# Patient Record
Sex: Female | Born: 1945
Health system: Southern US, Community
[De-identification: ages and names within clinical notes are randomized; demographics above are authoritative.]

## PROBLEM LIST (undated history)

## (undated) DIAGNOSIS — E039 Hypothyroidism, unspecified: Secondary | ICD-10-CM

## (undated) DIAGNOSIS — J309 Allergic rhinitis, unspecified: Secondary | ICD-10-CM

## (undated) DIAGNOSIS — K219 Gastro-esophageal reflux disease without esophagitis: Secondary | ICD-10-CM

## (undated) DIAGNOSIS — I4891 Unspecified atrial fibrillation: Secondary | ICD-10-CM

## (undated) DIAGNOSIS — E785 Hyperlipidemia, unspecified: Secondary | ICD-10-CM

## (undated) HISTORY — PX: TONSILLECTOMY: SUR1361

## (undated) HISTORY — PX: APPENDECTOMY: SHX54

## (undated) HISTORY — PX: ABLATION: SHX5711

## (undated) HISTORY — DX: Hyperlipidemia, unspecified: E78.5

## (undated) HISTORY — DX: Gastro-esophageal reflux disease without esophagitis: K21.9

## (undated) HISTORY — DX: Allergic rhinitis, unspecified: J30.9

## (undated) HISTORY — DX: Hypothyroidism, unspecified: E03.9

## (undated) HISTORY — PX: SHOULDER ARTHROSCOPY: SHX128

## (undated) HISTORY — PX: HERNIA REPAIR: SHX51

## (undated) HISTORY — PX: ADENOIDECTOMY: SUR15

## (undated) HISTORY — PX: OTHER SURGICAL HISTORY: SHX169

## (undated) HISTORY — DX: Unspecified atrial fibrillation: I48.91

---

## 2001-05-14 ENCOUNTER — Encounter: Payer: Self-pay | Admitting: Internal Medicine

## 2001-05-14 ENCOUNTER — Ambulatory Visit (HOSPITAL_COMMUNITY): Admission: RE | Admit: 2001-05-14 | Discharge: 2001-05-14 | Payer: Self-pay | Admitting: *Deleted

## 2001-09-13 ENCOUNTER — Ambulatory Visit (HOSPITAL_COMMUNITY): Admission: RE | Admit: 2001-09-13 | Discharge: 2001-09-13 | Payer: Self-pay | Admitting: *Deleted

## 2002-07-15 ENCOUNTER — Encounter: Payer: Self-pay | Admitting: Unknown Physician Specialty

## 2002-07-15 ENCOUNTER — Ambulatory Visit (HOSPITAL_COMMUNITY): Admission: RE | Admit: 2002-07-15 | Discharge: 2002-07-15 | Payer: Self-pay | Admitting: Family Medicine

## 2003-10-13 ENCOUNTER — Ambulatory Visit (HOSPITAL_COMMUNITY): Admission: RE | Admit: 2003-10-13 | Discharge: 2003-10-13 | Payer: Self-pay | Admitting: Family Medicine

## 2005-01-17 ENCOUNTER — Ambulatory Visit (HOSPITAL_COMMUNITY): Admission: RE | Admit: 2005-01-17 | Discharge: 2005-01-17 | Payer: Self-pay | Admitting: Family Medicine

## 2006-03-09 ENCOUNTER — Ambulatory Visit (HOSPITAL_COMMUNITY): Admission: RE | Admit: 2006-03-09 | Discharge: 2006-03-09 | Payer: Self-pay | Admitting: Family Medicine

## 2007-03-19 ENCOUNTER — Ambulatory Visit (HOSPITAL_COMMUNITY): Admission: RE | Admit: 2007-03-19 | Discharge: 2007-03-19 | Payer: Self-pay | Admitting: Family Medicine

## 2007-11-09 DIAGNOSIS — K5732 Diverticulitis of large intestine without perforation or abscess without bleeding: Secondary | ICD-10-CM | POA: Insufficient documentation

## 2008-03-31 ENCOUNTER — Ambulatory Visit (HOSPITAL_COMMUNITY): Admission: RE | Admit: 2008-03-31 | Discharge: 2008-03-31 | Payer: Self-pay | Admitting: Family Medicine

## 2009-04-22 ENCOUNTER — Ambulatory Visit (HOSPITAL_COMMUNITY): Admission: RE | Admit: 2009-04-22 | Discharge: 2009-04-22 | Payer: Self-pay | Admitting: Family Medicine

## 2010-05-03 ENCOUNTER — Ambulatory Visit (HOSPITAL_COMMUNITY): Admission: RE | Admit: 2010-05-03 | Discharge: 2010-05-03 | Payer: Self-pay | Admitting: Family Medicine

## 2011-01-07 ENCOUNTER — Encounter: Payer: Self-pay | Admitting: Family Medicine

## 2011-05-05 NOTE — Procedures (Signed)
[facility]  Patient:    Rebecca Carter, Rebecca Carter Visit Number: [id number] MRN: [id number]          Service Type: END Location: ENDO Attending Physician:  Sabino Gasser Dictated by:   Sabino Gasser, M.D. Admit Date:  09/13/2001                             Procedure Report  PROCEDURE:  Colonoscopy.  ENDOSCOPIST:  Sabino Gasser, M.D.  INDICATIONS:  Rectal bleeding, cancer screening.  ANESTHESIA:  Demerol 80, Versed 8 mg.  DESCRIPTION OF PROCEDURE:  With the patient mildly sedated in the left lateral decubitus position, the Olympus videoscopic colonoscope was inserted in the rectum and passed under direction vision into the cecum, identified by ileocecal valve and appendiceal orifice, both of which were photographed from this point.  The colonoscope was slowly withdrawn, taking circumferential views of the entire colonic mucosa, stopping in the rectum which appeared normal on direct view and showed internal hemorrhoids on retroflexed view. The endoscope was straightened and withdrawn.  The patients vital signs and pulse oximetry remained stable.  The patient tolerated procedure well without apparent complications.  FINDINGS:  Internal hemorrhoids, otherwise negative colonoscopic examination.  PLAN:  Repeat examination in 5-10 years. Dictated by:   Sabino Gasser, M.D. Attending Physician:  Sabino Gasser DD:  09/13/01 TD:  09/13/01 Job: [id number]

## 2011-05-29 ENCOUNTER — Other Ambulatory Visit (HOSPITAL_COMMUNITY): Payer: Self-pay | Admitting: Family Medicine

## 2011-05-29 DIAGNOSIS — Z1231 Encounter for screening mammogram for malignant neoplasm of breast: Secondary | ICD-10-CM

## 2011-06-07 ENCOUNTER — Ambulatory Visit (HOSPITAL_COMMUNITY): Payer: Self-pay

## 2011-07-03 ENCOUNTER — Other Ambulatory Visit (HOSPITAL_COMMUNITY): Payer: Self-pay | Admitting: Family Medicine

## 2011-07-03 DIAGNOSIS — Z1231 Encounter for screening mammogram for malignant neoplasm of breast: Secondary | ICD-10-CM

## 2011-07-11 ENCOUNTER — Ambulatory Visit (HOSPITAL_COMMUNITY)
Admission: RE | Admit: 2011-07-11 | Discharge: 2011-07-11 | Disposition: A | Discharge status: Home / Self Care | Payer: Medicare Other | Source: Ambulatory Visit | Attending: Family Medicine | Admitting: Family Medicine

## 2011-07-11 DIAGNOSIS — Z1231 Encounter for screening mammogram for malignant neoplasm of breast: Secondary | ICD-10-CM | POA: Insufficient documentation

## 2012-07-11 ENCOUNTER — Other Ambulatory Visit (HOSPITAL_COMMUNITY): Payer: Self-pay | Admitting: Family Medicine

## 2012-07-11 DIAGNOSIS — Z1231 Encounter for screening mammogram for malignant neoplasm of breast: Secondary | ICD-10-CM

## 2012-08-01 ENCOUNTER — Ambulatory Visit (HOSPITAL_COMMUNITY)
Admission: RE | Admit: 2012-08-01 | Discharge: 2012-08-01 | Disposition: A | Discharge status: Home / Self Care | Payer: Medicare Other | Source: Ambulatory Visit | Attending: Family Medicine | Admitting: Family Medicine

## 2012-08-01 DIAGNOSIS — Z1231 Encounter for screening mammogram for malignant neoplasm of breast: Secondary | ICD-10-CM

## 2013-08-04 ENCOUNTER — Other Ambulatory Visit (HOSPITAL_COMMUNITY): Payer: Self-pay | Admitting: Nurse Practitioner

## 2013-08-04 DIAGNOSIS — Z1231 Encounter for screening mammogram for malignant neoplasm of breast: Secondary | ICD-10-CM

## 2013-08-06 ENCOUNTER — Ambulatory Visit (HOSPITAL_COMMUNITY)
Admission: RE | Admit: 2013-08-06 | Discharge: 2013-08-06 | Disposition: A | Discharge status: Home / Self Care | Payer: Medicare Other | Source: Ambulatory Visit | Attending: Nurse Practitioner | Admitting: Nurse Practitioner

## 2013-08-06 DIAGNOSIS — Z1231 Encounter for screening mammogram for malignant neoplasm of breast: Secondary | ICD-10-CM | POA: Insufficient documentation

## 2013-08-22 ENCOUNTER — Telehealth: Payer: Self-pay | Admitting: Hematology & Oncology

## 2013-08-22 NOTE — Telephone Encounter (Signed)
Left pt message to call for appointment °

## 2013-08-22 NOTE — Telephone Encounter (Signed)
Pt aware of 09-22-13 appointment

## 2013-09-22 ENCOUNTER — Ambulatory Visit (HOSPITAL_BASED_OUTPATIENT_CLINIC_OR_DEPARTMENT_OTHER): Payer: Medicare Other | Admitting: Hematology & Oncology

## 2013-09-22 ENCOUNTER — Ambulatory Visit: Payer: Medicare Other

## 2013-09-22 ENCOUNTER — Ambulatory Visit (HOSPITAL_BASED_OUTPATIENT_CLINIC_OR_DEPARTMENT_OTHER): Payer: Medicare Other

## 2013-09-22 ENCOUNTER — Ambulatory Visit (HOSPITAL_BASED_OUTPATIENT_CLINIC_OR_DEPARTMENT_OTHER): Payer: Medicare Other | Admitting: Lab

## 2013-09-22 VITALS — BP 126/53 | HR 64 | Temp 98.2°F | Resp 14 | Ht 69.0 in | Wt 161.0 lb

## 2013-09-22 DIAGNOSIS — D72819 Decreased white blood cell count, unspecified: Secondary | ICD-10-CM

## 2013-09-22 DIAGNOSIS — D696 Thrombocytopenia, unspecified: Secondary | ICD-10-CM

## 2013-09-22 DIAGNOSIS — Z23 Encounter for immunization: Secondary | ICD-10-CM

## 2013-09-22 LAB — CBC WITH DIFFERENTIAL (CANCER CENTER ONLY)
BASO#: 0 10*3/uL (ref 0.0–0.2)
Eosinophils Absolute: 0 10*3/uL (ref 0.0–0.5)
HGB: 13.3 g/dL (ref 11.6–15.9)
MCH: 32.3 pg (ref 26.0–34.0)
MONO%: 10.8 % (ref 0.0–13.0)
NEUT#: 3.9 10*3/uL (ref 1.5–6.5)
RBC: 4.12 10*6/uL (ref 3.70–5.32)

## 2013-09-22 MED ORDER — INFLUENZA VAC SPLIT QUAD 0.5 ML IM SUSP
0.5000 mL | Freq: Once | INTRAMUSCULAR | Status: AC
  Administered 2013-09-22: 0.5 mL via INTRAMUSCULAR
  Filled 2013-09-22: qty 0.5

## 2013-09-22 NOTE — Progress Notes (Signed)
This office note has been dictated.

## 2013-09-23 NOTE — Progress Notes (Signed)
CC:   Duayne Cal, FNP, Fax [phone]  DIAGNOSIS:  Transient leukopenia and thrombocytopenia.  HISTORY OF PRESENT ILLNESS:  Ms. Ransome is very nice 67 year old white female.  She has been in pretty decent health.  She has history of hypothyroidism.  There is also a history of hyperlipidemia and hypertension.  She worked as a Academic librarian for 30 years.  She does have some anxiety.  She is followed at [facility] in [place].  She had some routine lab work done.  This was done in August.  The lab work showed that she had some leukopenia and thrombocytopenia.  CBC showed a white cell count of 3, hemoglobin of 12.2, hematocrit 36.1, and platelet count was 134.  She had white cell differential with 44% segs, 44% lymphs, 10 monos.  Because of this, her nurse practitioner felt that a hematologic evaluation was needed.  Ms. __________ says that there has been no change in her medications. She has had no problem with infections.  She has had no rashes.  She is not a vegetarian.  Her appetite has been good.  There has been no weight loss or weight gain.  She has had no change in bowel or bladder habits.  She has been getting her routine mammograms.  There have been no mouth sores.  There have been no visual issues.  Overall, she has been very healthy.  PAST MEDICAL HISTORY:  Remarkable for: 1. Hypothyroidism. 2. Hypertension. 3. Hyperlipidemia. 4. Anxiety. 5. I think, allergic rhinitis. 6. Appendectomy. 7. Right inguinal hernia repair.  ALLERGIES:  Sulfa antibiotics.  MEDICATIONS:  Cymbalta 30 mg p.o. q. day, Synthroid 0.05 mg p.o. q. day, Zebeta 7.5 mg p.o. q. day, Lexapro 10 mg p.o. q. day, Ambien 10 mg p.o. q.h.s., Lipitor 20 mg p.o. q. day, Ativan 0.5 mg p.o. b.i.d. p.r.n., vitamin D 2000 units p.o. q. day, and Prilosec 20 mg p.o. q. day.  SOCIAL HISTORY:  Negative for tobacco use.  She has rare alcohol use. There are no obvious  occupational exposures.  FAMILY HISTORY:  Remarkable for a sister who died of ovarian cancer in her 74s.  Her father had pancreatic cancer, who passed away from this. An uncle had stomach cancer.  There is also a history of hypothyroidism.  REVIEW OF SYSTEMS:  As stated in the history of present illness.  No additional findings are noted on a 12-system review.  PHYSICAL EXAMINATION:  General:  This is a well-developed, well- nourished white female in no obvious distress.  Vital signs: Temperature of 98.2, pulse 64, respiratory rate 18, blood pressure 126/53.  Weight is 161 pounds.  Head and neck:  Normocephalic, atraumatic skull.  There are no ocular or oral lesions.  There are no palpable cervical or supraclavicular lymph nodes.  Lungs:  Clear to percussion and auscultation bilaterally.  Cardiac:  Regular rate and rhythm with a normal S1 and S2.  There are no murmurs, rubs, or bruits. Abdomen:  Soft.  She has good bowel sounds.  There is no fluid wave. There is no palpable hepatosplenomegaly.  Axillary:  No bilateral axillary adenopathy.  Back:  No tenderness over the spine, ribs, or hips.  Extremities:  No clubbing, cyanosis, or edema.  She has good range of motion of her joints.  She has good muscle strength in her upper and lower extremities.  Skin:  No rashes, ecchymoses, or petechiae.  Neurological:  No focal neurological deficits.  LABORATORY STUDIES:  White cell count is 5.9,  hemoglobin 13.3, hematocrit 38.7, platelet count 182.  MCV is 96.  White cell differential shows 66 segs, 23 lymphs, 11 monos.  Peripheral smear shows a normochromic, normocytic population of red blood cells.  There are no nucleated red blood cells.  There are no teardrop cells.  There are no target cells.  I see no spherocytes or schistocytes.  White cells appear normal in morphology and maturation. I see no hypersegmented polys.  There are no immature myeloid or lymphoid forms.  There are no blasts.   Platelets are adequate in number and size.  IMPRESSION:  Ms. Dack is a very nice 67 year old white female.  She had transient leukopenia and thrombocytopenia.  Today, her blood counts are normal.  I wonder if her blood counts may not fluctuate secondary to her medications.  I suppose this always could be a possibility.  I saw nothing on her physical exam and on the blood smear that would suggest an underlying hematologic issue.  I do not see anything that would look like a bone marrow disorder.  I do not believe that any additional evaluation is needed.  I do not see that we would have to do a bone marrow test on her.  I think this would be very low yield.  Again, one would have to suspect that Ms. __________ will have some fluctuations of her blood counts, given the medications that she is taking.  However, I would not change these as her blood counts really are not that affected.  I spent a good hour with Ms. Schier and her husband.  I forgot to mention that she did have a flu shot today.  I did go ahead and give her a prayer blanket.  This made her feel a whole lot better.  I do not think we have to get Ms. Kuwahara back to the office.  Again, I think her blood counts will fluctuate on occasion secondary to her medications.    ______________________________ Josph Macho, M.D. PRE/MEDQ  D:  09/22/2013  T:  09/23/2013  Job:  [id number]

## 2014-03-19 DIAGNOSIS — D709 Neutropenia, unspecified: Secondary | ICD-10-CM | POA: Insufficient documentation

## 2014-03-19 DIAGNOSIS — E039 Hypothyroidism, unspecified: Secondary | ICD-10-CM | POA: Insufficient documentation

## 2014-03-31 DIAGNOSIS — G4720 Circadian rhythm sleep disorder, unspecified type: Secondary | ICD-10-CM | POA: Insufficient documentation

## 2014-03-31 DIAGNOSIS — F064 Anxiety disorder due to known physiological condition: Secondary | ICD-10-CM | POA: Insufficient documentation

## 2014-03-31 DIAGNOSIS — E7800 Pure hypercholesterolemia, unspecified: Secondary | ICD-10-CM | POA: Insufficient documentation

## 2014-03-31 DIAGNOSIS — E785 Hyperlipidemia, unspecified: Secondary | ICD-10-CM | POA: Insufficient documentation

## 2014-06-23 DIAGNOSIS — M75102 Unspecified rotator cuff tear or rupture of left shoulder, not specified as traumatic: Secondary | ICD-10-CM | POA: Insufficient documentation

## 2014-06-23 DIAGNOSIS — S43439A Superior glenoid labrum lesion of unspecified shoulder, initial encounter: Secondary | ICD-10-CM | POA: Insufficient documentation

## 2014-06-23 DIAGNOSIS — M19019 Primary osteoarthritis, unspecified shoulder: Secondary | ICD-10-CM | POA: Insufficient documentation

## 2014-10-07 DIAGNOSIS — R5383 Other fatigue: Secondary | ICD-10-CM | POA: Insufficient documentation

## 2015-02-24 IMAGING — MG MM DIGITAL SCREENING BILAT W/ CAD
12 series · 12 of 28 positions shown · non-contrast
Comparison: Previous exam(s).

CLINICAL DATA: Screening.

DIGITAL SCREENING BILATERAL MAMMOGRAM WITH CAD
DIGITAL BREAST TOMOSYNTHESIS
Digital breast tomosynthesis images are acquired in two
projections.  These images are reviewed in combination with the
digital mammogram, confirming the findings below.

[L CC]
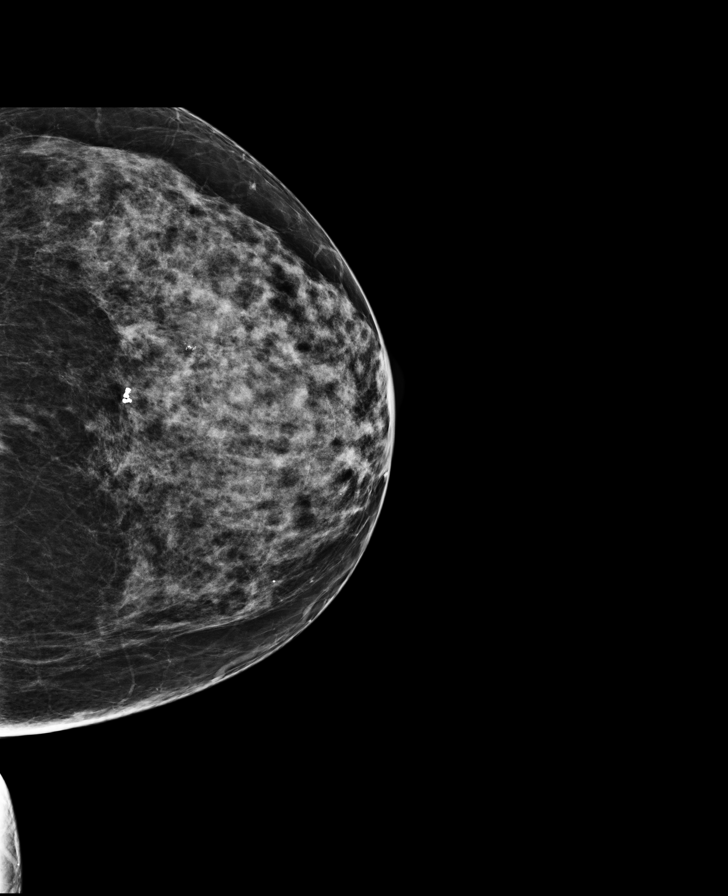

[R MLO]
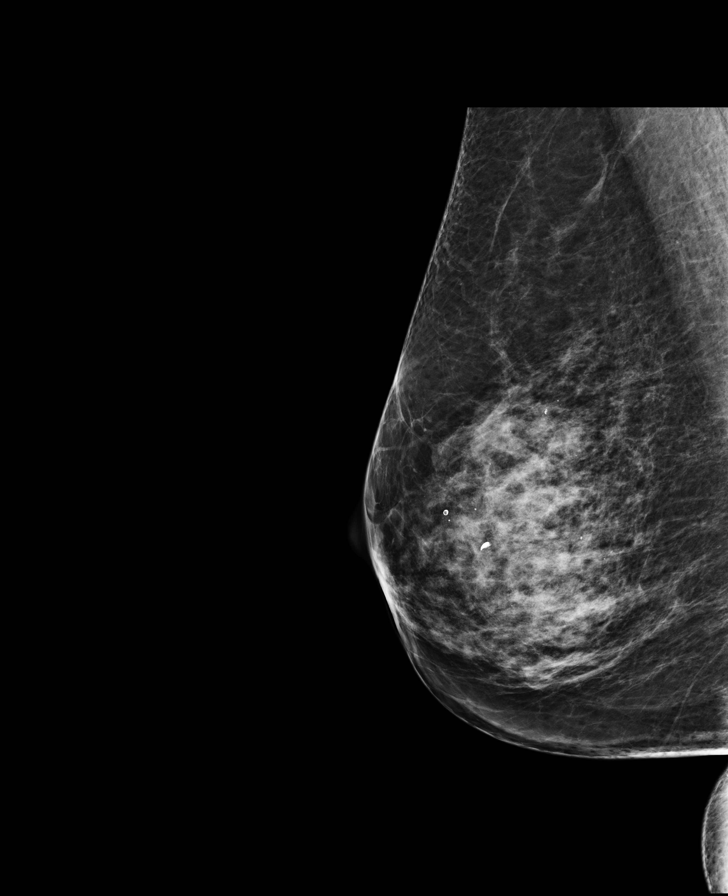

[R CC]
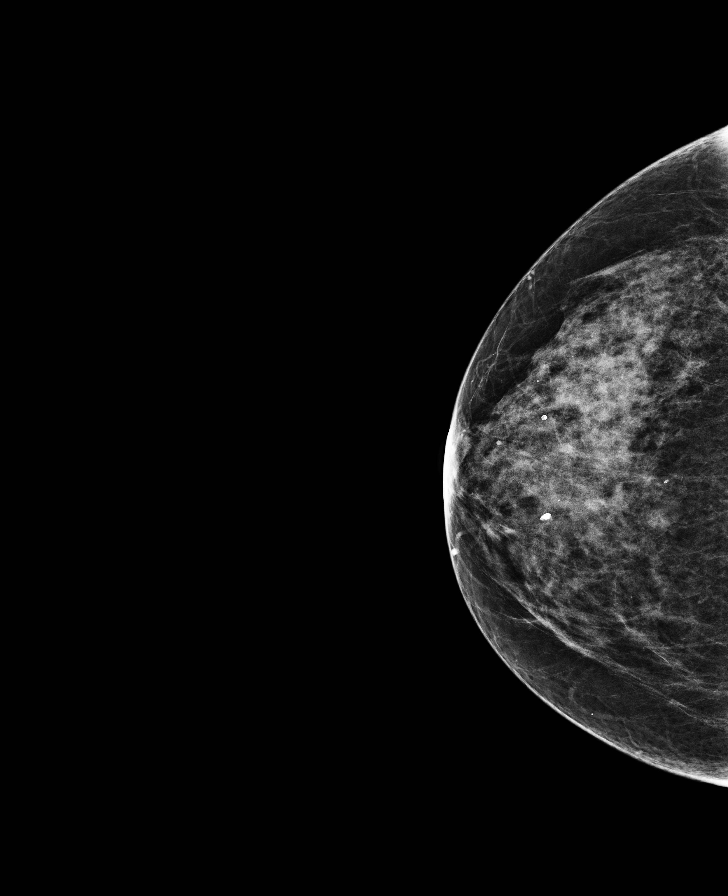

[L MLO]
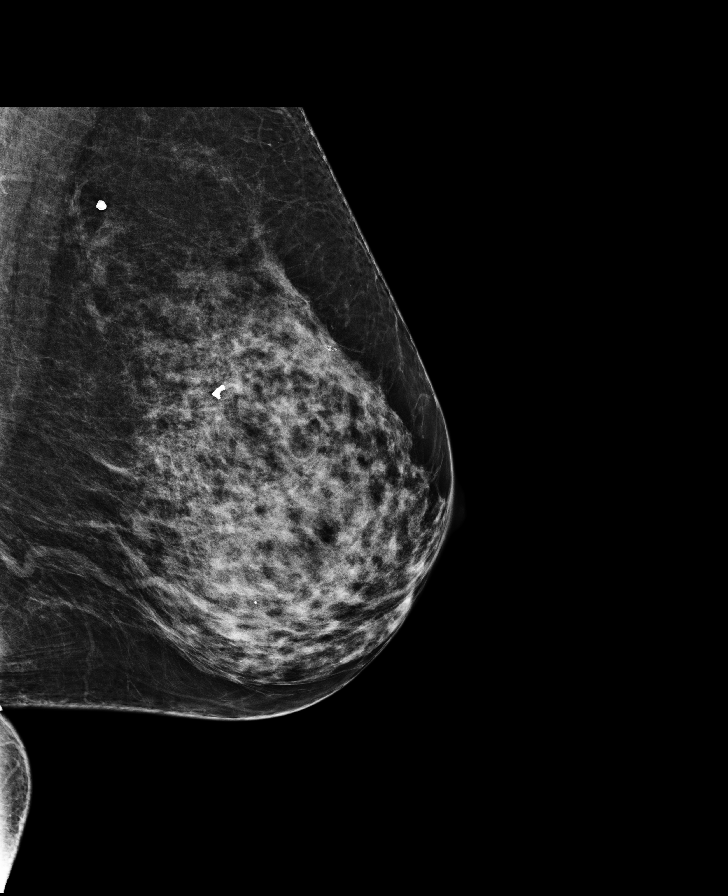

[L CC tomo (1 of 2)]
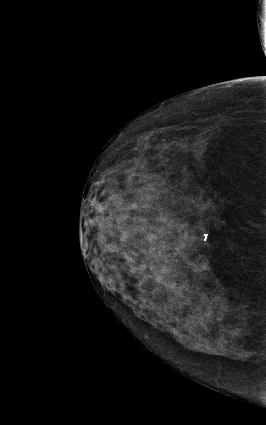

[L MLO tomo (1 of 2)]
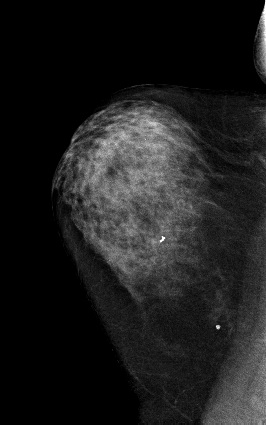

[R CC tomo (1 of 2)]
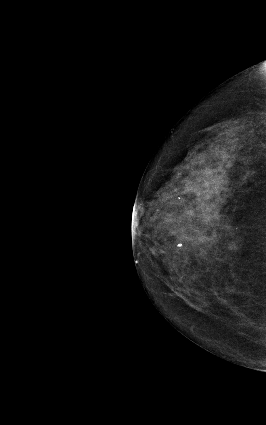

[R MLO tomo (1 of 2)]
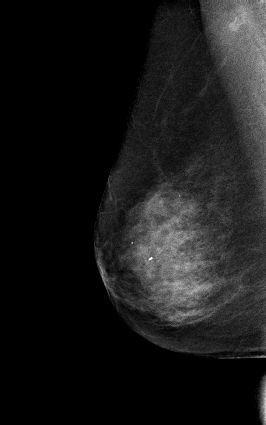

[R CC tomo (2 of 2) · tomo slice 33/66.0]
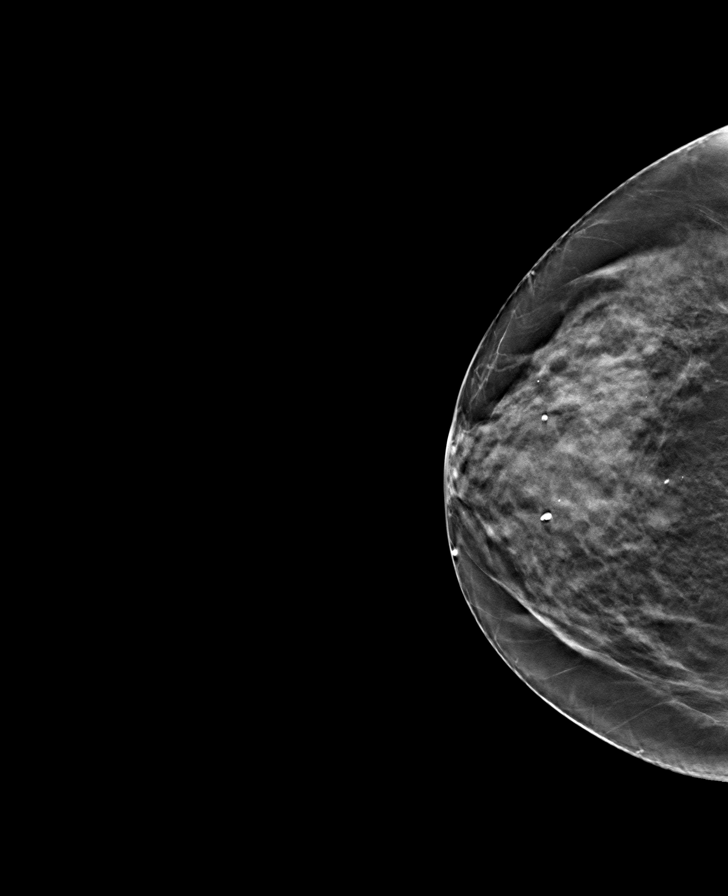

[R MLO tomo (2 of 2) · tomo slice 37/72.0]
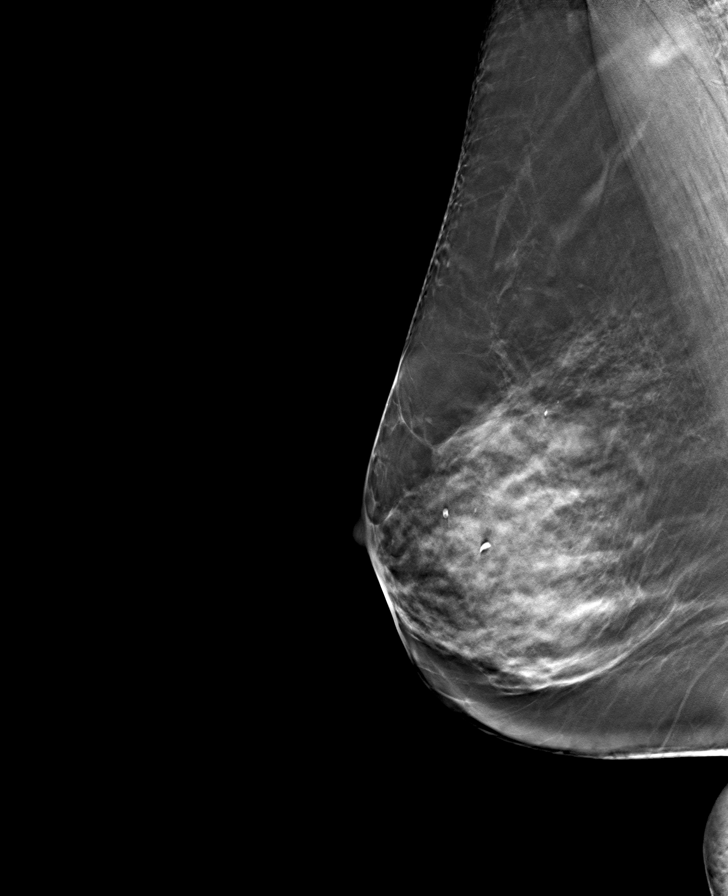

[L CC tomo (2 of 2) · tomo slice 34/67.0]
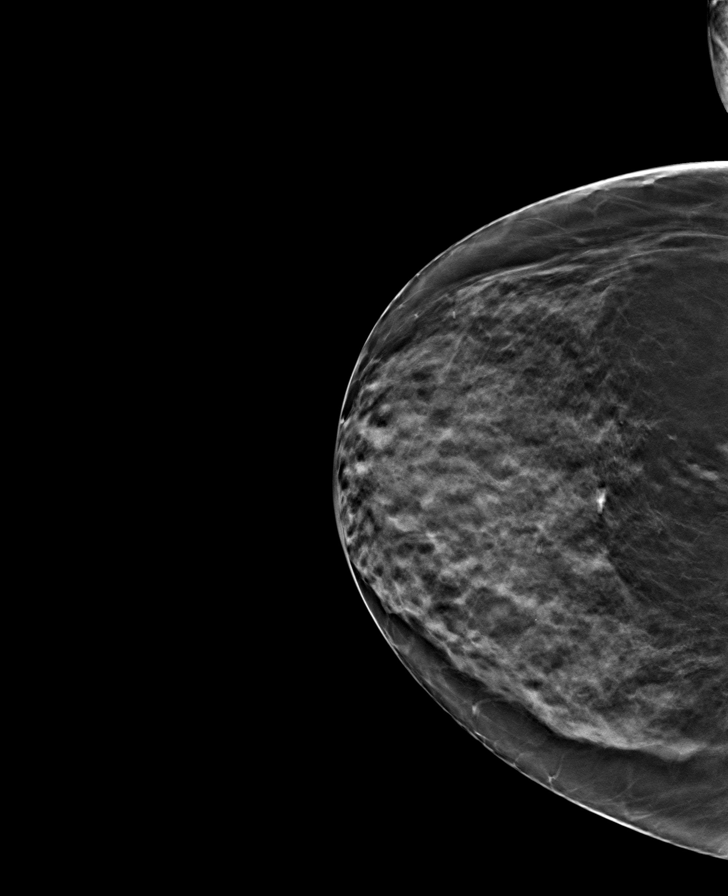

[L MLO tomo (2 of 2) · tomo slice 37/72.0]
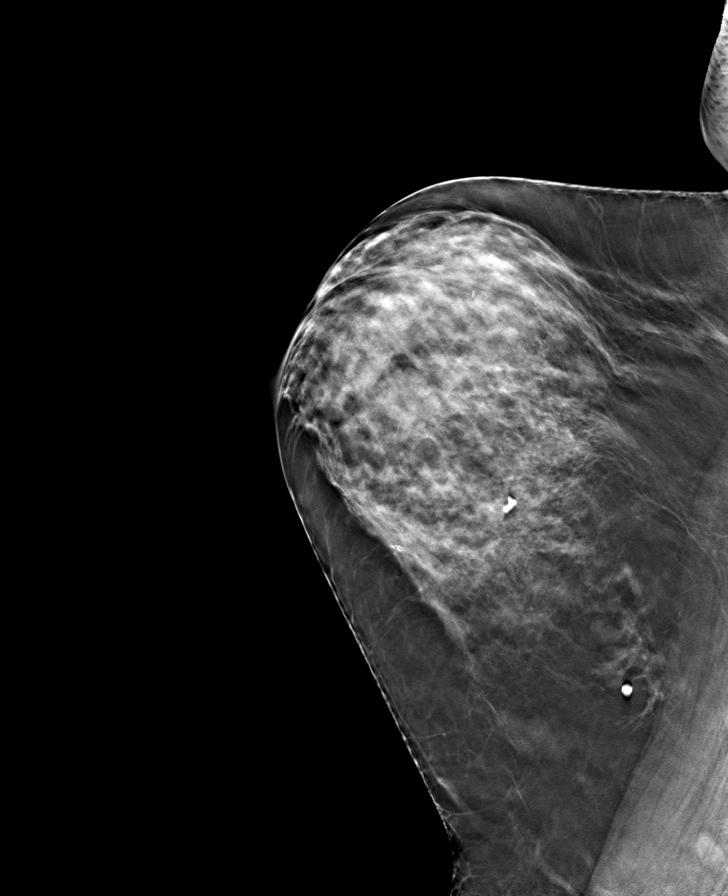

[12 of 28 positions shown; findings below may reference images not displayed]

FINDINGS: ACR Breast Density Category c:  The breast tissue is
heterogeneously dense, which may obscure small masses.

There are no findings suspicious for malignancy.

Images were processed with CAD.
IMPRESSION: No mammographic evidence of malignancy.

A result letter of this screening mammogram will be mailed directly
to the patient.

RECOMMENDATION:
Screening mammogram in one year. (Code:HG-A-CVJ)

BI-RADS CATEGORY 1:  Negative.

## 2015-05-27 DIAGNOSIS — R002 Palpitations: Secondary | ICD-10-CM | POA: Insufficient documentation

## 2015-06-17 DIAGNOSIS — M7662 Achilles tendinitis, left leg: Secondary | ICD-10-CM | POA: Insufficient documentation

## 2015-06-17 DIAGNOSIS — M7730 Calcaneal spur, unspecified foot: Secondary | ICD-10-CM | POA: Insufficient documentation

## 2015-08-10 DIAGNOSIS — F5104 Psychophysiologic insomnia: Secondary | ICD-10-CM | POA: Insufficient documentation

## 2015-09-15 ENCOUNTER — Ambulatory Visit (INDEPENDENT_AMBULATORY_CARE_PROVIDER_SITE_OTHER): Payer: Medicare Other

## 2015-09-15 DIAGNOSIS — J309 Allergic rhinitis, unspecified: Secondary | ICD-10-CM | POA: Diagnosis not present

## 2015-09-23 DIAGNOSIS — M7660 Achilles tendinitis, unspecified leg: Secondary | ICD-10-CM | POA: Insufficient documentation

## 2015-09-24 DIAGNOSIS — I480 Paroxysmal atrial fibrillation: Secondary | ICD-10-CM | POA: Insufficient documentation

## 2015-09-29 ENCOUNTER — Ambulatory Visit (INDEPENDENT_AMBULATORY_CARE_PROVIDER_SITE_OTHER): Payer: Medicare Other

## 2015-09-29 DIAGNOSIS — J309 Allergic rhinitis, unspecified: Secondary | ICD-10-CM | POA: Diagnosis not present

## 2015-10-14 ENCOUNTER — Ambulatory Visit (INDEPENDENT_AMBULATORY_CARE_PROVIDER_SITE_OTHER): Payer: Medicare Other

## 2015-10-14 DIAGNOSIS — J309 Allergic rhinitis, unspecified: Secondary | ICD-10-CM | POA: Diagnosis not present

## 2015-10-21 ENCOUNTER — Ambulatory Visit (INDEPENDENT_AMBULATORY_CARE_PROVIDER_SITE_OTHER): Payer: Medicare Other | Admitting: *Deleted

## 2015-10-21 DIAGNOSIS — J309 Allergic rhinitis, unspecified: Secondary | ICD-10-CM | POA: Diagnosis not present

## 2015-10-28 ENCOUNTER — Ambulatory Visit (INDEPENDENT_AMBULATORY_CARE_PROVIDER_SITE_OTHER): Payer: Medicare Other | Admitting: *Deleted

## 2015-10-28 DIAGNOSIS — J309 Allergic rhinitis, unspecified: Secondary | ICD-10-CM

## 2015-11-04 ENCOUNTER — Ambulatory Visit (INDEPENDENT_AMBULATORY_CARE_PROVIDER_SITE_OTHER): Payer: Medicare Other | Admitting: *Deleted

## 2015-11-04 DIAGNOSIS — J309 Allergic rhinitis, unspecified: Secondary | ICD-10-CM | POA: Diagnosis not present

## 2015-11-10 ENCOUNTER — Ambulatory Visit (INDEPENDENT_AMBULATORY_CARE_PROVIDER_SITE_OTHER): Payer: Medicare Other

## 2015-11-10 DIAGNOSIS — J309 Allergic rhinitis, unspecified: Secondary | ICD-10-CM

## 2015-11-17 DIAGNOSIS — Z9889 Other specified postprocedural states: Secondary | ICD-10-CM | POA: Insufficient documentation

## 2015-11-24 ENCOUNTER — Ambulatory Visit (INDEPENDENT_AMBULATORY_CARE_PROVIDER_SITE_OTHER): Payer: Medicare Other | Admitting: *Deleted

## 2015-11-24 DIAGNOSIS — J309 Allergic rhinitis, unspecified: Secondary | ICD-10-CM | POA: Diagnosis not present

## 2015-12-15 ENCOUNTER — Ambulatory Visit (INDEPENDENT_AMBULATORY_CARE_PROVIDER_SITE_OTHER): Payer: Medicare Other | Admitting: *Deleted

## 2015-12-15 DIAGNOSIS — J309 Allergic rhinitis, unspecified: Secondary | ICD-10-CM | POA: Diagnosis not present

## 2015-12-23 ENCOUNTER — Ambulatory Visit (INDEPENDENT_AMBULATORY_CARE_PROVIDER_SITE_OTHER): Payer: Medicare Other | Admitting: *Deleted

## 2015-12-23 DIAGNOSIS — J309 Allergic rhinitis, unspecified: Secondary | ICD-10-CM

## 2015-12-24 ENCOUNTER — Ambulatory Visit: Payer: Medicare Other | Admitting: Internal Medicine

## 2015-12-27 ENCOUNTER — Ambulatory Visit: Payer: Medicare Other | Admitting: Internal Medicine

## 2015-12-30 ENCOUNTER — Ambulatory Visit (INDEPENDENT_AMBULATORY_CARE_PROVIDER_SITE_OTHER): Payer: Medicare Other | Admitting: *Deleted

## 2015-12-30 DIAGNOSIS — J309 Allergic rhinitis, unspecified: Secondary | ICD-10-CM | POA: Diagnosis not present

## 2016-01-11 ENCOUNTER — Ambulatory Visit (INDEPENDENT_AMBULATORY_CARE_PROVIDER_SITE_OTHER): Payer: Medicare Other | Admitting: *Deleted

## 2016-01-11 DIAGNOSIS — J309 Allergic rhinitis, unspecified: Secondary | ICD-10-CM | POA: Diagnosis not present

## 2016-01-13 DIAGNOSIS — J301 Allergic rhinitis due to pollen: Secondary | ICD-10-CM | POA: Diagnosis not present

## 2016-01-14 DIAGNOSIS — J3089 Other allergic rhinitis: Secondary | ICD-10-CM | POA: Diagnosis not present

## 2016-01-27 ENCOUNTER — Ambulatory Visit (INDEPENDENT_AMBULATORY_CARE_PROVIDER_SITE_OTHER): Payer: Medicare Other

## 2016-01-27 DIAGNOSIS — J309 Allergic rhinitis, unspecified: Secondary | ICD-10-CM | POA: Diagnosis not present

## 2016-02-04 ENCOUNTER — Encounter: Payer: Self-pay | Admitting: Internal Medicine

## 2016-02-04 ENCOUNTER — Ambulatory Visit (INDEPENDENT_AMBULATORY_CARE_PROVIDER_SITE_OTHER): Payer: Medicare Other | Admitting: Internal Medicine

## 2016-02-04 ENCOUNTER — Telehealth: Payer: Self-pay | Admitting: Internal Medicine

## 2016-02-04 VITALS — BP 130/62 | HR 64 | Temp 98.3°F | Resp 16 | Ht 69.5 in | Wt 161.8 lb

## 2016-02-04 DIAGNOSIS — J309 Allergic rhinitis, unspecified: Secondary | ICD-10-CM | POA: Insufficient documentation

## 2016-02-04 DIAGNOSIS — J3089 Other allergic rhinitis: Secondary | ICD-10-CM | POA: Diagnosis not present

## 2016-02-04 MED ORDER — FEXOFENADINE HCL 180 MG PO TABS: 180.0000 mg | ORAL_TABLET | Freq: Every day | ORAL | Status: DC

## 2016-02-04 MED ORDER — EPINEPHRINE 0.3 MG/0.3ML IJ SOAJ: 0.3000 mg | Freq: Once | INTRAMUSCULAR | Status: DC

## 2016-02-04 MED ORDER — AZELASTINE HCL 0.1 % NA SOLN: 2.0000 | Freq: Two times a day (BID) | NASAL | Status: DC | PRN

## 2016-02-04 NOTE — Patient Instructions (Signed)
Allergic rhinitis  On immunotherapy  Use Astelin 2 sprays each nostril twice a day as needed  Updated EpiPen and action plan-educated on use  May use Allegra (fexofenadine) 180 mg daily

## 2016-02-04 NOTE — Progress Notes (Signed)
History of Present Illness: Rebecca Carter is a 70 y.o. female presenting for follow-up.  HPI Comments: Allergic rhinitis on immunotherapy: Start March 2012, maintenance reached 2013. He has a large local reactions, her maintenance dose had been frozen. She is now on 0.5 ML of the 1:100 dilution and doing well. She is having good symptom control but does report that whenever she is in the cold weather, has rhinorrhea. She has not had any interval sinus infections or need for antibiotics.  Of note, patient was diagnosed with atrial fibrillation since her last visit. She underwent ablation last November and is on an antiarrhythmic agent and an anticoagulant.   Assessment and Plan: Allergic rhinitis  On immunotherapy  Use Astelin 2 sprays each nostril twice a day as needed  Updated EpiPen and action plan-educated on use  May use Allegra (fexofenadine) 180 mg daily  Patient was counseled that beta blockers may attenuate the effects of epinephrine     Return in about 1 year (around 02/03/2017).  Medications ordered this encounter:  Meds ordered this encounter  Medications  . edoxaban (SAVAYSA) 60 MG TABS tablet    Sig: Take 60 mg by mouth daily.  . meloxicam (MOBIC) 7.5 MG tablet    Sig: Take 7.5 mg by mouth daily.  . sotalol (BETAPACE) 80 MG tablet    Sig: Take 80 mg by mouth 2 (two) times daily.  Marland Kitchen azelastine (ASTELIN) 0.1 % nasal spray    Sig: Place 2 sprays into both nostrils 2 (two) times daily as needed for rhinitis. Use in each nostril as directed    Dispense:  30 mL    Refill:  5  . EPINEPHrine (EPIPEN 2-PAK) 0.3 mg/0.3 mL IJ SOAJ injection    Sig: Inject 0.3 mLs (0.3 mg total) into the muscle once.    Dispense:  2 Device    Refill:  2  . fexofenadine (ALLEGRA) 180 MG tablet    Sig: Take 1 tablet (180 mg total) by mouth daily.    Dispense:  30 tablet    Refill:  5   Physical Exam: BP 130/62 mmHg  Pulse 64  Temp(Src) 98.3 F (36.8 C) (Tympanic)  Resp 16  Ht 5'  9.5" (1.765 m)  Wt 161 lb 13.1 oz (73.4 kg)  BMI 23.56 kg/m2   Physical Exam  Constitutional: She appears well-developed and well-nourished. No distress.  HENT:  Right Ear: External ear normal.  Left Ear: External ear normal.  Nose: Nose normal.  Mouth/Throat: Oropharynx is clear and moist.  Eyes: Conjunctivae are normal. Right eye exhibits no discharge. Left eye exhibits no discharge.  Cardiovascular: Normal rate, regular rhythm and normal heart sounds.   No murmur heard. Pulmonary/Chest: Effort normal and breath sounds normal. No respiratory distress. She has no wheezes. She has no rales.  Abdominal: Soft. Bowel sounds are normal.  Musculoskeletal: She exhibits no edema.  Lymphadenopathy:    She has no cervical adenopathy.  Neurological: She is alert.  Skin: No rash noted.  Vitals reviewed.   Medications: Current outpatient prescriptions:  .  atorvastatin (LIPITOR) 20 MG tablet, Take 20 mg by mouth daily., Disp: , Rfl:  .  azelastine (ASTELIN) 0.1 % nasal spray, Place 2 sprays into both nostrils 2 (two) times daily as needed for rhinitis. Use in each nostril as directed, Disp: 30 mL, Rfl: 5 .  cholecalciferol (VITAMIN D) 1000 UNITS tablet, Take 1,000 Units by mouth daily., Disp: , Rfl:  .  edoxaban (SAVAYSA) 60 MG TABS tablet,  Take 60 mg by mouth daily., Disp: , Rfl:  .  escitalopram (LEXAPRO) 20 MG tablet, Take 20 mg by mouth daily., Disp: , Rfl:  .  levothyroxine (SYNTHROID, LEVOTHROID) 50 MCG tablet, Take 50 mcg by mouth daily before breakfast., Disp: , Rfl:  .  LORazepam (ATIVAN) 0.5 MG tablet, Take 0.5 mg by mouth as needed for anxiety., Disp: , Rfl:  .  meloxicam (MOBIC) 7.5 MG tablet, Take 7.5 mg by mouth daily., Disp: , Rfl:  .  omeprazole (PRILOSEC) 20 MG capsule, Take 20 mg by mouth daily., Disp: , Rfl:  .  sotalol (BETAPACE) 80 MG tablet, Take 80 mg by mouth 2 (two) times daily., Disp: , Rfl:  .  zolpidem (AMBIEN) 10 MG tablet, Take 10 mg by mouth at bedtime as needed  for sleep., Disp: , Rfl:  .  bisoprolol (ZEBETA) 5 MG tablet, Take 5 mg by mouth daily. Reported on 02/04/2016, Disp: , Rfl:  .  EPINEPHrine (EPIPEN 2-PAK) 0.3 mg/0.3 mL IJ SOAJ injection, Inject 0.3 mLs (0.3 mg total) into the muscle once., Disp: 2 Device, Rfl: 2 .  fexofenadine (ALLEGRA) 180 MG tablet, Take 1 tablet (180 mg total) by mouth daily., Disp: 30 tablet, Rfl: 5  Drug Allergies:  Allergies  Allergen Reactions  . Sulfa Antibiotics     rash  . Latex     Other reaction(s): RASH    ROS: Per HPI unless specifically indicated below Review of Systems  Thank you for the opportunity to care for this patient.  Please do not hesitate to contact me with questions.

## 2016-02-04 NOTE — Telephone Encounter (Signed)
Sending printouts

## 2016-02-04 NOTE — Telephone Encounter (Signed)
Patient is asking for a detailed statement of her visits for last year to be mailed to her for mileage purposes.

## 2016-02-04 NOTE — Assessment & Plan Note (Addendum)
   On immunotherapy  Use Astelin 2 sprays each nostril twice a day as needed  Updated EpiPen and action plan-educated on use  May use Allegra (fexofenadine) 180 mg daily  Patient was counseled that beta blockers may attenuate the effects of epinephrine

## 2016-02-09 ENCOUNTER — Ambulatory Visit (INDEPENDENT_AMBULATORY_CARE_PROVIDER_SITE_OTHER): Payer: Medicare Other

## 2016-02-09 DIAGNOSIS — J309 Allergic rhinitis, unspecified: Secondary | ICD-10-CM | POA: Diagnosis not present

## 2016-02-11 ENCOUNTER — Encounter: Payer: Self-pay | Admitting: *Deleted

## 2016-02-23 ENCOUNTER — Ambulatory Visit (INDEPENDENT_AMBULATORY_CARE_PROVIDER_SITE_OTHER): Payer: Medicare Other

## 2016-02-23 DIAGNOSIS — J309 Allergic rhinitis, unspecified: Secondary | ICD-10-CM | POA: Diagnosis not present

## 2016-03-01 ENCOUNTER — Ambulatory Visit (INDEPENDENT_AMBULATORY_CARE_PROVIDER_SITE_OTHER): Payer: Medicare Other

## 2016-03-01 DIAGNOSIS — J309 Allergic rhinitis, unspecified: Secondary | ICD-10-CM | POA: Diagnosis not present

## 2016-03-09 ENCOUNTER — Ambulatory Visit (INDEPENDENT_AMBULATORY_CARE_PROVIDER_SITE_OTHER): Payer: Medicare Other | Admitting: *Deleted

## 2016-03-09 DIAGNOSIS — J309 Allergic rhinitis, unspecified: Secondary | ICD-10-CM | POA: Diagnosis not present

## 2016-03-15 ENCOUNTER — Ambulatory Visit (INDEPENDENT_AMBULATORY_CARE_PROVIDER_SITE_OTHER): Payer: Medicare Other

## 2016-03-15 DIAGNOSIS — J309 Allergic rhinitis, unspecified: Secondary | ICD-10-CM

## 2016-03-21 ENCOUNTER — Ambulatory Visit (INDEPENDENT_AMBULATORY_CARE_PROVIDER_SITE_OTHER): Payer: Medicare Other

## 2016-03-21 DIAGNOSIS — J309 Allergic rhinitis, unspecified: Secondary | ICD-10-CM | POA: Diagnosis not present

## 2016-03-30 ENCOUNTER — Ambulatory Visit (INDEPENDENT_AMBULATORY_CARE_PROVIDER_SITE_OTHER): Payer: Medicare Other | Admitting: *Deleted

## 2016-03-30 DIAGNOSIS — J309 Allergic rhinitis, unspecified: Secondary | ICD-10-CM | POA: Diagnosis not present

## 2016-04-12 ENCOUNTER — Ambulatory Visit (INDEPENDENT_AMBULATORY_CARE_PROVIDER_SITE_OTHER): Payer: Medicare Other | Admitting: *Deleted

## 2016-04-12 DIAGNOSIS — J309 Allergic rhinitis, unspecified: Secondary | ICD-10-CM | POA: Diagnosis not present

## 2016-04-24 ENCOUNTER — Ambulatory Visit (INDEPENDENT_AMBULATORY_CARE_PROVIDER_SITE_OTHER): Payer: Medicare Other

## 2016-04-24 DIAGNOSIS — J309 Allergic rhinitis, unspecified: Secondary | ICD-10-CM

## 2016-05-08 ENCOUNTER — Ambulatory Visit (INDEPENDENT_AMBULATORY_CARE_PROVIDER_SITE_OTHER): Payer: Medicare Other

## 2016-05-08 DIAGNOSIS — J309 Allergic rhinitis, unspecified: Secondary | ICD-10-CM

## 2016-05-18 DIAGNOSIS — J301 Allergic rhinitis due to pollen: Secondary | ICD-10-CM | POA: Diagnosis not present

## 2016-05-19 DIAGNOSIS — J3089 Other allergic rhinitis: Secondary | ICD-10-CM | POA: Diagnosis not present

## 2016-05-23 ENCOUNTER — Ambulatory Visit (INDEPENDENT_AMBULATORY_CARE_PROVIDER_SITE_OTHER): Payer: Medicare Other

## 2016-05-23 DIAGNOSIS — J309 Allergic rhinitis, unspecified: Secondary | ICD-10-CM

## 2016-06-06 ENCOUNTER — Ambulatory Visit (INDEPENDENT_AMBULATORY_CARE_PROVIDER_SITE_OTHER): Payer: Medicare Other

## 2016-06-06 DIAGNOSIS — J309 Allergic rhinitis, unspecified: Secondary | ICD-10-CM

## 2016-06-22 ENCOUNTER — Ambulatory Visit (INDEPENDENT_AMBULATORY_CARE_PROVIDER_SITE_OTHER): Payer: Medicare Other

## 2016-06-22 DIAGNOSIS — J309 Allergic rhinitis, unspecified: Secondary | ICD-10-CM

## 2016-07-05 ENCOUNTER — Ambulatory Visit (INDEPENDENT_AMBULATORY_CARE_PROVIDER_SITE_OTHER): Payer: Medicare Other

## 2016-07-05 DIAGNOSIS — J309 Allergic rhinitis, unspecified: Secondary | ICD-10-CM

## 2016-07-20 ENCOUNTER — Ambulatory Visit (INDEPENDENT_AMBULATORY_CARE_PROVIDER_SITE_OTHER): Payer: Medicare Other

## 2016-07-20 DIAGNOSIS — J309 Allergic rhinitis, unspecified: Secondary | ICD-10-CM

## 2016-07-20 DIAGNOSIS — K219 Gastro-esophageal reflux disease without esophagitis: Secondary | ICD-10-CM | POA: Insufficient documentation

## 2016-08-02 ENCOUNTER — Ambulatory Visit (INDEPENDENT_AMBULATORY_CARE_PROVIDER_SITE_OTHER): Payer: Medicare Other

## 2016-08-02 DIAGNOSIS — J309 Allergic rhinitis, unspecified: Secondary | ICD-10-CM | POA: Diagnosis not present

## 2016-08-17 ENCOUNTER — Ambulatory Visit (INDEPENDENT_AMBULATORY_CARE_PROVIDER_SITE_OTHER): Payer: Medicare Other

## 2016-08-17 DIAGNOSIS — J309 Allergic rhinitis, unspecified: Secondary | ICD-10-CM | POA: Diagnosis not present

## 2016-08-30 ENCOUNTER — Ambulatory Visit (INDEPENDENT_AMBULATORY_CARE_PROVIDER_SITE_OTHER): Payer: Medicare Other | Admitting: *Deleted

## 2016-08-30 DIAGNOSIS — J309 Allergic rhinitis, unspecified: Secondary | ICD-10-CM | POA: Diagnosis not present

## 2016-09-13 ENCOUNTER — Ambulatory Visit (INDEPENDENT_AMBULATORY_CARE_PROVIDER_SITE_OTHER): Payer: Medicare Other

## 2016-09-13 DIAGNOSIS — J309 Allergic rhinitis, unspecified: Secondary | ICD-10-CM

## 2016-09-27 ENCOUNTER — Ambulatory Visit (INDEPENDENT_AMBULATORY_CARE_PROVIDER_SITE_OTHER): Payer: Medicare Other

## 2016-09-27 DIAGNOSIS — J309 Allergic rhinitis, unspecified: Secondary | ICD-10-CM

## 2016-10-05 DIAGNOSIS — J301 Allergic rhinitis due to pollen: Secondary | ICD-10-CM | POA: Diagnosis not present

## 2016-10-06 DIAGNOSIS — J3089 Other allergic rhinitis: Secondary | ICD-10-CM | POA: Diagnosis not present

## 2016-10-19 ENCOUNTER — Ambulatory Visit (INDEPENDENT_AMBULATORY_CARE_PROVIDER_SITE_OTHER): Payer: Medicare Other

## 2016-10-19 DIAGNOSIS — J309 Allergic rhinitis, unspecified: Secondary | ICD-10-CM

## 2016-10-24 DIAGNOSIS — K2100 Gastro-esophageal reflux disease with esophagitis, without bleeding: Secondary | ICD-10-CM | POA: Insufficient documentation

## 2016-10-24 DIAGNOSIS — F419 Anxiety disorder, unspecified: Secondary | ICD-10-CM | POA: Insufficient documentation

## 2016-11-08 ENCOUNTER — Ambulatory Visit (INDEPENDENT_AMBULATORY_CARE_PROVIDER_SITE_OTHER): Payer: Medicare Other | Admitting: *Deleted

## 2016-11-08 DIAGNOSIS — J309 Allergic rhinitis, unspecified: Secondary | ICD-10-CM | POA: Diagnosis not present

## 2016-11-29 ENCOUNTER — Ambulatory Visit (INDEPENDENT_AMBULATORY_CARE_PROVIDER_SITE_OTHER): Payer: Medicare Other | Admitting: *Deleted

## 2016-11-29 DIAGNOSIS — J309 Allergic rhinitis, unspecified: Secondary | ICD-10-CM | POA: Diagnosis not present

## 2016-12-26 ENCOUNTER — Ambulatory Visit (INDEPENDENT_AMBULATORY_CARE_PROVIDER_SITE_OTHER): Payer: Medicare Other

## 2016-12-26 DIAGNOSIS — J309 Allergic rhinitis, unspecified: Secondary | ICD-10-CM | POA: Diagnosis not present

## 2016-12-26 NOTE — Addendum Note (Signed)
Addended by: Berna BueWHITAKER, CARRIE L on: 12/26/2016 02:17 PM   Modules accepted: Orders

## 2017-01-02 ENCOUNTER — Ambulatory Visit (INDEPENDENT_AMBULATORY_CARE_PROVIDER_SITE_OTHER): Payer: Medicare Other

## 2017-01-02 DIAGNOSIS — J309 Allergic rhinitis, unspecified: Secondary | ICD-10-CM | POA: Diagnosis not present

## 2017-01-11 ENCOUNTER — Ambulatory Visit (INDEPENDENT_AMBULATORY_CARE_PROVIDER_SITE_OTHER): Payer: Medicare Other

## 2017-01-11 DIAGNOSIS — J309 Allergic rhinitis, unspecified: Secondary | ICD-10-CM

## 2017-01-22 ENCOUNTER — Ambulatory Visit (INDEPENDENT_AMBULATORY_CARE_PROVIDER_SITE_OTHER): Payer: Medicare Other

## 2017-01-22 DIAGNOSIS — J309 Allergic rhinitis, unspecified: Secondary | ICD-10-CM

## 2017-01-31 ENCOUNTER — Ambulatory Visit (INDEPENDENT_AMBULATORY_CARE_PROVIDER_SITE_OTHER): Payer: Medicare Other

## 2017-01-31 DIAGNOSIS — J309 Allergic rhinitis, unspecified: Secondary | ICD-10-CM

## 2017-02-13 ENCOUNTER — Ambulatory Visit (INDEPENDENT_AMBULATORY_CARE_PROVIDER_SITE_OTHER): Payer: Medicare Other

## 2017-02-13 DIAGNOSIS — J309 Allergic rhinitis, unspecified: Secondary | ICD-10-CM | POA: Diagnosis not present

## 2017-03-01 DIAGNOSIS — M7752 Other enthesopathy of left foot: Secondary | ICD-10-CM | POA: Insufficient documentation

## 2017-03-02 ENCOUNTER — Ambulatory Visit (INDEPENDENT_AMBULATORY_CARE_PROVIDER_SITE_OTHER): Payer: Medicare Other

## 2017-03-02 DIAGNOSIS — J309 Allergic rhinitis, unspecified: Secondary | ICD-10-CM

## 2017-03-08 ENCOUNTER — Encounter: Payer: Self-pay | Admitting: *Deleted

## 2017-03-11 DIAGNOSIS — J301 Allergic rhinitis due to pollen: Secondary | ICD-10-CM | POA: Diagnosis not present

## 2017-03-12 DIAGNOSIS — J3089 Other allergic rhinitis: Secondary | ICD-10-CM | POA: Diagnosis not present

## 2017-03-14 ENCOUNTER — Ambulatory Visit (INDEPENDENT_AMBULATORY_CARE_PROVIDER_SITE_OTHER): Payer: Medicare Other

## 2017-03-14 DIAGNOSIS — J309 Allergic rhinitis, unspecified: Secondary | ICD-10-CM

## 2017-04-02 ENCOUNTER — Ambulatory Visit (INDEPENDENT_AMBULATORY_CARE_PROVIDER_SITE_OTHER): Payer: Medicare Other

## 2017-04-02 DIAGNOSIS — J309 Allergic rhinitis, unspecified: Secondary | ICD-10-CM

## 2017-04-12 ENCOUNTER — Ambulatory Visit (INDEPENDENT_AMBULATORY_CARE_PROVIDER_SITE_OTHER): Payer: Medicare Other | Admitting: Allergy and Immunology

## 2017-04-12 ENCOUNTER — Ambulatory Visit: Payer: Self-pay

## 2017-04-12 ENCOUNTER — Encounter: Payer: Self-pay | Admitting: Allergy and Immunology

## 2017-04-12 DIAGNOSIS — J3089 Other allergic rhinitis: Secondary | ICD-10-CM | POA: Diagnosis not present

## 2017-04-12 DIAGNOSIS — J309 Allergic rhinitis, unspecified: Secondary | ICD-10-CM

## 2017-04-12 MED ORDER — EPINEPHRINE 0.3 MG/0.3ML IJ SOAJ: INTRAMUSCULAR | 1 refills | Status: DC

## 2017-04-12 NOTE — Patient Instructions (Signed)
Allergic rhinitis with a possible nonallergic component  Continue appropriate allergen avoidance measures, aeroallergen immunotherapy as prescribed and as tolerated, and azelastine nasal spray as needed. May spread out injection interval to every 3 weeks.  I have also recommended nasal saline spray (i.e. Simply Saline) as needed and prior to medicated nasal sprays.  May use Allegra (fexofenadine) 180 mg daily, if needed.  Patient was counseled that beta blockers may attenuate the effects of epinephrine   Return in about 1 year (around 04/12/2018), or if symptoms worsen or fail to improve.

## 2017-04-12 NOTE — Addendum Note (Signed)
Addended by: Vincent Peyer A on: 04/12/2017 01:45 PM   Modules accepted: Orders

## 2017-04-12 NOTE — Progress Notes (Signed)
Follow-up Note  RE: Jenah C FALINE LANGER: [id number] DOB: July 25, 1946 Date of Office Visit: 04/12/2017  Primary care provider: Joneen Roach, NP Referring provider: Joneen Roach, NP  History of present illness: Krystina Strieter is a 71 y.o. female with allergic rhinitis on immunotherapy and history of atrial fibrillation status post ablation presenting today for follow up.  She was last seen in this clinic in February 2017 by Dr. Clydie Braun, who has since left the practice.  She is receiving aeroallergen immunotherapy injections every 2 weeks.  She occasionally has local reactions no larger than a quarter to half dollar.  She reports the treatment plan has resulted in symptom reduction, however she still experiences occasional rhinorrhea, particularly with cold weather and rapid temperature changes such as when she is in a grocery store.   Assessment and plan: Allergic rhinitis with a possible nonallergic component  Continue appropriate allergen avoidance measures, aeroallergen immunotherapy as prescribed and as tolerated, and azelastine nasal spray as needed. May spread out injection interval to every 3 weeks.  I have also recommended nasal saline spray (i.e. Simply Saline) as needed and prior to medicated nasal sprays.  May use Allegra (fexofenadine) 180 mg daily, if needed.  Patient was counseled that beta blockers may attenuate the effects of epinephrine   Physical examination: Blood pressure 118/72, pulse 78, temperature 98.2 F (36.8 C), temperature source Oral, resp. rate 16, SpO2 97 %.  General: Alert, interactive, in no acute distress. HEENT: TMs pearly gray, turbinates minimally edematous without discharge, post-pharynx mildly erythematous. Neck: Supple without lymphadenopathy. Lungs: Clear to auscultation without wheezing, rhonchi or rales. CV: Normal S1, S2 without murmurs. Skin: Warm and dry, without lesions or rashes.  The following portions of the patient's  history were reviewed and updated as appropriate: allergies, current medications, past family history, past medical history, past social history, past surgical history and problem list.  Allergies as of 04/12/2017      Reactions   Sulfa Antibiotics Rash   rash   Latex Rash   Other reaction(s): RASH Other reaction(s): RASH      Medication List       Accurate as of 04/12/17 12:36 PM. Always use your most recent med list.          atorvastatin 20 MG tablet Commonly known as:  LIPITOR Take by mouth.   azelastine 0.1 % nasal spray Commonly known as:  ASTELIN Place into the nose.   B-D INS SYR ULTRAFINE .5CC/30G 30G X 1/2" 0.5 ML Misc Generic drug:  Insulin Syringe-Needle U-100 Use 1 syringe every 14 days for B-12 injection   cyanocobalamin 1000 MCG/ML injection Commonly known as:  (VITAMIN B-12) Inject into the skin.   EPINEPHrine 0.3 mg/0.3 mL Soaj injection Commonly known as:  EPI-PEN Inject into the muscle.   escitalopram 20 MG tablet Commonly known as:  LEXAPRO Take by mouth.   fexofenadine 180 MG tablet Commonly known as:  ALLEGRA Take by mouth.   flecainide 50 MG tablet Commonly known as:  TAMBOCOR Take 50 mg by mouth.   levothyroxine 50 MCG tablet Commonly known as:  SYNTHROID, LEVOTHROID Take 50 mcg by mouth daily before breakfast.   meloxicam 7.5 MG tablet Commonly known as:  MOBIC Take by mouth.   NON FORMULARY   omega-3 acid ethyl esters 1 g capsule Commonly known as:  LOVAZA Take by mouth.   PRILOSEC 20 MG capsule Generic drug:  omeprazole Take 20 mg by mouth daily.   rivaroxaban 10 MG  Tabs tablet Commonly known as:  XARELTO Take 10 mg by mouth.   Vitamin D3 2000 units capsule Take by mouth.   zolpidem 10 MG tablet Commonly known as:  AMBIEN Take by mouth.       Allergies  Allergen Reactions  . Sulfa Antibiotics Rash    rash  . Latex Rash    Other reaction(s): RASH Other reaction(s): RASH    I appreciate the opportunity  to take part in Masiah's care. Please do not hesitate to contact me with questions.  Sincerely,   R. Jorene Guest, MD

## 2017-04-12 NOTE — Assessment & Plan Note (Signed)
   Continue appropriate allergen avoidance measures, aeroallergen immunotherapy as prescribed and as tolerated, and azelastine nasal spray as needed. May spread out injection interval to every 3 weeks.  I have also recommended nasal saline spray (i.e. Simply Saline) as needed and prior to medicated nasal sprays.  May use Allegra (fexofenadine) 180 mg daily, if needed.  Patient was counseled that beta blockers may attenuate the effects of epinephrine

## 2017-05-03 ENCOUNTER — Ambulatory Visit (INDEPENDENT_AMBULATORY_CARE_PROVIDER_SITE_OTHER): Payer: Medicare Other | Admitting: *Deleted

## 2017-05-03 DIAGNOSIS — J309 Allergic rhinitis, unspecified: Secondary | ICD-10-CM

## 2017-05-08 DIAGNOSIS — F341 Dysthymic disorder: Secondary | ICD-10-CM | POA: Insufficient documentation

## 2017-05-23 ENCOUNTER — Ambulatory Visit (INDEPENDENT_AMBULATORY_CARE_PROVIDER_SITE_OTHER): Payer: Medicare Other

## 2017-05-23 DIAGNOSIS — J309 Allergic rhinitis, unspecified: Secondary | ICD-10-CM | POA: Diagnosis not present

## 2017-05-29 ENCOUNTER — Ambulatory Visit (INDEPENDENT_AMBULATORY_CARE_PROVIDER_SITE_OTHER): Payer: Medicare Other

## 2017-05-29 DIAGNOSIS — J309 Allergic rhinitis, unspecified: Secondary | ICD-10-CM | POA: Diagnosis not present

## 2017-06-06 ENCOUNTER — Ambulatory Visit (INDEPENDENT_AMBULATORY_CARE_PROVIDER_SITE_OTHER): Payer: Medicare Other

## 2017-06-06 DIAGNOSIS — J309 Allergic rhinitis, unspecified: Secondary | ICD-10-CM

## 2017-06-14 ENCOUNTER — Ambulatory Visit (INDEPENDENT_AMBULATORY_CARE_PROVIDER_SITE_OTHER): Payer: Medicare Other | Admitting: *Deleted

## 2017-06-14 DIAGNOSIS — J309 Allergic rhinitis, unspecified: Secondary | ICD-10-CM

## 2017-06-25 ENCOUNTER — Ambulatory Visit (INDEPENDENT_AMBULATORY_CARE_PROVIDER_SITE_OTHER): Payer: Medicare Other

## 2017-06-25 DIAGNOSIS — J309 Allergic rhinitis, unspecified: Secondary | ICD-10-CM | POA: Diagnosis not present

## 2017-07-04 ENCOUNTER — Ambulatory Visit (INDEPENDENT_AMBULATORY_CARE_PROVIDER_SITE_OTHER): Payer: Medicare Other | Admitting: *Deleted

## 2017-07-04 DIAGNOSIS — J309 Allergic rhinitis, unspecified: Secondary | ICD-10-CM

## 2017-07-26 ENCOUNTER — Ambulatory Visit (INDEPENDENT_AMBULATORY_CARE_PROVIDER_SITE_OTHER): Payer: Medicare Other

## 2017-07-26 DIAGNOSIS — J309 Allergic rhinitis, unspecified: Secondary | ICD-10-CM | POA: Diagnosis not present

## 2017-08-15 ENCOUNTER — Ambulatory Visit (INDEPENDENT_AMBULATORY_CARE_PROVIDER_SITE_OTHER): Payer: Medicare Other | Admitting: *Deleted

## 2017-08-15 DIAGNOSIS — J309 Allergic rhinitis, unspecified: Secondary | ICD-10-CM

## 2017-08-30 ENCOUNTER — Ambulatory Visit (INDEPENDENT_AMBULATORY_CARE_PROVIDER_SITE_OTHER): Payer: Medicare Other | Admitting: *Deleted

## 2017-08-30 DIAGNOSIS — J309 Allergic rhinitis, unspecified: Secondary | ICD-10-CM

## 2017-09-10 DIAGNOSIS — J301 Allergic rhinitis due to pollen: Secondary | ICD-10-CM

## 2017-09-11 DIAGNOSIS — J301 Allergic rhinitis due to pollen: Secondary | ICD-10-CM

## 2017-09-17 ENCOUNTER — Other Ambulatory Visit: Payer: Self-pay

## 2017-09-17 MED ORDER — AZELASTINE HCL 0.1 % NA SOLN: 2.0000 | Freq: Two times a day (BID) | NASAL | 5 refills | Status: DC

## 2017-09-20 ENCOUNTER — Ambulatory Visit (INDEPENDENT_AMBULATORY_CARE_PROVIDER_SITE_OTHER): Payer: Medicare Other | Admitting: *Deleted

## 2017-09-20 ENCOUNTER — Other Ambulatory Visit: Payer: Self-pay

## 2017-09-20 DIAGNOSIS — J309 Allergic rhinitis, unspecified: Secondary | ICD-10-CM | POA: Diagnosis not present

## 2017-09-20 MED ORDER — AZELASTINE HCL 0.1 % NA SOLN: 2.0000 | Freq: Two times a day (BID) | NASAL | 5 refills | Status: DC

## 2017-09-20 NOTE — Telephone Encounter (Signed)
Refilled azelastine 0.1% x's 5

## 2017-10-11 ENCOUNTER — Ambulatory Visit (INDEPENDENT_AMBULATORY_CARE_PROVIDER_SITE_OTHER): Payer: Medicare Other

## 2017-10-11 DIAGNOSIS — J309 Allergic rhinitis, unspecified: Secondary | ICD-10-CM | POA: Diagnosis not present

## 2017-11-02 ENCOUNTER — Ambulatory Visit (INDEPENDENT_AMBULATORY_CARE_PROVIDER_SITE_OTHER): Payer: Medicare Other | Admitting: *Deleted

## 2017-11-02 DIAGNOSIS — J309 Allergic rhinitis, unspecified: Secondary | ICD-10-CM

## 2017-11-15 ENCOUNTER — Ambulatory Visit (INDEPENDENT_AMBULATORY_CARE_PROVIDER_SITE_OTHER): Payer: Medicare Other

## 2017-11-15 DIAGNOSIS — J309 Allergic rhinitis, unspecified: Secondary | ICD-10-CM | POA: Diagnosis not present

## 2017-12-03 ENCOUNTER — Ambulatory Visit (INDEPENDENT_AMBULATORY_CARE_PROVIDER_SITE_OTHER): Payer: Medicare Other

## 2017-12-03 DIAGNOSIS — J309 Allergic rhinitis, unspecified: Secondary | ICD-10-CM

## 2017-12-20 ENCOUNTER — Ambulatory Visit (INDEPENDENT_AMBULATORY_CARE_PROVIDER_SITE_OTHER): Payer: Medicare Other

## 2017-12-20 DIAGNOSIS — J309 Allergic rhinitis, unspecified: Secondary | ICD-10-CM | POA: Diagnosis not present

## 2018-01-03 ENCOUNTER — Ambulatory Visit (INDEPENDENT_AMBULATORY_CARE_PROVIDER_SITE_OTHER): Payer: Medicare Other | Admitting: *Deleted

## 2018-01-03 DIAGNOSIS — J309 Allergic rhinitis, unspecified: Secondary | ICD-10-CM | POA: Diagnosis not present

## 2018-01-30 ENCOUNTER — Ambulatory Visit (INDEPENDENT_AMBULATORY_CARE_PROVIDER_SITE_OTHER): Payer: Medicare Other | Admitting: Allergy and Immunology

## 2018-01-30 ENCOUNTER — Encounter: Payer: Self-pay | Admitting: Allergy and Immunology

## 2018-01-30 VITALS — BP 120/60 | HR 75 | Temp 98.3°F | Resp 20 | Ht 70.0 in | Wt 160.0 lb

## 2018-01-30 DIAGNOSIS — J3089 Other allergic rhinitis: Secondary | ICD-10-CM | POA: Diagnosis not present

## 2018-01-30 DIAGNOSIS — R053 Chronic cough: Secondary | ICD-10-CM | POA: Insufficient documentation

## 2018-01-30 DIAGNOSIS — R05 Cough: Secondary | ICD-10-CM | POA: Diagnosis not present

## 2018-01-30 DIAGNOSIS — J0100 Acute maxillary sinusitis, unspecified: Secondary | ICD-10-CM

## 2018-01-30 DIAGNOSIS — J019 Acute sinusitis, unspecified: Secondary | ICD-10-CM | POA: Insufficient documentation

## 2018-01-30 MED ORDER — AZELASTINE HCL 0.1 % NA SOLN: 2.0000 | Freq: Two times a day (BID) | NASAL | 5 refills | Status: DC

## 2018-01-30 MED ORDER — PREDNISONE 1 MG PO TABS: 10.0000 mg | ORAL_TABLET | Freq: Every day | ORAL | Status: AC

## 2018-01-30 NOTE — Assessment & Plan Note (Signed)
The patient's history and physical examination suggest upper airway cough syndrome.  Spirometry today reveals normal ventilatory function. We will aggressively treat postnasal drainage and evaluate results.  Treatment plan as outlined above.  If the coughing persists or progresses despite this plan, we will evaluate further. 

## 2018-01-30 NOTE — Progress Notes (Signed)
Follow-up Note  RE: Rebecca Carter MRN: [id number] DOB: 1946-04-30 Date of Office Visit: 01/30/2018  Primary care provider: Joneen Roach, NP Referring provider: Joneen Roach, NP  History of present illness: Rebecca Carter is a 72 y.o. female with allergic rhinitis on immunotherapy and history of atrial fibrillation status post ablation presenting today for a sick visit.  She was last seen in this clinic in April 2018.  She reports that over the past 1 or 2 weeks she has been experiencing sinus pressure over the forehead and cheekbones or rhinorrhea, and postnasal drainage.  She has also been experiencing persistent cough.  Most likely due to upper airway cough syndrome versus silent reflux, or multifactorial. Another possibility includes occult chronic sinusitis. We will aggressively treat postnasal drainage over the next month and reevaluate. Depending on results, we will consider a therapeutic trial with proton pump inhibitor.  The cough seems to originate as a tickle in the base of her throat.  She denies fevers, chills, or discolored mucus production.  She believes that she has been on immunotherapy injections since 2004.  She is currently receiving injections once a month without complications.   Assessment and plan: Acute sinusitis  Prednisone has been provided, 20 mg x 4 days, 10 mg x1 day, then stop.  For now, I recommended using azelastine 2 sprays per nostril twice daily until symptoms have returned to baseline.  Nasal saline lavage (NeilMed) has been recommended as needed and prior to medicated nasal sprays along with instructions for proper administration.  For thick post nasal drainage, add guaifenesin 600 mg (Mucinex)  twice daily as needed with adequate hydration as discussed.  The patient has been asked to contact me if her symptoms persist, progress, or if she becomes febrile. Otherwise, she may return for follow up in 6 months.  Cough The patient's history  and physical examination suggest upper airway cough syndrome.  Spirometry today reveals normal ventilatory function. We will aggressively treat postnasal drainage and evaluate results.  Treatment plan as outlined above.  If the coughing persists or progresses despite this plan, we will evaluate further.  Allergic rhinitis with a possible nonallergic component  Continue appropriate allergen avoidance measures, azelastine nasal spray, and nasal saline irrigation.  Immunotherapy maintenance for longer than 5 years, I have recommended holding immunotherapy injections after her injection next week.  If her nasal allergy symptoms progress during the spring/summer pollen season, we will restart immunotherapy, otherwise will be discontinued.   Meds ordered this encounter  Medications  . predniSONE (DELTASONE) tablet 10 mg  . azelastine (ASTELIN) 0.1 % nasal spray    Sig: Place 2 sprays into both nostrils 2 (two) times daily.    Dispense:  30 mL    Refill:  5    Diagnostics: Spirometry:  Normal with an FEV1 of 92% predicted.  Please see scanned spirometry results for details.    Physical examination: Blood pressure 120/60, pulse 75, temperature 98.3 F (36.8 C), temperature source Oral, resp. rate 20, height 5\' 10"  (1.778 m), weight 160 lb (72.6 kg), SpO2 96 %.  General: Alert, interactive, in no acute distress. HEENT: TMs pearly gray, turbinates edematous with thick discharge, post-pharynx erythematous. Neck: Supple without lymphadenopathy. Lungs: Clear to auscultation without wheezing, rhonchi or rales. CV: Normal S1, S2 without murmurs. Skin: Warm and dry, without lesions or rashes.  The following portions of the patient's history were reviewed and updated as appropriate: allergies, current medications, past family history, past medical history,  past social history, past surgical history and problem list.  Allergies as of 01/30/2018      Reactions   Sulfa Antibiotics Rash   rash    Trazodone    nightmares   Latex Rash   Other reaction(s): RASH      Medication List        Accurate as of 01/30/18  4:44 PM. Always use your most recent med list.          atorvastatin 20 MG tablet Commonly known as:  LIPITOR Take 10 mg by mouth daily.   azelastine 0.1 % nasal spray Commonly known as:  ASTELIN Place 2 sprays into both nostrils 2 (two) times daily.   B-D INS SYR ULTRAFINE .5CC/30G 30G X 1/2" 0.5 ML Misc Generic drug:  Insulin Syringe-Needle U-100 Use 1 syringe every 14 days for B-12 injection   cyanocobalamin 1000 MCG/ML injection Commonly known as:  (VITAMIN B-12) Inject into the skin.   EPINEPHrine 0.3 mg/0.3 mL Soaj injection Commonly known as:  EPI-PEN Use as directed for severe allergic reaction.   escitalopram 20 MG tablet Commonly known as:  LEXAPRO Take 10 mg by mouth.   fexofenadine 180 MG tablet Commonly known as:  ALLEGRA Take by mouth as needed.   flecainide 50 MG tablet Commonly known as:  TAMBOCOR Take 50 mg by mouth.   levothyroxine 50 MCG tablet Commonly known as:  SYNTHROID, LEVOTHROID Take 50 mcg by mouth daily before breakfast.   meloxicam 7.5 MG tablet Commonly known as:  MOBIC Take by mouth.   NON FORMULARY   omega-3 acid ethyl esters 1 g capsule Commonly known as:  LOVAZA Take by mouth.   PRILOSEC 20 MG capsule Generic drug:  omeprazole Take 20 mg by mouth daily.   rivaroxaban 10 MG Tabs tablet Commonly known as:  XARELTO Take 10 mg by mouth.   Vitamin D3 2000 units capsule Take by mouth.   zolpidem 10 MG tablet Commonly known as:  AMBIEN Take 5 mg by mouth daily.       Allergies  Allergen Reactions  . Sulfa Antibiotics Rash    rash  . Trazodone     nightmares  . Latex Rash    Other reaction(s): RASH    Review of systems: Review of systems negative except as noted in HPI / PMHx or noted below: Constitutional: Negative.  HENT: Negative.   Eyes: Negative.  Respiratory: Negative.     Cardiovascular: Negative.  Gastrointestinal: Negative.  Genitourinary: Negative.  Musculoskeletal: Negative.  Neurological: Negative.  Endo/Heme/Allergies: Negative.  Cutaneous: Negative.  Past Medical History:  Diagnosis Date  . Allergic rhinitis     Family History  Problem Relation Age of Onset  . Asthma Mother   . Asthma Sister     Social History   Socioeconomic History  . Marital status: Married    Spouse name: Not on file  . Number of children: Not on file  . Years of education: Not on file  . Highest education level: Not on file  Social Needs  . Financial resource strain: Not on file  . Food insecurity - worry: Not on file  . Food insecurity - inability: Not on file  . Transportation needs - medical: Not on file  . Transportation needs - non-medical: Not on file  Occupational History  . Not on file  Tobacco Use  . Smoking status: Never Smoker  . Smokeless tobacco: Never Used  Substance and Sexual Activity  . Alcohol use: Yes  Alcohol/week: 0.0 oz  . Drug use: No  . Sexual activity: Not on file  Other Topics Concern  . Not on file  Social History Narrative  . Not on file    I appreciate the opportunity to take part in Addisson's care. Please do not hesitate to contact me with questions.  Sincerely,   R. Jorene Guestarter Amalya Salmons, MD

## 2018-01-30 NOTE — Assessment & Plan Note (Signed)
   Continue appropriate allergen avoidance measures, azelastine nasal spray, and nasal saline irrigation.  Immunotherapy maintenance for longer than 5 years, I have recommended holding immunotherapy injections after her injection next week.  If her nasal allergy symptoms progress during the spring/summer pollen season, we will restart immunotherapy, otherwise will be discontinued.

## 2018-01-30 NOTE — Assessment & Plan Note (Signed)
   Prednisone has been provided, 20 mg x 4 days, 10 mg x1 day, then stop.  For now, I recommended using azelastine 2 sprays per nostril twice daily until symptoms have returned to baseline.  Nasal saline lavage (NeilMed) has been recommended as needed and prior to medicated nasal sprays along with instructions for proper administration.  For thick post nasal drainage, add guaifenesin 600 mg (Mucinex)  twice daily as needed with adequate hydration as discussed.  The patient has been asked to contact me if her symptoms persist, progress, or if she becomes febrile. Otherwise, she may return for follow up in 6 months.

## 2018-01-30 NOTE — Assessment & Plan Note (Deleted)
   Continue appropriate allergen avoidance measures, azelastine nasal spray, and nasal saline irrigation.  I have discussed holding immunotherapy injections after her injection next week.  If her nasal allergy symptoms progress during the spring/summer pollen season, we will restart immunotherapy, otherwise will be discontinued.

## 2018-01-30 NOTE — Patient Instructions (Addendum)
Acute sinusitis  Prednisone has been provided, 20 mg x 4 days, 10 mg x1 day, then stop.  For now, I recommended using azelastine 2 sprays per nostril twice daily until symptoms have returned to baseline.  Nasal saline lavage (NeilMed) has been recommended as needed and prior to medicated nasal sprays along with instructions for proper administration.  For thick post nasal drainage, add guaifenesin 600 mg (Mucinex)  twice daily as needed with adequate hydration as discussed.  The patient has been asked to contact me if her symptoms persist, progress, or if she becomes febrile. Otherwise, she may return for follow up in 6 months.  Cough The patient's history and physical examination suggest upper airway cough syndrome.  Spirometry today reveals normal ventilatory function. We will aggressively treat postnasal drainage and evaluate results.  Treatment plan as outlined above.  If the coughing persists or progresses despite this plan, we will evaluate further.  Allergic rhinitis with a possible nonallergic component  Continue appropriate allergen avoidance measures, azelastine nasal spray, and nasal saline irrigation.  Immunotherapy maintenance for longer than 5 years, I have recommended holding immunotherapy injections after her injection next week.  If her nasal allergy symptoms progress during the spring/summer pollen season, we will restart immunotherapy, otherwise will be discontinued.   Return in about 6 months (around 07/30/2018), or if symptoms worsen or fail to improve.

## 2018-02-07 ENCOUNTER — Telehealth: Payer: Self-pay | Admitting: *Deleted

## 2018-02-07 MED ORDER — DOXYCYCLINE HYCLATE 100 MG PO CAPS: ORAL_CAPSULE | ORAL | 0 refills | Status: DC

## 2018-02-07 NOTE — Telephone Encounter (Signed)
Because she is allergic to sulfonamide antibiotics and Augmentin, we will need to prescribe doxycycline 100 mg twice daily times 7 days.  She should continue all other recommended interventions.  Thanks.

## 2018-02-07 NOTE — Telephone Encounter (Signed)
Pt saw Dr Nunzio CobbsBobbitt 02/13 for sinusitis and she has finished her prednisone and is taking everything she was instructed to- including azelastine, sinus rinses, and mucinex. She is still having sinus pressure and yellow mucus and is asking for antibiotic? Please advise.   Pt unable to take Augmentin- causes stomach issues.

## 2018-02-07 NOTE — Telephone Encounter (Signed)
Sent in Doxycycline 100 mg BID x 7 days.  Patient informed.

## 2018-03-01 ENCOUNTER — Ambulatory Visit (INDEPENDENT_AMBULATORY_CARE_PROVIDER_SITE_OTHER): Payer: Medicare Other

## 2018-03-01 DIAGNOSIS — J309 Allergic rhinitis, unspecified: Secondary | ICD-10-CM | POA: Diagnosis not present

## 2018-03-07 ENCOUNTER — Ambulatory Visit (INDEPENDENT_AMBULATORY_CARE_PROVIDER_SITE_OTHER): Payer: Medicare Other

## 2018-03-07 DIAGNOSIS — J309 Allergic rhinitis, unspecified: Secondary | ICD-10-CM | POA: Diagnosis not present

## 2018-03-15 ENCOUNTER — Ambulatory Visit (INDEPENDENT_AMBULATORY_CARE_PROVIDER_SITE_OTHER): Payer: Medicare Other

## 2018-03-15 DIAGNOSIS — J309 Allergic rhinitis, unspecified: Secondary | ICD-10-CM

## 2018-04-01 ENCOUNTER — Ambulatory Visit (INDEPENDENT_AMBULATORY_CARE_PROVIDER_SITE_OTHER): Payer: Medicare Other | Admitting: *Deleted

## 2018-04-01 DIAGNOSIS — J309 Allergic rhinitis, unspecified: Secondary | ICD-10-CM | POA: Diagnosis not present

## 2018-04-01 NOTE — Progress Notes (Signed)
Vials exp 04-04-19 

## 2018-04-03 DIAGNOSIS — J301 Allergic rhinitis due to pollen: Secondary | ICD-10-CM

## 2018-04-04 DIAGNOSIS — J3089 Other allergic rhinitis: Secondary | ICD-10-CM

## 2018-04-23 ENCOUNTER — Ambulatory Visit (INDEPENDENT_AMBULATORY_CARE_PROVIDER_SITE_OTHER): Payer: Medicare Other

## 2018-04-23 DIAGNOSIS — J309 Allergic rhinitis, unspecified: Secondary | ICD-10-CM

## 2018-05-08 DIAGNOSIS — Z4789 Encounter for other orthopedic aftercare: Secondary | ICD-10-CM | POA: Insufficient documentation

## 2018-05-23 ENCOUNTER — Ambulatory Visit (INDEPENDENT_AMBULATORY_CARE_PROVIDER_SITE_OTHER): Payer: Medicare Other

## 2018-05-23 DIAGNOSIS — J309 Allergic rhinitis, unspecified: Secondary | ICD-10-CM | POA: Diagnosis not present

## 2018-06-17 ENCOUNTER — Ambulatory Visit (INDEPENDENT_AMBULATORY_CARE_PROVIDER_SITE_OTHER): Payer: Medicare Other

## 2018-06-17 DIAGNOSIS — J309 Allergic rhinitis, unspecified: Secondary | ICD-10-CM | POA: Diagnosis not present

## 2018-07-12 DIAGNOSIS — M65972 Unspecified synovitis and tenosynovitis, left ankle and foot: Secondary | ICD-10-CM | POA: Insufficient documentation

## 2018-07-12 DIAGNOSIS — M659 Synovitis and tenosynovitis, unspecified: Secondary | ICD-10-CM | POA: Insufficient documentation

## 2018-07-31 ENCOUNTER — Encounter: Payer: Self-pay | Admitting: Allergy & Immunology

## 2018-07-31 ENCOUNTER — Ambulatory Visit (INDEPENDENT_AMBULATORY_CARE_PROVIDER_SITE_OTHER): Payer: Medicare Other | Admitting: Allergy & Immunology

## 2018-07-31 VITALS — BP 128/78 | HR 72 | Temp 98.1°F | Resp 20

## 2018-07-31 DIAGNOSIS — J309 Allergic rhinitis, unspecified: Secondary | ICD-10-CM

## 2018-07-31 DIAGNOSIS — J302 Other seasonal allergic rhinitis: Secondary | ICD-10-CM

## 2018-07-31 DIAGNOSIS — J3089 Other allergic rhinitis: Secondary | ICD-10-CM | POA: Diagnosis not present

## 2018-07-31 NOTE — Progress Notes (Signed)
FOLLOW UP  Date of Service/Encounter:  07/31/18  Assessment:   Seasonal and perennial allergic rhinitis (grasses, weeds, trees, cat, dog, dust mite) - on allergen immunotherapy with maintenance reached in 2013   Ms. Rebecca Carter presents for a follow-up visit.  She has been on allergen immunotherapy for well over 5 years for this current stint, and she can certainly stop.  There is no data suggestive of improvement in symptoms after 5 years of treatment.  However, she is very hesitant to stop her injections, as the last time that she did this her health worsen.  As a means of figuring out whether her allergies are cured objectively, we will get some allergy testing.  We will start with blood work, and consider skin testing if this is unrevealing. Patient in agreement with the plan.   Plan/Recommendations:   1. Non-seasonal allergic rhinitis - We will get blood to check on your sensitizations at this point. - We may consider skin testing if the blood work is unremarkable.  - In the interim, start nasal ipratropium one spray per nostril every 4-6 hours as needed for the runny nose (this can be over drying so be careful).  2. Return in about 6 months (around 01/31/2019).   Subjective:   Rebecca Carter is a 72 y.o. female presenting today for follow up of  Chief Complaint  Patient presents with  . Allergic Rhinitis     Rebecca Carter has a history of the following: Patient Active Problem List   Diagnosis Date Noted  . Acute sinusitis 01/30/2018  . Cough 01/30/2018  . Allergic rhinitis with a possible nonallergic component 02/04/2016    History obtained from: chart review and patient.  Rebecca Carter's Primary Care Provider is Joneen Roachhomas, Gillian W, NP.     Rebecca Carter is a 72 y.o. female presenting for a follow up visit.  She was last seen in February 2019.  At that time, she was started on prednisone for a sinus infection as well as Mucinex.  She was continued on allergen immunotherapy  for her allergic rhinitis.  She was complaining about a cough, but aspirable was normal.  Since the last visit, she has mostly done well. She has been off and on allergy shots over the years and she would like to get off the shots. Her last injection was in July 2019.  According to her office visit note in February 2017 with Dr. Clydie BraunBhatti, she started this current round of allergen immunotherapy in March 2012 and reached maintenance in 2013.  She did have some problems with large local reactions during the buildup.  In any case, at this point, she would like to stop the shots but is concerned that her symptoms will abruptly return.  She does go back and forth during the visit about continuing with the shots versus stopping them.  She has been having a constant clear drip over the last few months. This is causing a scab to form in her nose and she has been using triple antibiotic ointment without improvement. She is worried that it is spinal fluid. But she has had no trauma.  Her only surgery was in May 2019 for heel surgery.  Her clear postnasal drip does improve with the use of as a lasting.  She is as a lasting fairly consistently, which did improve the clear discharge.  However, she lost her sense of taste and stopped the as a lasting with return of the sense of taste.  Rebecca Carter is  on allergen immunotherapy. She receives two injections. Immunotherapy script #1 contains weeds and dust mites. She currently receives 0.6850mL of the RED vial (1/100). Immunotherapy script #2 contains trees, grasses, cat and dog. She currently receives 0.450mL of the RED vial (1/100). She started shots March of 2012 and reached maintenance in 2013.  She is flecanide and Xarelto for her atrial fibrillation. She did have heel surgery in May 2019.  Otherwise, there have been no changes to her past medical history, surgical history, family history, or social history.  She is a retired Research scientist (medical)lab technician.  She spends her time now traveling and  taking care of her grandchildren.    Review of Systems: a 14-point review of systems is pertinent for what is mentioned in HPI.  Otherwise, all other systems were negative. Constitutional: negative other than that listed in the HPI Eyes: negative other than that listed in the HPI Ears, nose, mouth, throat, and face: negative other than that listed in the HPI Respiratory: negative other than that listed in the HPI Cardiovascular: negative other than that listed in the HPI Gastrointestinal: negative other than that listed in the HPI Genitourinary: negative other than that listed in the HPI Integument: negative other than that listed in the HPI Hematologic: negative other than that listed in the HPI Musculoskeletal: negative other than that listed in the HPI Neurological: negative other than that listed in the HPI Allergy/Immunologic: negative other than that listed in the HPI    Objective:   Blood pressure 128/78, pulse 72, temperature 98.1 F (36.7 C), temperature source Oral, resp. rate 20, SpO2 97 %. There is no height or weight on file to calculate BMI.   Physical Exam:  General: Alert, interactive, in no acute distress.  Very pleasant female. Eyes: No conjunctival injection bilaterally, no discharge on the right, no discharge on the left and no Horner-Trantas dots present. PERRL bilaterally. EOMI without pain. No photophobia.  Ears: Right TM pearly gray with normal light reflex, Left TM pearly gray with normal light reflex, Right TM intact without perforation and Left TM intact without perforation.  Nose/Throat: External nose within normal limits and septum midline. Turbinates edematous without discharge. Posterior oropharynx mildly erythematous without cobblestoning in the posterior oropharynx. Tonsils 2+ without exudates.  Tongue without thrush. Lungs: Clear to auscultation without wheezing, rhonchi or rales. No increased work of breathing. CV: Normal S1/S2. No murmurs.  Capillary refill <2 seconds.  Skin: Warm and dry, without lesions or rashes. Neuro:   Grossly intact. No focal deficits appreciated. Responsive to questions.  Diagnostic studies: none      Malachi BondsJoel Antonious Omahoney, MD  [facility]

## 2018-07-31 NOTE — Patient Instructions (Addendum)
1. Non-seasonal allergic rhinitis - We will get blood to check on your sensitizations at this point. - We may consider skin testing if the blood work is unremarkable.  - In the interim, start nasal ipratropium one spray per nostril every 4-6 hours as needed for the runny nose (this can be over drying so be careful).  2. Return in about 6 months (around 01/31/2019).   Please inform us of any Emergency Department visits, hospitalizations, or changes in symptoms. Call us before going to the ED for breathing or allergy symptoms since we might be able to fit you in for a sick visit. Feel free to contact us anytime with any questions, problems, or concerns.  It was a pleasure to meet you today!  Websites that have reliable patient information: 1. American Academy of Asthma, Allergy, and Immunology: www.aaaai.org 2. Food Allergy Research and Education (FARE): foodallergy.org 3. Mothers of Asthmatics: http://www.asthmacommunitynetwork.org 4. American College of Allergy, Asthma, and Immunology: MissingWeapons.cawww.acaai.org   Make sure you are registered to vote! If you have moved or changed any of your contact information, you will need to get this updated before voting!

## 2018-08-04 LAB — IGE+ALLERGENS ZONE 2(30)
Alternaria Alternata IgE: <0.1 kU/L
Bermuda Grass IgE: <0.1 kU/L
Cedar, Mountain IgE: 1.59 kU/L — AB
Common Silver Birch IgE: 0.19 kU/L — AB
D Pteronyssinus IgE: 0.77 kU/L — AB
D002-IGE D FARINAE: 0.67 kU/L — AB
Dog Dander IgE: <0.1 kU/L
E001-IGE CAT DANDER: 1.72 kU/L — AB
Hickory, White IgE: 0.22 kU/L — AB
IgE (Immunoglobulin E), Serum: 40 IU/mL (ref 6–495)
Mucor Racemosus IgE: <0.1 kU/L
Mugwort IgE Qn: <0.1 kU/L
Nettle IgE: <0.1 kU/L
Oak, White IgE: 0.27 kU/L — AB
Plantain, English IgE: <0.1 kU/L
Ragweed, Short IgE: 0.18 kU/L — AB
Stemphylium Herbarum IgE: <0.1 kU/L
Sweet gum IgE RAST Ql: <0.1 kU/L
Timothy Grass IgE: <0.1 kU/L

## 2018-08-06 ENCOUNTER — Telehealth: Payer: Self-pay | Admitting: Allergy

## 2018-08-06 NOTE — Progress Notes (Signed)
Patient called back and I informed her of labs  Pt Said she would continue shots

## 2018-08-06 NOTE — Telephone Encounter (Signed)
Dr Dellis AnesGallagher When I talked to Erskine SquibbJane about her labs she understood but she was wanting to know if you could call her in a  Antibiotic cream for her nose. She said it was real dry and flaky. Said you and her talked about it on the last visit. Thanks.

## 2018-08-07 ENCOUNTER — Ambulatory Visit (INDEPENDENT_AMBULATORY_CARE_PROVIDER_SITE_OTHER): Payer: Medicare Other

## 2018-08-07 DIAGNOSIS — J309 Allergic rhinitis, unspecified: Secondary | ICD-10-CM

## 2018-08-07 MED ORDER — MUPIROCIN 2 % EX OINT: 1.0000 "application " | TOPICAL_OINTMENT | Freq: Two times a day (BID) | CUTANEOUS | 0 refills | Status: DC

## 2018-08-07 NOTE — Telephone Encounter (Signed)
Pt informed about medication called in for her nose. The wrong pharmacy was in epic but I changed it.

## 2018-08-07 NOTE — Telephone Encounter (Signed)
Sent in mupirocin ointment twice daily into the nose for 7-14 days. Please call patient to let her know. My apologies that this was not done sooner.   Malachi BondsJoel Gallagher, MD [facility]

## 2018-08-14 ENCOUNTER — Emergency Department (HOSPITAL_BASED_OUTPATIENT_CLINIC_OR_DEPARTMENT_OTHER)
Admission: EM | Admit: 2018-08-14 | Discharge: 2018-08-14 | Disposition: A | Discharge status: Home / Self Care | Payer: Medicare Other | Attending: Emergency Medicine | Admitting: Emergency Medicine

## 2018-08-14 ENCOUNTER — Other Ambulatory Visit: Payer: Self-pay

## 2018-08-14 ENCOUNTER — Emergency Department (HOSPITAL_BASED_OUTPATIENT_CLINIC_OR_DEPARTMENT_OTHER): Payer: Medicare Other

## 2018-08-14 ENCOUNTER — Encounter (HOSPITAL_BASED_OUTPATIENT_CLINIC_OR_DEPARTMENT_OTHER): Payer: Self-pay

## 2018-08-14 DIAGNOSIS — Z7901 Long term (current) use of anticoagulants: Secondary | ICD-10-CM | POA: Diagnosis not present

## 2018-08-14 DIAGNOSIS — I4891 Unspecified atrial fibrillation: Secondary | ICD-10-CM

## 2018-08-14 DIAGNOSIS — R002 Palpitations: Secondary | ICD-10-CM | POA: Diagnosis present

## 2018-08-14 DIAGNOSIS — Z8679 Personal history of other diseases of the circulatory system: Secondary | ICD-10-CM | POA: Diagnosis not present

## 2018-08-14 DIAGNOSIS — Z79899 Other long term (current) drug therapy: Secondary | ICD-10-CM | POA: Diagnosis not present

## 2018-08-14 DIAGNOSIS — Z91040 Latex allergy status: Secondary | ICD-10-CM | POA: Insufficient documentation

## 2018-08-14 LAB — CBC WITH DIFFERENTIAL/PLATELET
BASOS PCT: 1 %
Basophils Absolute: 0 10*3/uL (ref 0.0–0.1)
EOS ABS: 0.1 10*3/uL (ref 0.0–0.7)
EOS PCT: 2 %
HEMATOCRIT: 37.3 % (ref 36.0–46.0)
Hemoglobin: 12.7 g/dL (ref 12.0–15.0)
Lymphocytes Relative: 32 %
Lymphs Abs: 1 10*3/uL (ref 0.7–4.0)
MCH: 30.7 pg (ref 26.0–34.0)
MCHC: 34 g/dL (ref 30.0–36.0)
MCV: 90.1 fL (ref 78.0–100.0)
MONO ABS: 0.4 10*3/uL (ref 0.1–1.0)
MONOS PCT: 13 %
Neutro Abs: 1.6 10*3/uL — ABNORMAL LOW (ref 1.7–7.7)
Neutrophils Relative %: 52 %
PLATELETS: 207 10*3/uL (ref 150–400)
RBC: 4.14 MIL/uL (ref 3.87–5.11)
RDW: 13.2 % (ref 11.5–15.5)
WBC: 3.1 10*3/uL — ABNORMAL LOW (ref 4.0–10.5)

## 2018-08-14 LAB — BASIC METABOLIC PANEL
Anion gap: 11 (ref 5–15)
BUN: 20 mg/dL (ref 8–23)
CALCIUM: 9 mg/dL (ref 8.9–10.3)
CO2: 24 mmol/L (ref 22–32)
CREATININE: 0.94 mg/dL (ref 0.44–1.00)
Chloride: 105 mmol/L (ref 98–111)
GFR calc Af Amer: >60 mL/min (ref >60)
GFR, EST NON AFRICAN AMERICAN: 59 mL/min — AB (ref >60)
Glucose, Bld: 107 mg/dL — ABNORMAL HIGH (ref 70–99)
Potassium: 3.7 mmol/L (ref 3.5–5.1)
SODIUM: 140 mmol/L (ref 135–145)

## 2018-08-14 LAB — TROPONIN I: Troponin I: <0.03 ng/mL (ref <0.03)

## 2018-08-14 MED ORDER — DILTIAZEM HCL ER COATED BEADS 120 MG PO CP24: 120.0000 mg | ORAL_CAPSULE | Freq: Every day | ORAL | 0 refills | Status: DC

## 2018-08-14 MED ORDER — FLECAINIDE ACETATE 50 MG PO TABS: 100.0000 mg | ORAL_TABLET | Freq: Two times a day (BID) | ORAL | 0 refills | Status: DC

## 2018-08-14 NOTE — ED Triage Notes (Signed)
Pt c/o heart racing, sweating and "feeling faint"-denies pain-started last night to triage in w/c-NAD

## 2018-08-14 NOTE — ED Notes (Signed)
Patient transported to X-ray 

## 2018-08-14 NOTE — Discharge Instructions (Addendum)
Follow up with your cardiologist.  He wanted you to increase your flecainide and start taking diltiazem.

## 2018-08-14 NOTE — ED Provider Notes (Signed)
[facility] EMERGENCY DEPARTMENT Provider Note   CSN: [id number] Arrival date & time: 08/14/18  1117     History   Chief Complaint Chief Complaint  Patient presents with  . Tachycardia    HPI Rebecca Carter is a 72 y.o. female.  72 yo F with a chief complaint of palpitations and shortness of breath.  This started throughout the evening.  She felt that her heart was racing.  Has a history of atrial fibrillation in the past.  Has had a cryoablation no recurrence since.  She denies concurrent illness denies cough congestion shortness of breath fever denies chest pain denies vomiting or diarrhea denies increased oral intake denies any diet.  Denies abdominal pain.  The history is provided by the patient.  Illness  This is a recurrent problem. The current episode started yesterday. The problem occurs constantly. The problem has not changed since onset.Associated symptoms include shortness of breath. Pertinent negatives include no chest pain and no headaches. Nothing aggravates the symptoms. Nothing relieves the symptoms. She has tried nothing for the symptoms. The treatment provided no relief.    Past Medical History:  Diagnosis Date  . Allergic rhinitis     Patient Active Problem List   Diagnosis Date Noted  . Acute sinusitis 01/30/2018  . Cough 01/30/2018  . Allergic rhinitis with a possible nonallergic component 02/04/2016    Past Surgical History:  Procedure Laterality Date  . ADENOIDECTOMY    . APPENDECTOMY    . HERNIA REPAIR    . pulmonary vein abrasion    . SHOULDER ARTHROSCOPY    . TONSILLECTOMY       OB History   None      Home Medications    Prior to Admission medications   Medication Sig Start Date End Date Taking? Authorizing Provider  atorvastatin (LIPITOR) 20 MG tablet Take 10 mg by mouth daily.     [provider]  azelastine (ASTELIN) 0.1 % nasal spray Place 2 sprays into both nostrils 2 (two) times daily. 01/30/18   Bobbitt,  Heywood Iles, MD  Cholecalciferol (VITAMIN D3) 2000 units capsule Take by mouth.    [provider]  diltiazem (CARDIZEM CD) 120 MG 24 hr capsule Take 1 capsule (120 mg total) by mouth daily. 08/14/18   Melene Plan, DO  EPINEPHrine 0.3 mg/0.3 mL IJ SOAJ injection Use as directed for severe allergic reaction. 04/12/17   Bobbitt, Heywood Iles, MD  fenofibrate (TRICOR) 145 MG tablet Take by mouth. 02/19/18 02/20/19  [provider]  fexofenadine (ALLEGRA) 180 MG tablet Take by mouth as needed.  02/04/16   [provider]  flecainide (TAMBOCOR) 50 MG tablet Take 2 tablets (100 mg total) by mouth 2 (two) times daily. 08/14/18 09/13/18  Melene Plan, DO  levocetirizine (XYZAL) 5 MG tablet Take 5 mg by mouth every evening.    [provider]  levothyroxine (SYNTHROID, LEVOTHROID) 75 MCG tablet Take by mouth. 02/19/18 02/19/19  [provider]  meloxicam (MOBIC) 7.5 MG tablet Take by mouth.    [provider]  mupirocin ointment (BACTROBAN) 2 % Place 1 application into the nose 2 (two) times daily. 08/07/18   Alfonse Spruce, MD  NON FORMULARY     [provider]  omeprazole (PRILOSEC OTC) 20 MG tablet 20 mg daily.    [provider]  rivaroxaban (XARELTO) 10 MG TABS tablet Take 10 mg by mouth.    [provider]    Family History Family History  Problem Relation Age of Onset  . Asthma Mother   . Asthma Sister     Social History Social History   Tobacco Use  . Smoking status: Never Smoker  . Smokeless tobacco: Never Used  Substance Use Topics  . Alcohol use: Yes    Alcohol/week: 0.0 standard drinks  . Drug use: No     Allergies   Sulfa antibiotics; Augmentin [amoxicillin-pot clavulanate]; Trazodone; and Latex   Review of Systems Review of Systems  Constitutional: Negative for chills and fever.  HENT: Negative for congestion and rhinorrhea.   Eyes: Negative for redness and visual disturbance.  Respiratory:  Positive for shortness of breath. Negative for wheezing.   Cardiovascular: Positive for palpitations. Negative for chest pain.  Gastrointestinal: Negative for nausea and vomiting.  Genitourinary: Negative for dysuria and urgency.  Musculoskeletal: Negative for arthralgias and myalgias.  Skin: Negative for pallor and wound.  Neurological: Negative for dizziness and headaches.     Physical Exam Updated Vital Signs BP 130/73   Pulse 94   Temp 97.7 F (36.5 C) (Oral)   Resp 18   Ht 5\' 9"  (1.753 m)   Wt 76.7 kg   SpO2 98%   BMI 24.96 kg/m   Physical Exam  Constitutional: She is oriented to person, place, and time. She appears well-developed and well-nourished. No distress.  HENT:  Head: Normocephalic and atraumatic.  Eyes: Pupils are equal, round, and reactive to light. EOM are normal.  Neck: Normal range of motion. Neck supple.  Cardiovascular: An irregularly irregular rhythm present. Tachycardia present. Exam reveals no gallop and no friction rub.  No murmur heard. Pulmonary/Chest: Effort normal. She has no wheezes. She has no rales.  Abdominal: Soft. She exhibits no distension. There is no tenderness.  Musculoskeletal: She exhibits no edema or tenderness.  Neurological: She is alert and oriented to person, place, and time.  Skin: Skin is warm and dry. She is not diaphoretic.  Psychiatric: She has a normal mood and affect. Her behavior is normal.  Nursing note and vitals reviewed.    ED Treatments / Results  Labs (all labs ordered are listed, but only abnormal results are displayed) Labs Reviewed  CBC WITH DIFFERENTIAL/PLATELET - Abnormal; Notable for the following components:      Result Value   WBC 3.1 (*)    Neutro Abs 1.6 (*)    All other components within normal limits  BASIC METABOLIC PANEL - Abnormal; Notable for the following components:   Glucose, Bld 107 (*)    GFR calc non Af Amer 59 (*)    All other components within normal limits  TROPONIN I     EKG EKG Interpretation  Date/Time:  Wednesday August 14 2018 11:27:33 EDT Ventricular Rate:  113 PR Interval:    QRS Duration: 88 QT Interval:  348 QTC Calculation: 477 R Axis:   -64 Text Interpretation:  Sinus tachycardia Left anterior fascicular block Septal infarct , age undetermined Abnormal ECG No old tracing to compare Confirmed by Melene Plan [phone]) on 08/14/2018 11:36:40 AM   Radiology Dg Chest 2 View  Result Date: 08/14/2018 CLINICAL DATA:  72 year old female with a history of shortness of breath EXAM: CHEST - 2 VIEW COMPARISON:  Chest x-ray 11/15/2015, CT 11/15/2015 FINDINGS: Cardiomediastinal silhouette unchanged in size and contour. No evidence of central vascular congestion. No pneumothorax or pleural effusion. No confluent airspace disease. Coarsened interstitial markings similar to prior. Degenerative changes of the spine.  No acute displaced fracture. IMPRESSION: Chronic lung changes without  evidence of acute cardiopulmonary disease. Electronically Signed   By: Gilmer MorJaime  Wagner D.O.   On: 08/14/2018 12:40    Procedures Procedures (including critical care time)  Medications Ordered in ED Medications - No data to display   Initial Impression / Assessment and Plan / ED Course  I have reviewed the triage vital signs and the nursing notes.  Pertinent labs & imaging results that were available during my care of the patient were reviewed by me and considered in my medical decision making (see chart for details).     72 yo F with a chief complaint of shortness of breath and palpitations.  Patient initial EKG with sinus tachycardia though on my exam clinically she has A. fib with RVR.  Upon the nurse attempting to repeat her EKG the patient converted into normal sinus rhythm.  Her symptoms have resolved and she feels better.  I discussed the case with her cardiologist, Dr. Rudolpho SevinAkbary, he recommended starting her on Cardizem 120 mg, as well as increasing her flecainide 100 mg  twice daily.  2:51 PM:  I have discussed the diagnosis/risks/treatment options with the patient and family and believe the pt to be eligible for discharge home to follow-up with Cards. We also discussed returning to the ED immediately if new or worsening sx occur. We discussed the sx which are most concerning (e.g., chest pain, palpitations, sob) that necessitate immediate return. Medications administered to the patient during their visit and any new prescriptions provided to the patient are listed below.  Medications given during this visit Medications - No data to display    The patient appears reasonably screen and/or stabilized for discharge and I doubt any other medical condition or other [facility] requiring further screening, evaluation, or treatment in the ED at this time prior to discharge.    Final Clinical Impressions(s) / ED Diagnoses   Final diagnoses:  Atrial fibrillation with RVR [facility](HCC)    ED Discharge Orders         Ordered    flecainide (TAMBOCOR) 50 MG tablet  2 times daily     08/14/18 1330    diltiazem (CARDIZEM CD) 120 MG 24 hr capsule  Daily     08/14/18 1330           [place] MonroeFloyd, Shell Yandow, DO 08/14/18 1451

## 2018-08-15 ENCOUNTER — Ambulatory Visit (INDEPENDENT_AMBULATORY_CARE_PROVIDER_SITE_OTHER): Payer: Medicare Other

## 2018-08-15 DIAGNOSIS — J309 Allergic rhinitis, unspecified: Secondary | ICD-10-CM

## 2018-08-22 ENCOUNTER — Ambulatory Visit (INDEPENDENT_AMBULATORY_CARE_PROVIDER_SITE_OTHER): Payer: Medicare Other | Admitting: *Deleted

## 2018-08-22 DIAGNOSIS — J309 Allergic rhinitis, unspecified: Secondary | ICD-10-CM

## 2018-08-28 ENCOUNTER — Ambulatory Visit (INDEPENDENT_AMBULATORY_CARE_PROVIDER_SITE_OTHER): Payer: Medicare Other

## 2018-08-28 DIAGNOSIS — J309 Allergic rhinitis, unspecified: Secondary | ICD-10-CM | POA: Diagnosis not present

## 2018-09-09 DIAGNOSIS — Z23 Encounter for immunization: Secondary | ICD-10-CM | POA: Insufficient documentation

## 2018-09-26 ENCOUNTER — Ambulatory Visit (INDEPENDENT_AMBULATORY_CARE_PROVIDER_SITE_OTHER): Payer: Medicare Other | Admitting: *Deleted

## 2018-09-26 DIAGNOSIS — J309 Allergic rhinitis, unspecified: Secondary | ICD-10-CM | POA: Diagnosis not present

## 2018-10-22 ENCOUNTER — Ambulatory Visit (INDEPENDENT_AMBULATORY_CARE_PROVIDER_SITE_OTHER): Payer: Medicare Other

## 2018-10-22 DIAGNOSIS — J309 Allergic rhinitis, unspecified: Secondary | ICD-10-CM | POA: Diagnosis not present

## 2018-11-20 ENCOUNTER — Ambulatory Visit (INDEPENDENT_AMBULATORY_CARE_PROVIDER_SITE_OTHER): Payer: Medicare Other | Admitting: *Deleted

## 2018-11-20 DIAGNOSIS — J309 Allergic rhinitis, unspecified: Secondary | ICD-10-CM

## 2018-12-09 ENCOUNTER — Ambulatory Visit: Payer: Medicare Other

## 2018-12-09 ENCOUNTER — Ambulatory Visit (INDEPENDENT_AMBULATORY_CARE_PROVIDER_SITE_OTHER): Payer: Medicare Other

## 2018-12-09 ENCOUNTER — Other Ambulatory Visit: Payer: Self-pay | Admitting: Podiatry

## 2018-12-09 ENCOUNTER — Ambulatory Visit (INDEPENDENT_AMBULATORY_CARE_PROVIDER_SITE_OTHER): Payer: Medicare Other | Admitting: Podiatry

## 2018-12-09 ENCOUNTER — Encounter: Payer: Self-pay | Admitting: Podiatry

## 2018-12-09 DIAGNOSIS — M7660 Achilles tendinitis, unspecified leg: Secondary | ICD-10-CM

## 2018-12-09 DIAGNOSIS — M7661 Achilles tendinitis, right leg: Secondary | ICD-10-CM | POA: Diagnosis not present

## 2018-12-09 DIAGNOSIS — M779 Enthesopathy, unspecified: Secondary | ICD-10-CM

## 2018-12-09 DIAGNOSIS — M79671 Pain in right foot: Secondary | ICD-10-CM

## 2018-12-09 MED ORDER — DICLOFENAC SODIUM 1 % TD GEL: 2.0000 g | Freq: Four times a day (QID) | TRANSDERMAL | 2 refills | Status: DC

## 2018-12-09 NOTE — Progress Notes (Signed)
Subjective:    Patient ID: Rebecca Carter, female    DOB: [birth year], 72 y.o.   MRN: [id number]  HPI 72 year old female presents the office today for second opinion given pain to the back of her left heel.  She presented with a bone spur resection as well as Achilles tendon repair in May 2019 with Dr. Burr MedicoMichael Lewis.  She previously had the right side done as had no issues but she feels that there is something that is still causing pain in the back of the heel and she wants have another opinion.  Dr. Samuel BoucheLucas had previously discussed surgical intervention to examine the area but she wants to hold off on this if possible.  She denies any recent injury or trauma.  Denies any open sores or any significant swelling.  She has no other concerns today.   Review of Systems  All other systems reviewed and are negative.  Past Medical History:  Diagnosis Date  . Allergic rhinitis     Past Surgical History:  Procedure Laterality Date  . ADENOIDECTOMY    . APPENDECTOMY    . HERNIA REPAIR    . pulmonary vein abrasion    . SHOULDER ARTHROSCOPY    . TONSILLECTOMY       Current Outpatient Medications:  .  atorvastatin (LIPITOR) 20 MG tablet, Take 10 mg by mouth daily. , Disp: , Rfl:  .  azelastine (ASTELIN) 0.1 % nasal spray, Place 2 sprays into both nostrils 2 (two) times daily., Disp: 30 mL, Rfl: 5 .  Cholecalciferol (VITAMIN D3) 2000 units capsule, Take by mouth., Disp: , Rfl:  .  diclofenac sodium (VOLTAREN) 1 % GEL, Apply 2 g topically 4 (four) times daily. Rub into affected area of foot 2 to 4 times daily, Disp: 100 g, Rfl: 2 .  diltiazem (CARDIZEM CD) 120 MG 24 hr capsule, Take 1 capsule (120 mg total) by mouth daily., Disp: 30 capsule, Rfl: 0 .  EPINEPHrine 0.3 mg/0.3 mL IJ SOAJ injection, Use as directed for severe allergic reaction., Disp: 2 Device, Rfl: 1 .  fenofibrate (TRICOR) 145 MG tablet, Take by mouth., Disp: , Rfl:  .  fexofenadine (ALLEGRA) 180 MG tablet, Take by mouth as needed. ,  Disp: , Rfl:  .  flecainide (TAMBOCOR) 50 MG tablet, Take 2 tablets (100 mg total) by mouth 2 (two) times daily., Disp: 120 tablet, Rfl: 0 .  levocetirizine (XYZAL) 5 MG tablet, Take 5 mg by mouth every evening., Disp: , Rfl:  .  levothyroxine (SYNTHROID, LEVOTHROID) 75 MCG tablet, Take by mouth., Disp: , Rfl:  .  meloxicam (MOBIC) 7.5 MG tablet, Take by mouth., Disp: , Rfl:  .  mupirocin ointment (BACTROBAN) 2 %, Place 1 application into the nose 2 (two) times daily., Disp: 22 g, Rfl: 0 .  NON FORMULARY, , Disp: , Rfl:  .  omeprazole (PRILOSEC OTC) 20 MG tablet, 20 mg daily., Disp: , Rfl:  .  rivaroxaban (XARELTO) 10 MG TABS tablet, Take 10 mg by mouth., Disp: , Rfl:   Allergies  Allergen Reactions  . Sulfa Antibiotics Rash    rash  . Augmentin [Amoxicillin-Pot Clavulanate] Other (See Comments)    Stomach issues  . Trazodone     nightmares  . Latex Rash    Other reaction(s): RASH         Objective:   Physical Exam  General: AAO x3, NAD  Dermatological: Skin is warm, dry and supple bilateral. Nails x 10 are well manicured; remaining integument  appears unremarkable at this time. There are no open sores, no preulcerative lesions, no rash or signs of infection present.  Incision from the prior surgery is well-healed.  Vascular: Dorsalis Pedis artery and Posterior Tibial artery pedal pulses are 2/4 bilateral with immedate capillary fill time. Pedal hair growth present. No varicosities and no lower extremity edema present bilateral. There is no pain with calf compression, swelling, warmth, erythema.   Neruologic: Grossly intact via light touch bilateral. Vibratory intact via tuning fork bilateral. Protective threshold with Semmes Wienstein monofilament intact to all pedal sites bilateral.   Musculoskeletal: To the posterior lateral aspect mostly is one small area that has an area of tenderness directly upon palpation there is no edema, erythema to this area.  There is no overlying skin  change.  Able to palpate a small bony ledge present.  Janee Mornhompson test is negative there is no significant discomfort in the course of the Achilles tendon.  Muscular strength 5/5 in all groups tested bilateral.  Gait: Unassisted, Nonantalgic.     Assessment & Plan:  72 year old female posterior heel pain status post heel spur resection -Treatment options discussed including all alternatives, risks, and complications -Etiology of symptoms were discussed -X-rays were obtained and reviewed with the patient.  No evidence of acute fracture or stress fracture identified today.  Also reviewed the MRI that she brought him to the office.  The MRI was read as postsurgical changes and new severe tendinosis of the mid to distal Achilles tendon.  No tear.  Mild peritendinitis.  Unchanged mild extensor digitorum longus tenosynovitis. -We discussed treatment options.  Could be suture reaction or possible anchor.  She wants to hold off any surgical intervention.  Discussed other treatment options.  She is in start with an offloading pad that I dispensed as well as Voltaren gel and continue stretching exercises.  We can consider EPAT as well but she wants to hold off. Will continue to monitor.   Vivi BarrackMatthew R  DPM

## 2018-12-16 ENCOUNTER — Ambulatory Visit (INDEPENDENT_AMBULATORY_CARE_PROVIDER_SITE_OTHER): Payer: Medicare Other

## 2018-12-16 DIAGNOSIS — J309 Allergic rhinitis, unspecified: Secondary | ICD-10-CM

## 2019-01-09 ENCOUNTER — Ambulatory Visit: Payer: Medicare Other | Admitting: Podiatry

## 2019-01-14 ENCOUNTER — Ambulatory Visit (INDEPENDENT_AMBULATORY_CARE_PROVIDER_SITE_OTHER): Payer: Medicare Other

## 2019-01-14 DIAGNOSIS — J309 Allergic rhinitis, unspecified: Secondary | ICD-10-CM | POA: Diagnosis not present

## 2019-01-22 ENCOUNTER — Ambulatory Visit: Payer: Medicare Other | Admitting: Allergy and Immunology

## 2019-01-29 ENCOUNTER — Encounter: Payer: Self-pay | Admitting: Allergy and Immunology

## 2019-01-29 ENCOUNTER — Ambulatory Visit (INDEPENDENT_AMBULATORY_CARE_PROVIDER_SITE_OTHER): Payer: Medicare Other | Admitting: Allergy and Immunology

## 2019-01-29 DIAGNOSIS — J3089 Other allergic rhinitis: Secondary | ICD-10-CM | POA: Diagnosis not present

## 2019-01-29 DIAGNOSIS — J309 Allergic rhinitis, unspecified: Secondary | ICD-10-CM | POA: Diagnosis not present

## 2019-01-29 DIAGNOSIS — J0100 Acute maxillary sinusitis, unspecified: Secondary | ICD-10-CM | POA: Diagnosis not present

## 2019-01-29 MED ORDER — AZELASTINE HCL 0.1 % NA SOLN: 2.0000 | Freq: Two times a day (BID) | NASAL | 5 refills | Status: DC

## 2019-01-29 NOTE — Assessment & Plan Note (Signed)
   Continue appropriate aeroallergen avoidance measures, azelastine nasal spray as needed, and/or fexofenadine (Allegra) as needed.  Nasal saline spray (i.e., Simply Saline) or nasal saline lavage (i.e., NeilMed) is recommended as needed and prior to medicated nasal sprays.  Discontinue immunotherapy injections.

## 2019-01-29 NOTE — Patient Instructions (Signed)
Allergic rhinitis with a possible nonallergic component  Continue appropriate aeroallergen avoidance measures, azelastine nasal spray as needed, and/or fexofenadine (Allegra) as needed.  Nasal saline spray (i.e., Simply Saline) or nasal saline lavage (i.e., NeilMed) is recommended as needed and prior to medicated nasal sprays.  Discontinue immunotherapy injections.   Return in about 1 year (around 01/30/2020), or if symptoms worsen or fail to improve.

## 2019-01-29 NOTE — Progress Notes (Signed)
Follow-up Note  RE: Rebecca Carter MRN: [id number] DOB: 04/26/1946 Date of Office Visit: 01/29/2019  Primary care provider: Joneen Roach, NP Referring provider: Joneen Roach, NP  History of present illness: Rebecca Carter is a 73 y.o. female with allergic rhinitis presenting today for follow-up.  She was last seen in this clinic in August 2019.  She reports that her allergic rhinitis symptoms have been well controlled with azelastine nasal spray and/or fexofenadine as needed.  As per our discussion during her previous visit, she has been on immunotherapy injections for longer than 5 years and has decided to discontinue immunotherapy injections.  She received her last injection today.  Assessment and plan: Allergic rhinitis with a possible nonallergic component  Continue appropriate aeroallergen avoidance measures, azelastine nasal spray as needed, and/or fexofenadine (Allegra) as needed.  Nasal saline spray (i.e., Simply Saline) or nasal saline lavage (i.e., NeilMed) is recommended as needed and prior to medicated nasal sprays.  Discontinue immunotherapy injections.   Meds ordered this encounter  Medications  . azelastine (ASTELIN) 0.1 % nasal spray    Sig: Place 2 sprays into both nostrils 2 (two) times daily.    Dispense:  30 mL    Refill:  5    Please keep rx on file. Pt. Will call when needed    Physical examination: Blood pressure 106/60, pulse 72, temperature 98.3 F (36.8 C), temperature source Oral, resp. rate 16, height 5\' 9"  (1.753 m), weight 171 lb 11.8 oz (77.9 kg), SpO2 98 %.  General: Alert, interactive, in no acute distress. HEENT: TMs pearly gray, turbinates mildly edematous without discharge, post-pharynx mildly erythematous. Neck: Supple without lymphadenopathy. Lungs: Clear to auscultation without wheezing, rhonchi or rales. CV: Normal S1, S2 without murmurs. Skin: Warm and dry, without lesions or rashes.  The following portions of the patient's  history were reviewed and updated as appropriate: allergies, current medications, past family history, past medical history, past social history, past surgical history and problem list.  Allergies as of 01/29/2019      Reactions   Sulfa Antibiotics Rash   rash   Augmentin [amoxicillin-pot Clavulanate] Other (See Comments)   Stomach issues   Trazodone    nightmares   Latex Rash   Other reaction(s): RASH      Medication List       Accurate as of January 29, 2019 12:54 PM. Always use your most recent med list.        atorvastatin 20 MG tablet Commonly known as:  LIPITOR Take 10 mg by mouth daily.   azelastine 0.1 % nasal spray Commonly known as:  ASTELIN Place 2 sprays into both nostrils 2 (two) times daily.   cetirizine 10 MG tablet Commonly known as:  ZYRTEC Take by mouth.   escitalopram 10 MG tablet Commonly known as:  LEXAPRO Take by mouth.   fenofibrate 145 MG tablet Commonly known as:  TRICOR Take by mouth.   fexofenadine 180 MG tablet Commonly known as:  ALLEGRA Take by mouth as needed.   flecainide 50 MG tablet Commonly known as:  TAMBOCOR Take 50 mg by mouth 2 (two) times daily. 1 1/2 tablets twice a day   levothyroxine 75 MCG tablet Commonly known as:  SYNTHROID, LEVOTHROID Take by mouth.   meloxicam 7.5 MG tablet Commonly known as:  MOBIC Take by mouth.   mupirocin ointment 2 % Commonly known as:  BACTROBAN Place 1 application into the nose 2 (two) times daily.   NON FORMULARY  PRILOSEC OTC 20 MG tablet Generic drug:  omeprazole 20 mg daily.   promethazine 12.5 MG tablet Commonly known as:  PHENERGAN   rivaroxaban 10 MG Tabs tablet Commonly known as:  XARELTO Take 10 mg by mouth.   Vitamin D3 50 MCG (2000 UT) capsule Take by mouth.       Allergies  Allergen Reactions  . Sulfa Antibiotics Rash    rash  . Augmentin [Amoxicillin-Pot Clavulanate] Other (See Comments)    Stomach issues  . Trazodone     nightmares  . Latex  Rash    Other reaction(s): RASH     I appreciate the opportunity to take part in [name]'s care. Please do not hesitate to contact me with questions.  Sincerely,   R. Jorene Guestarter Rowynn Mcweeney, MD

## 2020-01-06 IMAGING — DX DG CHEST 2V
2 series · 2 of 2 positions shown · non-contrast
Comparison: Chest x-ray 11/15/2015, CT 11/15/2015

CLINICAL DATA: 72-year-old female with a history of shortness of
breath

EXAM:
CHEST - 2 VIEW

[chest pa]
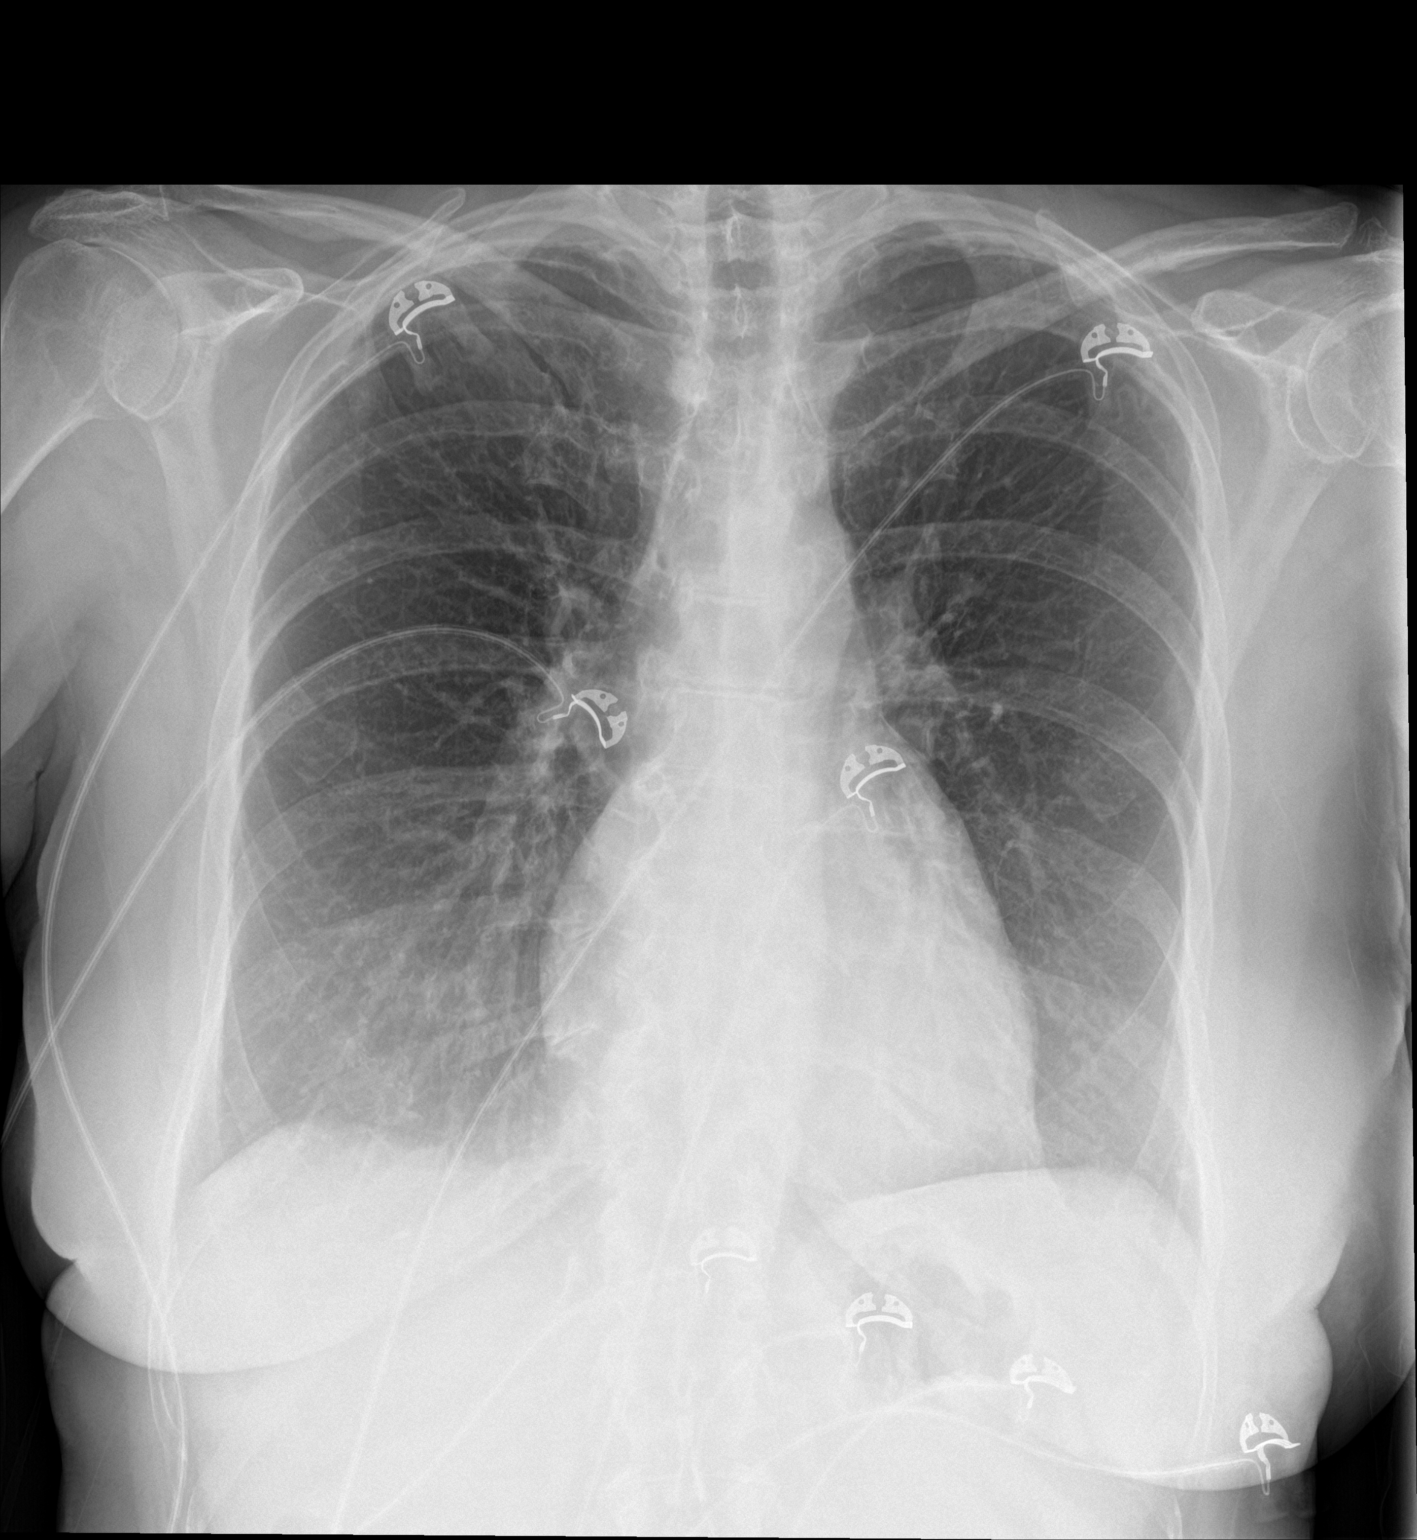

[chest lat]
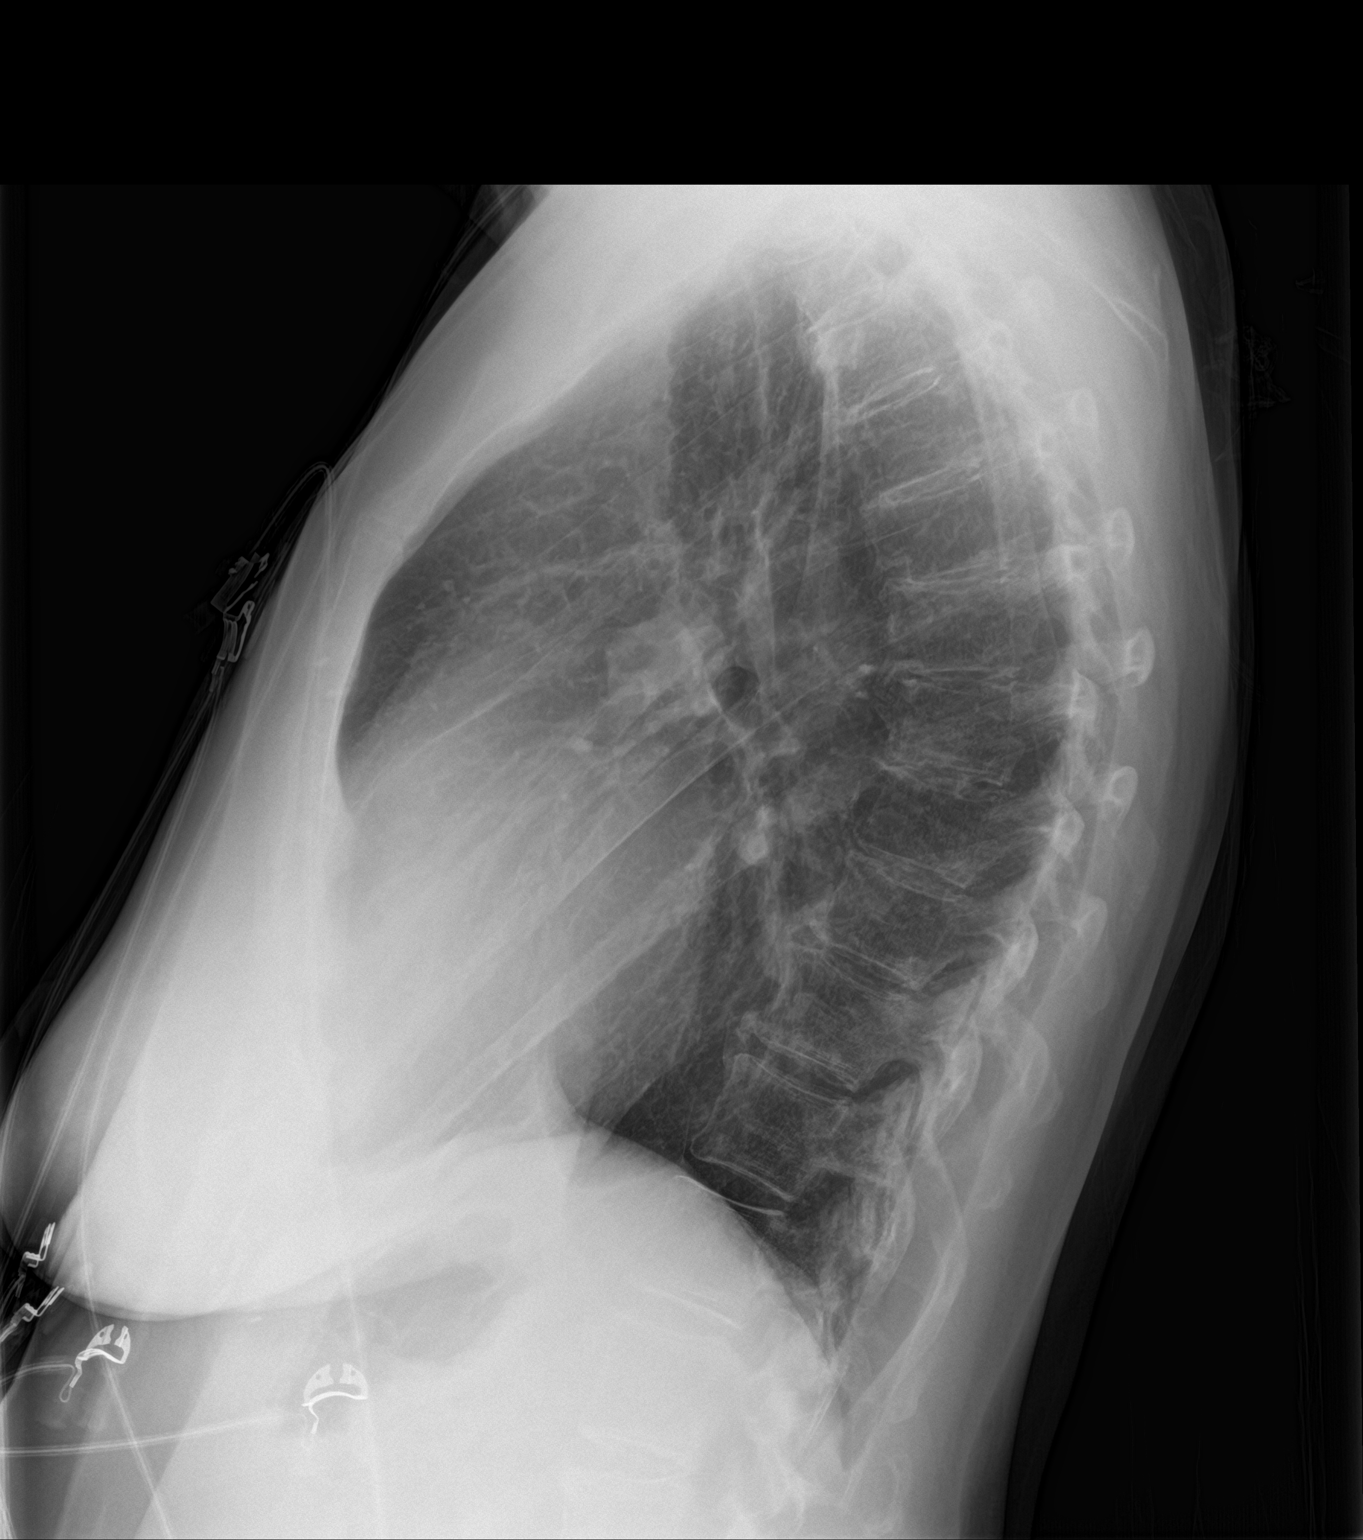

[2 of 2 positions shown; findings below may reference images not displayed]

FINDINGS: Cardiomediastinal silhouette unchanged in size and contour. No
evidence of central vascular congestion. No pneumothorax or pleural
effusion. No confluent airspace disease.

Coarsened interstitial markings similar to prior.

Degenerative changes of the spine.  No acute displaced fracture.
IMPRESSION: Chronic lung changes without evidence of acute cardiopulmonary
disease.

## 2020-01-09 ENCOUNTER — Ambulatory Visit: Payer: Self-pay

## 2020-03-05 ENCOUNTER — Telehealth: Payer: Self-pay

## 2020-03-05 NOTE — Telephone Encounter (Signed)
NOTES ON FILE FROM [facility] [phone], SENT REFERRAL TO SCHEDULING

## 2020-04-01 ENCOUNTER — Encounter: Payer: Self-pay | Admitting: *Deleted

## 2020-04-01 ENCOUNTER — Telehealth: Payer: Self-pay | Admitting: Pharmacist

## 2020-04-01 ENCOUNTER — Encounter: Payer: Self-pay | Admitting: Cardiology

## 2020-04-01 ENCOUNTER — Other Ambulatory Visit: Payer: Self-pay

## 2020-04-01 ENCOUNTER — Ambulatory Visit (INDEPENDENT_AMBULATORY_CARE_PROVIDER_SITE_OTHER): Payer: Medicare PPO | Admitting: Cardiology

## 2020-04-01 VITALS — BP 122/82 | HR 125 | Ht 69.0 in | Wt 180.0 lb

## 2020-04-01 DIAGNOSIS — I4819 Other persistent atrial fibrillation: Secondary | ICD-10-CM

## 2020-04-01 MED ORDER — DILTIAZEM HCL ER COATED BEADS 240 MG PO CP24: 240.0000 mg | ORAL_CAPSULE | Freq: Every day | ORAL | 3 refills | Status: DC

## 2020-04-01 NOTE — Addendum Note (Signed)
Addended by: Baird Lyons on: 04/01/2020 12:07 PM   Modules accepted: Orders

## 2020-04-01 NOTE — Progress Notes (Addendum)
Electrophysiology Office Note   Date:  04/01/2020   ID:  Rebecca Carter, DOB 08-04-1946, MRN [id number]  PCP:  Jonathon Resides, MD  Cardiologist: Minna Merritts Primary Electrophysiologist:  Elmina Hendel Meredith Leeds, MD    Chief Complaint: Second opinion   History of Present Illness: Rebecca Carter is a 74 y.o. female who is being seen today for the evaluation of atrial fibrillation at the request of Zanard, Bernadene Bell, MD. Presenting today for electrophysiology evaluation.  She has a history significant for atrial fibrillation and atrial flutter and is now status post ablation 12/29/2019 by Dr. Minna Merritts, hypothyroidism, hyperlipidemia.  She is currently on sotalol and Xarelto.  During her ablation, she had repeat PVI.  She went into an atypical atrial flutter and had ablation from the right superior pulmonary vein to the mitral valve which changed activation to a roof-dependent flutter.  Ablation was performed from the right to the left superior pulmonary veins which resulted in termination of tachycardia.  She also had a cavotricuspid isthmus ablation.  Today, she denies symptoms of chest pain, orthopnea, PND, lower extremity edema, claudication, dizziness, presyncope, syncope, bleeding, or neurologic sequela. The patient is tolerating medications without difficulties.  Since her ablation, she continues to go in and out of sinus rhythm.  She is currently in atrial fibrillation at a rate of 125 beats a minute today.  She is currently weak, fatigued, and short of breath.  She is quite put out and frustrated with her overall care.   Past Medical History:  Diagnosis Date  . Allergic rhinitis    Past Surgical History:  Procedure Laterality Date  . ADENOIDECTOMY    . APPENDECTOMY    . HERNIA REPAIR    . pulmonary vein abrasion    . SHOULDER ARTHROSCOPY    . TONSILLECTOMY       Current Outpatient Medications  Medication Sig Dispense Refill  . atorvastatin (LIPITOR) 20 MG tablet Take 10 mg by mouth  daily.     Marland Kitchen azelastine (ASTELIN) 0.1 % nasal spray Place 2 sprays into both nostrils 2 (two) times daily. 30 mL 5  . Cholecalciferol (VITAMIN D3) 2000 units capsule Take by mouth.    . diltiazem (CARDIZEM) 120 MG tablet Take 120 mg by mouth daily.    Marland Kitchen diltiazem (TIAZAC) 120 MG 24 hr capsule Take 1 capsule (120 mg total) by mouth daily.    . fenofibrate (TRICOR) 145 MG tablet Take 145 mg by mouth every other day.     . fexofenadine (ALLEGRA) 180 MG tablet Take by mouth as needed.     Marland Kitchen levothyroxine (SYNTHROID, LEVOTHROID) 75 MCG tablet Take 75 mcg by mouth daily before breakfast.     . NON FORMULARY     . omeprazole (PRILOSEC OTC) 20 MG tablet 20 mg daily.    . rivaroxaban (XARELTO) 10 MG TABS tablet Take 10 mg by mouth.    . sotalol (BETAPACE) 80 MG tablet Take 1 tablet (80 mg total) by mouth 2 times daily. She Sorayah Schrodt takes 80 mg am and 40 mg at nights.     No current facility-administered medications for this visit.    Allergies:   Sulfa antibiotics, Augmentin [amoxicillin-pot clavulanate], Trazodone, and Latex   Social History:  The patient  reports that she has never smoked. She has never used smokeless tobacco. She reports current alcohol use. She reports that she does not use drugs.   Family History:  The patient's family history includes Asthma in her mother and sister.  ROS:  Please see the history of present illness.   Otherwise, review of systems is positive for none.   All other systems are reviewed and negative.    PHYSICAL EXAM: VS:  BP 122/82   Pulse (!) 125   Ht 5\' 9"  (1.753 m)   Wt 180 lb (81.6 kg)   SpO2 98%   BMI 26.58 kg/m  , BMI Body mass index is 26.58 kg/m. GEN: Well nourished, well developed, in no acute distress  HEENT: normal  Neck: no JVD, carotid bruits, or masses Cardiac: Tachycardic, irregular; no murmurs, rubs, or gallops,no edema  Respiratory:  clear to auscultation bilaterally, normal work of breathing GI: soft, nontender, nondistended, +  BS MS: no deformity or atrophy  Skin: warm and dry Neuro:  Strength and sensation are intact Psych: euthymic mood, full affect  EKG:  EKG is ordered today. Personal review of the ekg ordered shows atrial fibrillation, rate 125  Recent Labs: No results found for requested labs within last 8760 hours.    Lipid Panel  No results found for: CHOL, TRIG, HDL, CHOLHDL, VLDL, LDLCALC, LDLDIRECT   Wt Readings from Last 3 Encounters:  04/01/20 180 lb (81.6 kg)  01/29/19 171 lb 11.8 oz (77.9 kg)  08/14/18 169 lb (76.7 kg)      Other studies Reviewed: Additional studies/ records that were reviewed today include: TEE 01/07/2020 Review of the above records today demonstrates:  The left ventricular size is normal. There is normal left ventricular wall thickness. LV ejection fraction = 60-65%. No segmental wall motion abnormalities seen in the left ventricle The left atrium is moderately dilated. A left-to-right shunt was visualized by color flow Doppler.   ASSESSMENT AND PLAN:  1.  Persistent atrial fibrillation/flutter: Has had ablation x2.  She is in and out of atrial fibrillation now.  She did have a very extensive ablation previously.  I do think that she would benefit from further medication management.  She is on sotalol, but at low doses.  She has apparently had some bradycardia and thus dofetilide would be a better option.  We Shelsey Rieth stop sotalol today and increase her Cardizem to 240 mg.  We Bhavana Kady continue her anticoagulation with Xarelto.  We Huyen Perazzo need to ensure that she is on a proper dose.  I Rishi Vicario also try and get a recent normal sinus ECG from her primary cardiologist her primary care physician.  I spoke with her about dofetilide loading.  She feels that this is a reasonable option.  We Shaunda Tipping plan for admission.  2.  Hyperlipidemia: Continue atorvastatin.    Current medicines are reviewed at length with the patient today.   The patient does not have concerns regarding her  medicines.  The following changes were made today: Sotalol, increase diltiazem to 240 mg daily.  Labs/ tests ordered today include:  Orders Placed This Encounter  Procedures  . EKG 12-Lead     Disposition:   FU with Moncerrath Berhe 3 months  Signed, Kiyan Burmester 01/09/2020, MD  04/01/2020 11:37 AM     [facility] [address] [place] [phone] (office) [phone] (fax)

## 2020-04-01 NOTE — Patient Instructions (Addendum)
Medication Instructions:  Your physician has recommended you make the following change in your medication:  1. STOP Sotalol 2. START Diltiazem 240 mg once a day  *If you need a refill on your cardiac medications before your next appointment, please call your pharmacy*   Lab Work: None ordered If you have labs (blood work) drawn today and your tests are completely normal, you will receive your results only by: Marland Kitchen MyChart Message (if you have MyChart) OR . A paper copy in the mail If you have any lab test that is abnormal or we need to change your treatment, we will call you to review the results.   Testing/Procedures: None ordered   Follow-Up: At [facility], you and your health needs are our priority.  As part of our continuing mission to provide you with exceptional heart care, we have created designated Provider Care Teams.  These Care Teams include your primary Cardiologist (physician) and Advanced Practice Providers (APPs -  Physician Assistants and Nurse Practitioners) who all work together to provide you with the care you need, when you need it.  We recommend signing up for the patient portal called "MyChart".  Sign up information is provided on this After Visit Summary.  MyChart is used to connect with patients for Virtual Visits (Telemedicine).  Patients are able to view lab/test results, encounter notes, upcoming appointments, etc.  Non-urgent messages can be sent to your provider as well.   To learn more about what you can do with MyChart, go to [email or web address].    Your next appointment:   to be determined after Tikosyn admission  The format for your next appointment:   In Person  Provider:   Loman Brooklyn, MD   Thank you for choosing [facility]!!   Dory Horn, RN [phone]    Other Instructions  The AFib clinic will call you to arrange Tikosyn admission.   Preparing for Tikosyn (Dofetilide) Admission  1) Check with insurance company for  cost of drug to ensure affordability.  Dofetilide 500 mcg twice a day  2) Tikosyn admission requires a 3 day hospital stay with constant telemetry monitoring. You will have and EKG after each dose of Tikosyn.  If the drug does not convert you to normal rhythm a cardioversion will be performed prior to discharge.  3) A pharmacist will review all of your medications.  If there are any interactions noted we will be in touch with you.  4) Please ensure no missed doses of your anticoagulation (blood thinner) for the past 3 weeks prior to admission.  5) No Benadryl is allowed 3 days prior to admission.  6) On day of admission - you will come in for office visit in the Afib Clinic where lab work will be drawn.  IF the labs are acceptable an inpatient bed will be requested.  7) Normally admission to a bed is straight from our office.  This is all dependent on bed availability.  In some instances you may be sent home and bed placement will call later in the day when a bed is available.  8) You may bring personal belongings/clothing with you to the hospital.  Please leave your suitcase in the care until you arrive in admissions.  Questions - please call the office at [phone]     Dofetilide capsules What is this medicine? DOFETILIDE (doe FET il ide) is an antiarrhythmic drug. It helps make your heart beat regularly. This medicine also helps to slow rapid heartbeats. This medicine may  be used for other purposes; ask your health care provider or pharmacist if you have questions. COMMON BRAND NAME(S): Tikosyn What should I tell my health care provider before I take this medicine? They need to know if you have any of these conditions:  heart disease  history of irregular heartbeat  history of low levels of potassium or magnesium in the blood  kidney disease  liver disease  an unusual or allergic reaction to dofetilide, other medicines, foods, dyes, or preservatives  pregnant or trying  to get pregnant  breast-feeding How should I use this medicine? Take this medicine by mouth with a glass of water. Follow the directions on the prescription label. Do not take with grapefruit juice. You can take it with or without food. If it upsets your stomach, take it with food. Take your medicine at regular intervals. Do not take it more often than directed. Do not stop taking except on your doctor's advice. A special MedGuide will be given to you by the pharmacist with each prescription and refill. Be sure to read this information carefully each time. Talk to your pediatrician regarding the use of this medicine in children. Special care may be needed. Overdosage: If you think you have taken too much of this medicine contact a poison control center or emergency room at once. NOTE: This medicine is only for you. Do not share this medicine with others. What if I miss a dose? If you miss a dose, skip it. Take your next dose at the normal time. Do not take extra or 2 doses at the same time to make up for the missed dose. What may interact with this medicine? Do not take this medicine with any of the following medications:  cimetidine  cisapride  dolutegravir  dronedarone  hydrochlorothiazide  ketoconazole  megestrol  pimozide  prochlorperazine  thioridazine  trimethoprim  verapamil This medicine may also interact with the following medications:  amiloride  cannabinoids  certain antibiotics like erythromycin or clarithromycin  certain antiviral medicines for HIV or hepatitis  certain medicines for depression, anxiety, or psychotic disorders  digoxin  diltiazem  grapefruit juice  metformin  nefazodone  other medicines that prolong the QT interval (an abnormal heart rhythm)  quinine  triamterene  zafirlukast  ziprasidone This list may not describe all possible interactions. Give your health care provider a list of all the medicines, herbs,  non-prescription drugs, or dietary supplements you use. Also tell them if you smoke, drink alcohol, or use illegal drugs. Some items may interact with your medicine. What should I watch for while using this medicine? Your condition will be monitored carefully while you are receiving this medicine. What side effects may I notice from receiving this medicine? Side effects that you should report to your doctor or health care professional as soon as possible:  allergic reactions like skin rash, itching or hives, swelling of the face, lips, or tongue  breathing problems  chest pain or chest tightness  dizziness  signs and symptoms of a dangerous change in heartbeat or heart rhythm like chest pain; dizziness; fast or irregular heartbeat; palpitations; feeling faint or lightheaded, falls; breathing problems  signs and symptoms of electrolyte imbalance like severe diarrhea, unusual sweating, vomiting, loss of appetite, increased thirst  swelling of the ankles, legs, or feet  tingling, numbness in the hands or feet Side effects that usually do not require medical attention (report to your doctor or health care professional if they continue or are bothersome):  diarrhea  general ill feeling or flu-like symptoms  headache  nausea  trouble sleeping  stomach pain This list may not describe all possible side effects. Call your doctor for medical advice about side effects. You may report side effects to FDA at 1-800-FDA-1088. Where should I keep my medicine? Keep out of the reach of children. Store at room temperature between 15 and 30 degrees C (59 and 86 degrees F). Throw away any unused medicine after the expiration date. NOTE: This sheet is a summary. It may not cover all possible information. If you have questions about this medicine, talk to your doctor, pharmacist, or health care provider.  2020 Elsevier/Gold Standard (2018-11-25 10:18:48)

## 2020-04-01 NOTE — Addendum Note (Signed)
Addended by: Baird Lyons on: 04/01/2020 01:38 PM   Modules accepted: Orders

## 2020-04-01 NOTE — Telephone Encounter (Addendum)
Medication list reviewed in anticipation of upcoming Tikosyn initiation. Patient is not taking any contraindicated or QTc prolonging medications. Sotalol should be stopped at least 3 days prior to initiation (was stopped today).  Patient is anticoagulated on Xarelto on the appropriate dose. Please ensure that patient has not missed any anticoagulation doses in the 3 weeks prior to Tikosyn initiation.   Patient will need to be counseled to avoid use of Benadryl while on Tikosyn and in the 2-3 days prior to Tikosyn initiation.  Please ensure K is at least 4 and Mg is at least 2

## 2020-04-01 NOTE — Telephone Encounter (Signed)
This encounter was created in error - please disregard.

## 2020-04-02 ENCOUNTER — Other Ambulatory Visit (HOSPITAL_COMMUNITY): Payer: Self-pay | Admitting: *Deleted

## 2020-04-13 ENCOUNTER — Other Ambulatory Visit (HOSPITAL_COMMUNITY): Payer: Self-pay | Admitting: *Deleted

## 2020-04-13 MED ORDER — DILTIAZEM HCL ER COATED BEADS 120 MG PO CP24: 120.0000 mg | ORAL_CAPSULE | Freq: Every day | ORAL | Status: DC

## 2020-04-15 ENCOUNTER — Other Ambulatory Visit (HOSPITAL_COMMUNITY)
Admission: RE | Admit: 2020-04-15 | Discharge: 2020-04-15 | Disposition: A | Discharge status: Home / Self Care | Payer: Medicare PPO | Source: Ambulatory Visit | Attending: Physician Assistant | Admitting: Physician Assistant

## 2020-04-15 DIAGNOSIS — Z01812 Encounter for preprocedural laboratory examination: Secondary | ICD-10-CM | POA: Diagnosis present

## 2020-04-15 DIAGNOSIS — Z20822 Contact with and (suspected) exposure to covid-19: Secondary | ICD-10-CM | POA: Insufficient documentation

## 2020-04-15 LAB — SARS CORONAVIRUS 2 (TAT 6-24 HRS): SARS Coronavirus 2: NEGATIVE

## 2020-04-19 ENCOUNTER — Other Ambulatory Visit: Payer: Self-pay

## 2020-04-19 ENCOUNTER — Encounter (HOSPITAL_COMMUNITY): Payer: Self-pay | Admitting: Physician Assistant

## 2020-04-19 ENCOUNTER — Ambulatory Visit (HOSPITAL_COMMUNITY)
Admission: RE | Admit: 2020-04-19 | Discharge: 2020-04-19 | Disposition: A | Discharge status: Home / Self Care | Payer: Medicare PPO | Source: Ambulatory Visit | Attending: Physician Assistant | Admitting: Physician Assistant

## 2020-04-19 ENCOUNTER — Inpatient Hospital Stay (HOSPITAL_COMMUNITY)
Admission: AD | Admit: 2020-04-19 | Discharge: 2020-04-22 | DRG: 310 | Disposition: A | Discharge status: Home / Self Care | Payer: Medicare PPO | Source: Ambulatory Visit | Attending: Cardiology | Admitting: Cardiology

## 2020-04-19 VITALS — BP 142/60 | HR 126 | Ht 69.0 in | Wt 176.2 lb

## 2020-04-19 DIAGNOSIS — I4892 Unspecified atrial flutter: Secondary | ICD-10-CM | POA: Diagnosis present

## 2020-04-19 DIAGNOSIS — J309 Allergic rhinitis, unspecified: Secondary | ICD-10-CM | POA: Diagnosis present

## 2020-04-19 DIAGNOSIS — Z880 Allergy status to penicillin: Secondary | ICD-10-CM | POA: Diagnosis not present

## 2020-04-19 DIAGNOSIS — Z91040 Latex allergy status: Secondary | ICD-10-CM

## 2020-04-19 DIAGNOSIS — E039 Hypothyroidism, unspecified: Secondary | ICD-10-CM | POA: Diagnosis present

## 2020-04-19 DIAGNOSIS — Z7901 Long term (current) use of anticoagulants: Secondary | ICD-10-CM

## 2020-04-19 DIAGNOSIS — Z825 Family history of asthma and other chronic lower respiratory diseases: Secondary | ICD-10-CM | POA: Diagnosis not present

## 2020-04-19 DIAGNOSIS — E785 Hyperlipidemia, unspecified: Secondary | ICD-10-CM | POA: Diagnosis present

## 2020-04-19 DIAGNOSIS — Z882 Allergy status to sulfonamides status: Secondary | ICD-10-CM | POA: Diagnosis not present

## 2020-04-19 DIAGNOSIS — Z79890 Hormone replacement therapy (postmenopausal): Secondary | ICD-10-CM | POA: Diagnosis not present

## 2020-04-19 DIAGNOSIS — I4819 Other persistent atrial fibrillation: Secondary | ICD-10-CM | POA: Insufficient documentation

## 2020-04-19 DIAGNOSIS — Z79899 Other long term (current) drug therapy: Secondary | ICD-10-CM | POA: Diagnosis not present

## 2020-04-19 LAB — BASIC METABOLIC PANEL
Anion gap: 10 (ref 5–15)
BUN: 25 mg/dL — ABNORMAL HIGH (ref 8–23)
CO2: 25 mmol/L (ref 22–32)
Calcium: 9.7 mg/dL (ref 8.9–10.3)
Chloride: 109 mmol/L (ref 98–111)
Creatinine, Ser: 1.35 mg/dL — ABNORMAL HIGH (ref 0.44–1.00)
GFR calc Af Amer: 45 mL/min — ABNORMAL LOW (ref >60)
GFR calc non Af Amer: 39 mL/min — ABNORMAL LOW (ref >60)
Glucose, Bld: 105 mg/dL — ABNORMAL HIGH (ref 70–99)
Potassium: 3.9 mmol/L (ref 3.5–5.1)
Sodium: 144 mmol/L (ref 135–145)

## 2020-04-19 LAB — MAGNESIUM: Magnesium: 1.9 mg/dL (ref 1.7–2.4)

## 2020-04-19 MED ORDER — SODIUM CHLORIDE 0.9% FLUSH: 3.0000 mL | INTRAVENOUS | Status: DC | PRN

## 2020-04-19 MED ORDER — POTASSIUM CHLORIDE CRYS ER 20 MEQ PO TBCR
40.0000 meq | EXTENDED_RELEASE_TABLET | Freq: Once | ORAL | Status: AC
  Administered 2020-04-19: 40 meq via ORAL
  Filled 2020-04-19: qty 2

## 2020-04-19 MED ORDER — MAGNESIUM SULFATE 2 GM/50ML IV SOLN
2.0000 g | Freq: Once | INTRAVENOUS | Status: AC
  Administered 2020-04-19: 2 g via INTRAVENOUS
  Filled 2020-04-19: qty 50

## 2020-04-19 MED ORDER — DILTIAZEM HCL ER COATED BEADS 240 MG PO CP24
240.0000 mg | ORAL_CAPSULE | Freq: Every day | ORAL | Status: DC
  Administered 2020-04-19 – 2020-04-20 (×2): 240 mg via ORAL
  Filled 2020-04-19 (×2): qty 1

## 2020-04-19 MED ORDER — AZELASTINE HCL 0.1 % NA SOLN
2.0000 | Freq: Two times a day (BID) | NASAL | Status: DC
  Administered 2020-04-19 – 2020-04-21 (×2): 2 via NASAL
  Filled 2020-04-19: qty 30

## 2020-04-19 MED ORDER — SODIUM CHLORIDE 0.9% FLUSH
3.0000 mL | Freq: Two times a day (BID) | INTRAVENOUS | Status: DC
  Administered 2020-04-20 – 2020-04-22 (×3): 3 mL via INTRAVENOUS

## 2020-04-19 MED ORDER — FENOFIBRATE 160 MG PO TABS
160.0000 mg | ORAL_TABLET | ORAL | Status: DC
  Administered 2020-04-20 – 2020-04-22 (×2): 160 mg via ORAL
  Filled 2020-04-19 (×3): qty 1

## 2020-04-19 MED ORDER — SODIUM CHLORIDE 0.9 % IV SOLN: 250.0000 mL | INTRAVENOUS | Status: DC | PRN

## 2020-04-19 MED ORDER — DOFETILIDE 250 MCG PO CAPS
250.0000 ug | ORAL_CAPSULE | Freq: Two times a day (BID) | ORAL | Status: DC
  Administered 2020-04-19 – 2020-04-21 (×4): 250 ug via ORAL
  Filled 2020-04-19 (×4): qty 1

## 2020-04-19 MED ORDER — VITAMIN D 25 MCG (1000 UNIT) PO TABS
2000.0000 [IU] | ORAL_TABLET | Freq: Every day | ORAL | Status: DC
  Administered 2020-04-20 – 2020-04-22 (×3): 2000 [IU] via ORAL
  Filled 2020-04-19 (×4): qty 2

## 2020-04-19 MED ORDER — LORATADINE 10 MG PO TABS
10.0000 mg | ORAL_TABLET | Freq: Every day | ORAL | Status: DC
  Administered 2020-04-20: 10 mg via ORAL
  Filled 2020-04-19 (×3): qty 1

## 2020-04-19 MED ORDER — PANTOPRAZOLE SODIUM 40 MG PO TBEC
40.0000 mg | DELAYED_RELEASE_TABLET | Freq: Every day | ORAL | Status: DC
  Administered 2020-04-19 – 2020-04-21 (×3): 40 mg via ORAL
  Filled 2020-04-19 (×3): qty 1

## 2020-04-19 MED ORDER — RIVAROXABAN 20 MG PO TABS
20.0000 mg | ORAL_TABLET | Freq: Every day | ORAL | Status: DC
  Administered 2020-04-19 – 2020-04-21 (×3): 20 mg via ORAL
  Filled 2020-04-19 (×3): qty 1

## 2020-04-19 MED ORDER — ATORVASTATIN CALCIUM 10 MG PO TABS
10.0000 mg | ORAL_TABLET | Freq: Every day | ORAL | Status: DC
  Administered 2020-04-19 – 2020-04-21 (×3): 10 mg via ORAL
  Filled 2020-04-19 (×3): qty 1

## 2020-04-19 MED ORDER — LEVOTHYROXINE SODIUM 75 MCG PO TABS
75.0000 ug | ORAL_TABLET | Freq: Every day | ORAL | Status: DC
  Administered 2020-04-20 – 2020-04-22 (×3): 75 ug via ORAL
  Filled 2020-04-19 (×3): qty 1

## 2020-04-19 NOTE — Progress Notes (Signed)
Pharmacy: Dofetilide (Tikosyn) - Initial Consult Assessment and Electrolyte Replacement  Pharmacy consulted to assist in monitoring and replacing electrolytes in this 74 y.o. female admitted on 04/19/2020 undergoing dofetilide initiation. First dofetilide dose: 5/3@2000   Assessment:  Patient Exclusion Criteria: If any screening criteria checked as "Yes", then  patient  should NOT receive dofetilide until criteria item is corrected.  If "Yes" please indicate correction plan.  YES  NO Patient  Exclusion Criteria Correction Plan   [x]   []   Baseline QTc interval is greater than or equal to 440 msec. IF above YES box checked dofetilide contraindicated unless patient has ICD; then may proceed if QTc 500-550 msec or with known ventricular conduction abnormalities may proceed with QTc 550-600 msec. QTc 463 - ok per EP    []   [x]   Patient is known or suspected to have a digoxin level greater than 2 ng/ml: No results found for: DIGOXIN     []   [x]   Creatinine clearance less than 20 ml/min (calculated using Cockcroft-Gault, actual body weight and serum creatinine): Estimated Creatinine Clearance: 38.8 mL/min (A) (by C-G formula based on SCr of 1.35 mg/dL (H)).     []   [x]  Patient has received drugs known to prolong the QT intervals within the last 48 hours (phenothiazines, tricyclics or tetracyclic antidepressants, erythromycin, H-1 antihistamines, cisapride, fluoroquinolones, azithromycin). Updated information on QT prolonging agents is available to be searched on the following database:QT prolonging agents     []   [x]   Patient received a dose of hydrochlorothiazide (Oretic) alone or in any combination including triamterene (Dyazide, Maxzide) in the last 48 hours.    []   [x]  Patient received a medication known to increase dofetilide plasma concentrations prior to initial dofetilide dose:  . Trimethoprim (Primsol, Proloprim) in the last 36 hours . Verapamil (Calan, Verelan) in the last 36  hours or a sustained release dose in the last 72 hours . Megestrol (Megace) in the last 5 days  . Cimetidine (Tagamet) in the last 6 hours . Ketoconazole (Nizoral) in the last 24 hours . Itraconazole (Sporanox) in the last 48 hours  . Prochlorperazine (Compazine) in the last 36 hours     []   [x]   Patient is known to have a history of torsades de pointes; congenital or acquired long QT syndromes.    []   [x]   Patient has received a Class 1 antiarrhythmic with less than 2 half-lives since last dose. (Disopyramide, Quinidine, Procainamide, Lidocaine, Mexiletine, Flecainide, Propafenone)    []   [x]   Patient has received amiodarone therapy in the past 3 months or amiodarone level is greater than 0.3 ng/ml.    Patient has been appropriately anticoagulated with Xarelto.  Labs:    Component Value Date/Time   K 3.9 04/19/2020 1057   MG 1.9 04/19/2020 1057     Plan: Potassium: K 3.8-3.9:  Hold Tikosyn initiation and give KCl 40 mEq po x1 then begin Tikosyn at least 2hr after KCl dose - do not need to recheck K   Magnesium: Mg 1.8-2: Give Mg 2 gm IV x1 to prevent Mg from dropping below 1.8 - do not need to recheck Mg. Appropriate to initiate Tikosyn   Thank you for allowing pharmacy to participate in this patient's care   , PharmD, BCCCP Clinical Pharmacist  Phone: [phone] 04/19/2020 1:31 PM  Please check AMION for all [facility] phone numbers After 10:00 PM, call Main Pharmacy [phone]

## 2020-04-19 NOTE — Progress Notes (Addendum)
Repeat EKG is SR 64bpm, measured QT 400-470ms, Qtc 413-427ms Pharmacy has addressed electrolytes  Planned for Tikosyn initiation  Francis Dowse, PA-C  EP Attending  Agree.   Leonia Reeves.D.

## 2020-04-19 NOTE — H&P (Signed)
EP Attending    Primary Care Physician: Gillian Scarce, MD  Primary Cardiologist: Dr Rudolpho Sevin  Primary Electrophysiologist: Dr Elberta Fortis  Referring Physician: Dr Dorena Cookey is a 74 y.o. female with a history of persistent atrial fibrillation, atrial flutter, HLD, hypothyroidism who presents for follow up in the [facility]. Patient is on Xarelto for a CHADS2VASC score of 2. She had an afib ablation in 2016 and an afib/flutter ablation by Dr Rudolpho Sevin on 12/29/19. Patient continued to have paroxysms of afib and presented to Dr Elberta Fortis for a second opinion on 04/01/20. Dofetilide was recommended and she presents today for admission. She is out of rhythm today with symptoms of fatigue, weakness, and SOB.  Today, she denies symptoms of palpitations, chest pain, orthopnea, PND, lower extremity edema, dizziness, presyncope, syncope, snoring, daytime somnolence, bleeding, or neurologic sequela. The patient is tolerating medications without difficulties and is otherwise without complaint today.  Atrial Fibrillation Risk Factors:  she does not have symptoms or diagnosis of sleep apnea.  she does not have a history of rheumatic fever.  she does not have a history of alcohol use.  she has a BMI of Body mass index is 26.02 kg/m.Marland Kitchen     Filed Weights   04/19/20 1104  Weight: 79.9 kg        Family History  Problem Relation Age of Onset  . Asthma Mother   . Asthma Sister    Atrial Fibrillation Management history:  Previous antiarrhythmic drugs: flecainide, sotalol  Previous cardioversions: none  Previous ablations: 2016, 12/29/19  CHADS2VASC score: 2  Anticoagulation history: Xarelto      Past Medical History:  Diagnosis Date  . Allergic rhinitis         Past Surgical History:  Procedure Laterality Date  . ADENOIDECTOMY    . APPENDECTOMY    . HERNIA REPAIR    . pulmonary vein abrasion    . SHOULDER ARTHROSCOPY    . TONSILLECTOMY           Current  Outpatient Medications  Medication Sig Dispense Refill  . atorvastatin (LIPITOR) 20 MG tablet Take 10 mg by mouth daily.     Marland Kitchen azelastine (ASTELIN) 0.1 % nasal spray Place 2 sprays into both nostrils 2 (two) times daily. 30 mL 5  . Cholecalciferol (VITAMIN D3) 2000 units capsule Take by mouth.    . diltiazem (TIAZAC) 240 MG 24 hr capsule     . fenofibrate (TRICOR) 145 MG tablet Take 145 mg by mouth every other day.     . fexofenadine (ALLEGRA) 180 MG tablet Take by mouth as needed.     Marland Kitchen levothyroxine (SYNTHROID, LEVOTHROID) 75 MCG tablet Take 75 mcg by mouth daily before breakfast.     . pantoprazole (PROTONIX) 40 MG injection     . rivaroxaban (XARELTO) 20 MG TABS tablet Take 20 mg by mouth daily with supper.     No current facility-administered medications for this encounter.        Allergies  Allergen Reactions  . Sulfa Antibiotics Rash    rash  . Augmentin [Amoxicillin-Pot Clavulanate] Other (See Comments)    Stomach issues  . Trazodone     nightmares  . Latex Rash    Other reaction(s): RASH    Social History        Socioeconomic History  . Marital status: Married    Spouse name: Not on file  . Number of children: Not on file  . Years of education:  Not on file  . Highest education level: Not on file  Occupational History  . Not on file  Tobacco Use  . Smoking status: Never Smoker  . Smokeless tobacco: Never Used  Substance and Sexual Activity  . Alcohol use: Yes    Alcohol/week: 2.0 standard drinks    Types: 1 Glasses of wine, 1 Cans of beer per week  . Drug use: No  . Sexual activity: Not on file  Other Topics Concern  . Not on file  Social History Narrative  . Not on file   Social Determinants of Health      Financial Resource Strain:   . Difficulty of Paying Living Expenses:   Food Insecurity:   . Worried About Charity fundraiser in the Last Year:   . Arboriculturist in the Last Year:   Transportation Needs:   . Film/video editor (Medical):    Marland Kitchen Lack of Transportation (Non-Medical):   Physical Activity:   . Days of Exercise per Week:   . Minutes of Exercise per Session:   Stress:   . Feeling of Stress :   Social Connections:   . Frequency of Communication with Friends and Family:   . Frequency of Social Gatherings with Friends and Family:   . Attends Religious Services:   . Active Member of Clubs or Organizations:   . Attends Archivist Meetings:   Marland Kitchen Marital Status:   Intimate Partner Violence:   . Fear of Current or Ex-Partner:   . Emotionally Abused:   Marland Kitchen Physically Abused:   . Sexually Abused:    ROS- All systems are reviewed and negative except as per the HPI above.  Physical Exam:     Vitals:   04/19/20 1104  BP: (!) 142/60  Pulse: (!) 126  Weight: 79.9 kg  Height: 5\' 9"  (1.753 m)   GEN- The patient is well appearing, alert and oriented x 3 today.  Head- normocephalic, atraumatic  Eyes- Sclera clear, conjunctiva pink  Ears- hearing intact  Oropharynx- clear  Neck- supple  Lungs- Clear to ausculation bilaterally, normal work of breathing  Heart- irregular rate and rhythm, tachycardia, no murmurs, rubs or gallops  GI- soft, NT, ND, + BS  Extremities- no clubbing, cyanosis, or edema  MS- no significant deformity or atrophy  Skin- no rash or lesion  Psych- euthymic mood, full affect  Neuro- strength and sensation are intact     Wt Readings from Last 3 Encounters:  04/19/20 79.9 kg  04/01/20 81.6 kg  01/29/19 77.9 kg   EKG today demonstrates atypical atrial flutter HR 126, LAFB, QRS 84, QTc 463  TEE 01/07/20 demonstrated  Review of the above records today demonstrates:  The left ventricular size is normal. There is normal left ventricular wall thickness. LV ejection fraction = 60-65%. No segmental wall motion abnormalities seen in the left ventricle The left atrium is moderately dilated. A left-to-right shunt was visualized by color flow Doppler.  Epic records are reviewed at length today   CHA2DS2-VASc Score = 2  The patient's score is based upon:  CHF History: 0  HTN History: 0  Age : 1  Diabetes History: 0  Stroke History: 0  Vascular Disease History: 0  Gender: 1   ASSESSMENT AND PLAN:  1. Persistent Atrial Fibrillation/atrial flutter  The patient's CHA2DS2-VASc score is 2, indicating a 2.2% annual risk of stroke.  S/p afib ablation x2 and atrial flutter ablation.  She is in afib today.  Patient wants to pursue dofetilide, aware of risk vrs benefit  Aware of price of dofetilide.  Patient will continue on Xarelto 20 mg daily, states no missed doses. Will need to monitor CrCl closely.  No benadryl use  PharmD has screened drugs and no QT prolonging drugs on board.  Sotalol stopped 04/01/20  Labs today show creatinine at 1.35, K+ 3.9 and mag 1.9, CrCl calculated at 47 mL/min  To be admitted later today once a bed becomes available.  Jorja Loa PA-C  Afib Clinic  [facility]  [address]  [place], [place] [address]  [phone]  04/19/2020  11:38 AM  EP Attending  Patient seen and examined. Agree with the findings as noted above. The patient presents today for initiation of dofetilide. Her QT is acceptable. She will be admitted to start dofetilide. We will follow her QT carefully.   Leonia Reeves.D.

## 2020-04-19 NOTE — TOC Benefit Eligibility Note (Signed)
Transition of Care [facility]) Benefit Eligibility Note    Patient Details  Name: Rebecca Carter MRN: [id number] Date of Birth: [birth year]   Medication/Dose: DOFETILIDE  CAPSULE  125 MCG    CO-PAY- ZERO DOLLARS  , 250 MCG CO-PAY-ZERO DOLLARS  , 500 MCG  CO-PAY-M ZERO DOLLARS  BID  Covered?: Yes  Tier: (TIER- 1 DRUG)  Prescription Coverage Preferred Pharmacy: [facility] with Person/Company/Phone Number:: ASHLEY   @ [employer]  #  [phone]  Co-Pay: Alvester Morin  Prior Approval: No  Deductible: Unmet(OUT-OF-POCKJET-UNMET)  Additional Notes: TIKOSYN  CAPSULE 125 MCG ,  250 MCG , 500 MCG BID : NOT CIVER, P/A -YES # [phone]    Mardene Sayer Phone Number: 04/19/2020, 3:46 PM

## 2020-04-19 NOTE — Progress Notes (Addendum)
Primary Care Physician: Jonathon Resides, MD Primary Cardiologist: Dr Minna Merritts Primary Electrophysiologist: Dr Curt Bears Referring Physician: Dr Kimberlee Nearing Bosko is a 74 y.o. female with a history of persistent atrial fibrillation, atrial flutter, HLD, hypothyroidism who presents for follow up in the [facility]. Patient is on Xarelto for a CHADS2VASC score of 2. She had an afib ablation in 2016 and an afib/flutter ablation by Dr Minna Merritts on 12/29/19. Patient continued to have paroxysms of afib and presented to Dr Curt Bears for a second opinion on 04/01/20. Dofetilide was recommended and she presents today for admission. She is out of rhythm today with symptoms of fatigue, weakness, and SOB.   Today, she denies symptoms of palpitations, chest pain, orthopnea, PND, lower extremity edema, dizziness, presyncope, syncope, snoring, daytime somnolence, bleeding, or neurologic sequela. The patient is tolerating medications without difficulties and is otherwise without complaint today.    Atrial Fibrillation Risk Factors:  she does not have symptoms or diagnosis of sleep apnea. she does not have a history of rheumatic fever. she does not have a history of alcohol use.   she has a BMI of Body mass index is 26.02 kg/m.Marland Kitchen Filed Weights   04/19/20 1104  Weight: 79.9 kg    Family History  Problem Relation Age of Onset  . Asthma Mother   . Asthma Sister      Atrial Fibrillation Management history:  Previous antiarrhythmic drugs: flecainide, sotalol Previous cardioversions: none Previous ablations: 2016, 12/29/19 CHADS2VASC score: 2 Anticoagulation history: Xarelto    Past Medical History:  Diagnosis Date  . Allergic rhinitis    Past Surgical History:  Procedure Laterality Date  . ADENOIDECTOMY    . APPENDECTOMY    . HERNIA REPAIR    . pulmonary vein abrasion    . SHOULDER ARTHROSCOPY    . TONSILLECTOMY      Current Outpatient Medications    Medication Sig Dispense Refill  . atorvastatin (LIPITOR) 20 MG tablet Take 10 mg by mouth daily.     Marland Kitchen azelastine (ASTELIN) 0.1 % nasal spray Place 2 sprays into both nostrils 2 (two) times daily. 30 mL 5  . Cholecalciferol (VITAMIN D3) 2000 units capsule Take by mouth.    . diltiazem (TIAZAC) 240 MG 24 hr capsule     . fenofibrate (TRICOR) 145 MG tablet Take 145 mg by mouth every other day.     . fexofenadine (ALLEGRA) 180 MG tablet Take by mouth as needed.     Marland Kitchen levothyroxine (SYNTHROID, LEVOTHROID) 75 MCG tablet Take 75 mcg by mouth daily before breakfast.     . pantoprazole (PROTONIX) 40 MG injection     . rivaroxaban (XARELTO) 20 MG TABS tablet Take 20 mg by mouth daily with supper.     No current facility-administered medications for this encounter.    Allergies  Allergen Reactions  . Sulfa Antibiotics Rash    rash  . Augmentin [Amoxicillin-Pot Clavulanate] Other (See Comments)    Stomach issues  . Trazodone     nightmares  . Latex Rash    Other reaction(s): RASH     Social History   Socioeconomic History  . Marital status: Married    Spouse name: Not on file  . Number of children: Not on file  . Years of education: Not on file  . Highest education level: Not on file  Occupational History  . Not on file  Tobacco Use  . Smoking status: Never Smoker  . Smokeless tobacco:  Never Used  Substance and Sexual Activity  . Alcohol use: Yes    Alcohol/week: 2.0 standard drinks    Types: 1 Glasses of wine, 1 Cans of beer per week  . Drug use: No  . Sexual activity: Not on file  Other Topics Concern  . Not on file  Social History Narrative  . Not on file   Social Determinants of Health   Financial Resource Strain:   . Difficulty of Paying Living Expenses:   Food Insecurity:   . Worried About Programme researcher, broadcasting/film/video in the Last Year:   . Barista in the Last Year:   Transportation Needs:   . Freight forwarder (Medical):   Marland Kitchen Lack of Transportation  (Non-Medical):   Physical Activity:   . Days of Exercise per Week:   . Minutes of Exercise per Session:   Stress:   . Feeling of Stress :   Social Connections:   . Frequency of Communication with Friends and Family:   . Frequency of Social Gatherings with Friends and Family:   . Attends Religious Services:   . Active Member of Clubs or Organizations:   . Attends Banker Meetings:   Marland Kitchen Marital Status:   Intimate Partner Violence:   . Fear of Current or Ex-Partner:   . Emotionally Abused:   Marland Kitchen Physically Abused:   . Sexually Abused:      ROS- All systems are reviewed and negative except as per the HPI above.  Physical Exam: Vitals:   04/19/20 1104  BP: (!) 142/60  Pulse: (!) 126  Weight: 79.9 kg  Height: 5\' 9"  (1.753 m)    GEN- The patient is well appearing, alert and oriented x 3 today.   Head- normocephalic, atraumatic Eyes-  Sclera clear, conjunctiva pink Ears- hearing intact Oropharynx- clear Neck- supple  Lungs- Clear to ausculation bilaterally, normal work of breathing Heart- irregular rate and rhythm, tachycardia, no murmurs, rubs or gallops  GI- soft, NT, ND, + BS Extremities- no clubbing, cyanosis, or edema MS- no significant deformity or atrophy Skin- no rash or lesion Psych- euthymic mood, full affect Neuro- strength and sensation are intact  Wt Readings from Last 3 Encounters:  04/19/20 79.9 kg  04/01/20 81.6 kg  01/29/19 77.9 kg    EKG today demonstrates atypical atrial flutter HR 126, LAFB, QRS 84, QTc 463  TEE 01/07/20 demonstrated  Review of the above records today demonstrates:  The left ventricular size is normal. There is normal left ventricular wall thickness. LV ejection fraction = 60-65%. No segmental wall motion abnormalities seen in the left ventricle The left atrium is moderately dilated. A left-to-right shunt was visualized by color flow Doppler.  Epic records are reviewed at length today  CHA2DS2-VASc Score = 2  The  patient's score is based upon: CHF History: 0 HTN History: 0 Age : 1 Diabetes History: 0 Stroke History: 0 Vascular Disease History: 0 Gender: 1      ASSESSMENT AND PLAN: 1. Persistent Atrial Fibrillation/atrial flutter The patient's CHA2DS2-VASc score is 2, indicating a 2.2% annual risk of stroke.   S/p afib ablation x2 and atrial flutter ablation.  She is in afib today.  Patient wants to pursue dofetilide, aware of risk vrs benefit Aware of price of dofetilide. Patient will continue on Xarelto 20 mg daily, states no missed doses. Will need to monitor CrCl closely. No benadryl use PharmD has screened drugs and no QT prolonging drugs on board. Sotalol stopped 04/01/20 Labs  today show creatinine at 1.35, K+ 3.9 and mag 1.9, CrCl calculated at 47 mL/min   To be admitted later today once a bed becomes available.    Jorja Loa PA-C Afib Clinic [facility] [address] [place], [place] [address] [phone] 04/19/2020 11:38 AM

## 2020-04-19 NOTE — Addendum Note (Signed)
Encounter addended by: Danice Goltz, PA on: 04/19/2020 12:05 PM  Actions taken: Clinical Note Signed

## 2020-04-20 DIAGNOSIS — I4819 Other persistent atrial fibrillation: Secondary | ICD-10-CM | POA: Diagnosis not present

## 2020-04-20 LAB — BASIC METABOLIC PANEL
Anion gap: 5 (ref 5–15)
BUN: 22 mg/dL (ref 8–23)
CO2: 25 mmol/L (ref 22–32)
Calcium: 8.9 mg/dL (ref 8.9–10.3)
Chloride: 110 mmol/L (ref 98–111)
Creatinine, Ser: 1.08 mg/dL — ABNORMAL HIGH (ref 0.44–1.00)
GFR calc Af Amer: 59 mL/min — ABNORMAL LOW (ref >60)
GFR calc non Af Amer: 51 mL/min — ABNORMAL LOW (ref >60)
Glucose, Bld: 104 mg/dL — ABNORMAL HIGH (ref 70–99)
Potassium: 4 mmol/L (ref 3.5–5.1)
Sodium: 140 mmol/L (ref 135–145)

## 2020-04-20 LAB — MAGNESIUM: Magnesium: 2.1 mg/dL (ref 1.7–2.4)

## 2020-04-20 MED ORDER — LORAZEPAM 1 MG PO TABS
1.0000 mg | ORAL_TABLET | Freq: Two times a day (BID) | ORAL | Status: DC | PRN
  Administered 2020-04-20 – 2020-04-21 (×2): 1 mg via ORAL
  Filled 2020-04-20 (×2): qty 1

## 2020-04-20 NOTE — Care Management (Signed)
1418 04-20-20 Patient presented for Tikosyn Load! Patient is aware of cost and if cost is not zero dollars then the patient states she will use [facility] for medications. Pt will need a Rx for 30 day supply with refills sent to [facility] at [facility] [employer]. Patient also wants a Rx for Xarelto before she leaves- she has ran out. No further needs from Case Manager at this time. Graves-Bigelow, Lamar Laundry, RN, BSN Case Manager

## 2020-04-20 NOTE — Progress Notes (Addendum)
RN notified by CCMD of patient converting back into Afib. RN performed EKG and vital signs. Bhagat, PA paged. Will continue to monitor.   Bhagat, PA returned page: verbal order to monitor closely and administer scheduled tikosyn and cardizem at 2200.

## 2020-04-20 NOTE — Progress Notes (Addendum)
Progress Note  Patient Name: Rebecca Carter Date of Encounter: 04/20/2020  Primary EP: Dr. Elberta Fortis  Subjective   Slept terrible last night, otherwise no complaints  Inpatient Medications    Scheduled Meds:  atorvastatin  10 mg Oral q1800   azelastine  2 spray Each Nare BID   cholecalciferol  2,000 Units Oral Daily   diltiazem  240 mg Oral Daily   dofetilide  250 mcg Oral BID   fenofibrate  160 mg Oral QODAY   levothyroxine  75 mcg Oral Q0600   loratadine  10 mg Oral Daily   pantoprazole  40 mg Oral QHS   rivaroxaban  20 mg Oral Q supper   sodium chloride flush  3 mL Intravenous Q12H   Continuous Infusions:  sodium chloride     PRN Meds: sodium chloride, LORazepam, sodium chloride flush   Vital Signs    Vitals:   04/19/20 1404 04/19/20 2032 04/20/20 0033 04/20/20 0355  BP: 131/62 134/73 (!) 123/56 127/65  Pulse: 63 70 70 70  Resp: 17 18 17 16   Temp: 98.1 F (36.7 C) 98.2 F (36.8 C) 98.1 F (36.7 C) 98 F (36.7 C)  TempSrc: Oral Oral Oral Oral  SpO2: 98% 96% 97% 97%  Weight:      Height:        Intake/Output Summary (Last 24 hours) at 04/20/2020 0853 Last data filed at 04/19/2020 2000 Gross per 24 hour  Intake 290 ml  Output 500 ml  Net -210 ml   Last 3 Weights 04/19/2020 04/19/2020 04/01/2020  Weight (lbs) 175 lb 176 lb 3.2 oz 180 lb  Weight (kg) 79.379 kg 79.924 kg 81.647 kg      Telemetry    SR 72, QT 434, QTc 475 - Personally Reviewed  ECG    SR 60's - Personally Reviewed  Physical Exam   GEN: No acute distress.   Neck: No JVD Cardiac: RRR, no murmurs, rubs, or gallops.  Respiratory: CTA b/l. GI: Soft, nontender, non-distended  MS: No edema; No deformity. Neuro:  Nonfocal  Psych: Normal affect   Labs    High Sensitivity Troponin:  No results for input(s): TROPONINIHS in the last 720 hours.    Chemistry Recent Labs  Lab 04/19/20 1057 04/20/20 0701  NA 144 140  K 3.9 4.0  CL 109 110  CO2 25 25  GLUCOSE 105* 104*  BUN 25* 22    CREATININE 1.35* 1.08*  CALCIUM 9.7 8.9  GFRNONAA 39* 51*  GFRAA 45* 59*  ANIONGAP 10 5     HematologyNo results for input(s): WBC, RBC, HGB, HCT, MCV, MCH, MCHC, RDW, PLT in the last 168 hours.  BNPNo results for input(s): BNP, PROBNP in the last 168 hours.   DDimer No results for input(s): DDIMER in the last 168 hours.   Radiology    No results found.  Cardiac Studies     TEE 01/07/20 demonstrated  Review of the above records today demonstrates:  The left ventricular size is normal. There is normal left ventricular wall thickness. LV ejection fraction = 60-65%. No segmental wall motion abnormalities seen in the left ventricle The left atrium is moderately dilated. A left-to-right shunt was visualized by color flow Doppler.  Patient Profile     74 y.o. female with a history of persistent atrial fibrillation, atrial flutter, HLD, hypothyroidism.   She had an afib ablation in 2016 and an afib/flutter ablation by Dr 2017 on 12/29/19 Admitted for Tikosyn initiation   Sotalol failed, stopped  4/15 (prior to that flecainide)  Assessment & Plan    1. Persistent AFib/flutter     CHA2DS2Vasc is 2, on Xarelto     Tikosyn load is in progress      K+ 4.0      Mag 2.1      Creat 1.08 (better today, Cal CrCl 58), xarelto and Tikosyn appropriately dosed  2. Hypothyroidism     Home med   For questions or updates, please contact [place] Please consult [email or web address] for contact info under        Signed, Baldwin Jamaica, PA-C  04/20/2020, 8:53 AM    I have seen and examined this patient with Tommye Standard.  Agree with above, note added to reflect my findings.  On exam, RRR, no murmurs, lungs clear.  Patient admitted for dofetilide load.  Fortunately she remains in sinus rhythm.  QTC remained stable.  We Soham Hollett plan for continued 500 mcg dose.  Nelissa Bolduc M. Zita Ozimek MD 04/20/2020 9:04 AM

## 2020-04-20 NOTE — Progress Notes (Signed)
Pharmacy: Dofetilide (Tikosyn) - Follow Up Assessment and Electrolyte Replacement  Pharmacy consulted to assist in monitoring and replacing electrolytes in this 74 y.o. female admitted on 04/19/2020 undergoing dofetilide initiation. First dofetilide dose: 250 mcg bid, given 5/3 @ 2007  Labs:    Component Value Date/Time   K 4.0 04/20/2020 0701   MG 2.1 04/20/2020 0701     Plan: Potassium: K >/= 4: No additional supplementation needed  Magnesium: Mg > 2: No additional supplementation needed    Thank you for allowing pharmacy to participate in this patient's care   Jenetta Downer, [facility] Clinical Pharmacist Phone [phone]  04/20/2020 8:50 AM

## 2020-04-21 DIAGNOSIS — I4819 Other persistent atrial fibrillation: Secondary | ICD-10-CM | POA: Diagnosis not present

## 2020-04-21 LAB — BASIC METABOLIC PANEL
Anion gap: 11 (ref 5–15)
BUN: 21 mg/dL (ref 8–23)
CO2: 24 mmol/L (ref 22–32)
Calcium: 9.3 mg/dL (ref 8.9–10.3)
Chloride: 104 mmol/L (ref 98–111)
Creatinine, Ser: 1.04 mg/dL — ABNORMAL HIGH (ref 0.44–1.00)
GFR calc Af Amer: >60 mL/min (ref >60)
GFR calc non Af Amer: 53 mL/min — ABNORMAL LOW (ref >60)
Glucose, Bld: 109 mg/dL — ABNORMAL HIGH (ref 70–99)
Potassium: 4.1 mmol/L (ref 3.5–5.1)
Sodium: 139 mmol/L (ref 135–145)

## 2020-04-21 LAB — MAGNESIUM: Magnesium: 2 mg/dL (ref 1.7–2.4)

## 2020-04-21 MED ORDER — MAGNESIUM SULFATE 2 GM/50ML IV SOLN
2.0000 g | Freq: Once | INTRAVENOUS | Status: AC
  Administered 2020-04-21: 09:00:00 2 g via INTRAVENOUS
  Filled 2020-04-21: qty 50

## 2020-04-21 MED ORDER — DOFETILIDE 125 MCG PO CAPS
125.0000 ug | ORAL_CAPSULE | Freq: Two times a day (BID) | ORAL | Status: DC
  Administered 2020-04-21 – 2020-04-22 (×2): 125 ug via ORAL
  Filled 2020-04-21 (×3): qty 1

## 2020-04-21 MED ORDER — DILTIAZEM HCL ER COATED BEADS 180 MG PO CP24
300.0000 mg | ORAL_CAPSULE | Freq: Every day | ORAL | Status: DC
  Administered 2020-04-21: 300 mg via ORAL
  Filled 2020-04-21: qty 1

## 2020-04-21 NOTE — Progress Notes (Signed)
2 hr post tikosyn EKG shows prolonged QTc - 540. Rebecca Butte, PA paged. Pt stable, resting comfortably in bed.

## 2020-04-21 NOTE — Progress Notes (Addendum)
EKG reviewed with Dr. Elberta Fortis, QT has moved out, will reduce dose for tonight Telemetry reviewed  Francis Dowse, PA-C

## 2020-04-21 NOTE — Progress Notes (Signed)
Pharmacy: Dofetilide (Tikosyn) - Follow Up Assessment and Electrolyte Replacement  Pharmacy consulted to assist in monitoring and replacing electrolytes in this 74 y.o. female admitted on 04/19/2020 undergoing dofetilide initiation. First dofetilide dose: 250 mcg bid, given 5/3 @ 2007  Labs:    Component Value Date/Time   K 4.1 04/21/2020 0523   MG 2.0 04/21/2020 0523     Plan: Potassium: K >/= 4: No additional supplementation needed  Magnesium: Mg 1.8-2: Give Mg 2 gm IV x1     Thank you for allowing pharmacy to participate in this patient's care   Sherron Monday, PharmD, BCCCP Clinical Pharmacist  Phone: [phone] 04/21/2020 7:15 AM  Please check AMION for all [facility] Pharmacy phone numbers After 10:00 PM, call Main Pharmacy [phone]

## 2020-04-21 NOTE — Progress Notes (Addendum)
Progress Note  Patient Name: Rebecca Carter Date of Encounter: 04/21/2020  Primary EP: Dr. Curt Bears  Subjective   Slept a little better last night, otherwise no complaints  Inpatient Medications    Scheduled Meds:  atorvastatin  10 mg Oral q1800   azelastine  2 spray Each Nare BID   cholecalciferol  2,000 Units Oral Daily   diltiazem  300 mg Oral Daily   dofetilide  250 mcg Oral BID   fenofibrate  160 mg Oral QODAY   levothyroxine  75 mcg Oral Q0600   loratadine  10 mg Oral Daily   pantoprazole  40 mg Oral QHS   rivaroxaban  20 mg Oral Q supper   sodium chloride flush  3 mL Intravenous Q12H   Continuous Infusions:  sodium chloride     magnesium sulfate bolus IVPB     PRN Meds: sodium chloride, LORazepam, sodium chloride flush   Vital Signs    Vitals:   04/20/20 1406 04/20/20 1751 04/20/20 2005 04/21/20 0541  BP: (!) 110/51 135/85 (!) 122/58 116/66  Pulse: 62  63 (!) 59  Resp: 15  18 17   Temp: 98.4 F (36.9 C) 98.3 F (36.8 C) 98 F (36.7 C) 98.7 F (37.1 C)  TempSrc: Oral Oral Oral Oral  SpO2: 97% 98% 98% 97%  Weight:    79.4 kg  Height:        Intake/Output Summary (Last 24 hours) at 04/21/2020 7510 Last data filed at 04/20/2020 2000 Gross per 24 hour  Intake 946 ml  Output 1400 ml  Net -454 ml   Last 3 Weights 04/21/2020 04/19/2020 04/19/2020  Weight (lbs) 175 lb 1.6 oz 175 lb 176 lb 3.2 oz  Weight (kg) 79.425 kg 79.379 kg 79.924 kg      Telemetry    SR 60's-70's, PAFlutter yesterday 130's for about 3hours - Personally Reviewed  ECG    SR 69, measured QT 440-460, QTc 472-493 - Personally Reviewed  Physical Exam    Exam is unchanged GEN: No acute distress.   Neck: No JVD Cardiac: RRR, no murmurs, rubs, or gallops.  Respiratory: CTA b/l. GI: Soft, nontender, non-distended  MS: No edema; No deformity. Neuro:  Nonfocal  Psych: Normal affect   Labs    High Sensitivity Troponin:  No results for input(s): TROPONINIHS in the last 720 hours.     Chemistry Recent Labs  Lab 04/19/20 1057 04/20/20 0701 04/21/20 0523  NA 144 140 139  K 3.9 4.0 4.1  CL 109 110 104  CO2 25 25 24   GLUCOSE 105* 104* 109*  BUN 25* 22 21  CREATININE 1.35* 1.08* 1.04*  CALCIUM 9.7 8.9 9.3  GFRNONAA 39* 51* 53*  GFRAA 45* 59* >60  ANIONGAP 10 5 11      HematologyNo results for input(s): WBC, RBC, HGB, HCT, MCV, MCH, MCHC, RDW, PLT in the last 168 hours.  BNPNo results for input(s): BNP, PROBNP in the last 168 hours.   DDimer No results for input(s): DDIMER in the last 168 hours.   Radiology    No results found.  Cardiac Studies     TEE 01/07/20 demonstrated  Review of the above records today demonstrates:  The left ventricular size is normal. There is normal left ventricular wall thickness. LV ejection fraction = 60-65%. No segmental wall motion abnormalities seen in the left ventricle The left atrium is moderately dilated. A left-to-right shunt was visualized by color flow Doppler.  Patient Profile     74 y.o. female  with a history of persistent atrial fibrillation, atrial flutter, HLD, hypothyroidism.   She had an afib ablation in 2016 and an afib/flutter ablation by Dr Rudolpho Sevin on 12/29/19 Admitted for Tikosyn initiation   Sotalol failed, stopped 4/15 (prior to that flecainide)  Assessment & Plan    1. Persistent AFib/flutter     CHA2DS2Vasc is 2, on Xarelto     Tikosyn load is in progress      K+ 4.1      Mag 2.0      Creat 1.04 (better today, Cal CrCl 60), xarelto and Tikosyn appropriately dosed      QT looks OK  She had some Aflutter 130's last night, Rebecca Carter increase her dilt Anticipate discharge tomorrow afternoon  2. Hypothyroidism     Home med   For questions or updates, please contact [facility] Please consult [email or web address] for contact info under        Signed, Sheilah Pigeon, PA-C  04/21/2020, 8:22 AM    I have seen and examined this patient with Rebecca Carter.  Agree with above, note added to reflect  my findings.  On exam, RRR, no murmurs, lungs clear. Patient on tikosyn. Had breakthrough atrial flutter yesterday but converted back to sinus rhythm. Rebecca Carter continue tikosyn at the current dose and increase diltiazem to 300 mg.    Rebecca Carter M. Rebecca Debes MD 04/21/2020 8:35 AM

## 2020-04-22 ENCOUNTER — Encounter (HOSPITAL_COMMUNITY): Payer: Self-pay | Admitting: Internal Medicine

## 2020-04-22 DIAGNOSIS — I4819 Other persistent atrial fibrillation: Secondary | ICD-10-CM | POA: Diagnosis not present

## 2020-04-22 LAB — BASIC METABOLIC PANEL
Anion gap: 5 (ref 5–15)
BUN: 20 mg/dL (ref 8–23)
CO2: 24 mmol/L (ref 22–32)
Calcium: 8.7 mg/dL — ABNORMAL LOW (ref 8.9–10.3)
Chloride: 108 mmol/L (ref 98–111)
Creatinine, Ser: 1.09 mg/dL — ABNORMAL HIGH (ref 0.44–1.00)
GFR calc Af Amer: 58 mL/min — ABNORMAL LOW (ref >60)
GFR calc non Af Amer: 50 mL/min — ABNORMAL LOW (ref >60)
Glucose, Bld: 101 mg/dL — ABNORMAL HIGH (ref 70–99)
Potassium: 3.8 mmol/L (ref 3.5–5.1)
Sodium: 137 mmol/L (ref 135–145)

## 2020-04-22 LAB — MAGNESIUM: Magnesium: 2.1 mg/dL (ref 1.7–2.4)

## 2020-04-22 MED ORDER — RIVAROXABAN 20 MG PO TABS: 20.0000 mg | ORAL_TABLET | Freq: Every day | ORAL | 6 refills | Status: DC

## 2020-04-22 MED ORDER — POTASSIUM CHLORIDE CRYS ER 20 MEQ PO TBCR
40.0000 meq | EXTENDED_RELEASE_TABLET | Freq: Once | ORAL | Status: AC
  Administered 2020-04-22: 08:00:00 40 meq via ORAL
  Filled 2020-04-22: qty 2

## 2020-04-22 MED ORDER — DOFETILIDE 125 MCG PO CAPS: 125.0000 ug | ORAL_CAPSULE | Freq: Two times a day (BID) | ORAL | 6 refills | Status: DC

## 2020-04-22 MED ORDER — POTASSIUM CHLORIDE ER 20 MEQ PO TBCR: 20.0000 meq | EXTENDED_RELEASE_TABLET | Freq: Every day | ORAL | 6 refills | Status: DC

## 2020-04-22 MED ORDER — DILTIAZEM HCL ER COATED BEADS 300 MG PO CP24: 300.0000 mg | ORAL_CAPSULE | Freq: Every day | ORAL | 6 refills | Status: DC

## 2020-04-22 MED FILL — DOFETILIDE 125 MCG CAPS: 125 | 30 days supply | Qty: 60 | Fill #0

## 2020-04-22 NOTE — Progress Notes (Signed)
Pharmacy: Dofetilide (Tikosyn) - Follow Up Assessment and Electrolyte Replacement  Pharmacy consulted to assist in monitoring and replacing electrolytes in this 74 y.o. female admitted on 04/19/2020 undergoing dofetilide initiation. First dofetilide dose: 250 mcg bid, given 5/3 @ 2007  Labs:    Component Value Date/Time   K 3.8 04/22/2020 0305   MG 2.1 04/22/2020 0305     Plan: Potassium: K 3.8-3.9:  Give KCl 40 mEq po x1   Magnesium: Mg > 2: No additional supplementation needed  As patient has required on average 20 mEq of potassium replacement every day, recommend discharging patient with prescription for:  Potassium chloride 20 mEq  daily  Thank you for allowing pharmacy to participate in this patient's care   Sherron Monday, PharmD, BCCCP Clinical Pharmacist  Phone: [phone] 04/22/2020 7:15 AM  Please check AMION for all [facility] phone numbers After 10:00 PM, call Main Pharmacy [phone]

## 2020-04-22 NOTE — Discharge Instructions (Addendum)
Information on my medicine - XARELTO (Rivaroxaban)  Why was Xarelto prescribed for you? Xarelto was prescribed for you to reduce the risk of a blood clot forming that can cause a stroke if you have a medical condition called atrial fibrillation (a type of irregular heartbeat).  What do you need to know about xarelto ? Take your Xarelto ONCE DAILY at the same time every day with your evening meal. If you have difficulty swallowing the tablet whole, you may crush it and mix in applesauce just prior to taking your dose.  Take Xarelto exactly as prescribed by your doctor and DO NOT stop taking Xarelto without talking to the doctor who prescribed the medication.  Stopping without other stroke prevention medication to take the place of Xarelto may increase your risk of developing a clot that causes a stroke.  Refill your prescription before you run out.  After discharge, you should have regular check-up appointments with your healthcare provider that is prescribing your Xarelto.  In the future your dose may need to be changed if your kidney function or weight changes by a significant amount.  What do you do if you miss a dose? If you are taking Xarelto ONCE DAILY and you miss a dose, take it as soon as you remember on the same day then continue your regularly scheduled once daily regimen the next day. Do not take two doses of Xarelto at the same time or on the same day.   Important Safety Information A possible side effect of Xarelto is bleeding. You should call your healthcare provider right away if you experience any of the following: ? Bleeding from an injury or your nose that does not stop. ? Unusual colored urine (red or dark brown) or unusual colored stools (red or black). ? Unusual bruising for unknown reasons. ? A serious fall or if you hit your head (even if there is no bleeding).  Some medicines may interact with Xarelto and might increase your risk of bleeding while on  Xarelto. To help avoid this, consult your healthcare provider or pharmacist prior to using any new prescription or non-prescription medications, including herbals, vitamins, non-steroidal anti-inflammatory drugs (NSAIDs) and supplements.  This website has more information on Xarelto: [email or web address]-benson.[email or web address]   Heart-Healthy Eating Plan Heart-healthy meal planning includes:  Eating less unhealthy fats.  Eating more healthy fats.  Making other changes in your diet. Talk with your doctor or a diet specialist (dietitian) to create an eating plan that is right for you. What is my plan? Your doctor may recommend an eating plan that includes:  Total fat: ______% or less of total calories a day.  Saturated fat: ______% or less of total calories a day.  Cholesterol: less than _________mg a day. What are tips for following this plan? Cooking Avoid frying your food. Try to bake, boil, grill, or broil it instead. You can also reduce fat by:  Removing the skin from poultry.  Removing all visible fats from meats.  Steaming vegetables in water or broth. Meal planning   At meals, divide your plate into four equal parts: ? Fill one-half of your plate with vegetables and green salads. ? Fill one-fourth of your plate with whole grains. ? Fill one-fourth of your plate with lean protein foods.  Eat 4-5 servings of vegetables per day. A serving of vegetables is: ? 1 cup of raw or cooked vegetables. ? 2 cups of raw leafy greens.  Eat 4-5 servings of fruit per day. A serving of fruit  is: ? 1 medium whole fruit. ?  cup of dried fruit. ?  cup of fresh, frozen, or canned fruit. ?  cup of 100% fruit juice.  Eat more foods that have soluble fiber. These are apples, broccoli, carrots, beans, peas, and barley. Try to get 20-30 g of fiber per day.  Eat 4-5 servings of nuts, legumes, and seeds per week: ? 1 serving of dried beans or legumes equals  cup after being cooked. ? 1 serving of nuts is   cup. ? 1 serving of seeds equals 1 tablespoon. General information  Eat more home-cooked food. Eat less restaurant, buffet, and fast food.  Limit or avoid alcohol.  Limit foods that are high in starch and sugar.  Avoid fried foods.  Lose weight if you are overweight.  Keep track of how much salt (sodium) you eat. This is important if you have high blood pressure. Ask your doctor to tell you more about this.  Try to add vegetarian meals each week. Fats  Choose healthy fats. These include olive oil and canola oil, flaxseeds, walnuts, almonds, and seeds.  Eat more omega-3 fats. These include salmon, mackerel, sardines, tuna, flaxseed oil, and ground flaxseeds. Try to eat fish at least 2 times each week.  Check food labels. Avoid foods with trans fats or high amounts of saturated fat.  Limit saturated fats. ? These are often found in animal products, such as meats, butter, and cream. ? These are also found in plant foods, such as palm oil, palm kernel oil, and coconut oil.  Avoid foods with partially hydrogenated oils in them. These have trans fats. Examples are stick margarine, some tub margarines, cookies, crackers, and other baked goods. What foods can I eat? Fruits All fresh, canned (in natural juice), or frozen fruits. Vegetables Fresh or frozen vegetables (raw, steamed, roasted, or grilled). Green salads. Grains Most grains. Choose whole wheat and whole grains most of the time. Rice and pasta, including brown rice and pastas made with whole wheat. Meats and other proteins Lean, well-trimmed beef, veal, pork, and lamb. Chicken and [place] without skin. All fish and shellfish. Wild duck, rabbit, pheasant, and venison. Egg whites or low-cholesterol egg substitutes. Dried beans, peas, lentils, and tofu. Seeds and most nuts. Dairy Low-fat or nonfat cheeses, including ricotta and mozzarella. Skim or 1% milk that is liquid, powdered, or evaporated. Buttermilk that is made with  low-fat milk. Nonfat or low-fat yogurt. Fats and oils Non-hydrogenated (trans-free) margarines. Vegetable oils, including soybean, sesame, sunflower, olive, peanut, safflower, corn, canola, and cottonseed. Salad dressings or mayonnaise made with a vegetable oil. Beverages Mineral water. Coffee and tea. Diet carbonated beverages. Sweets and desserts Sherbet, gelatin, and fruit ice. Small amounts of dark chocolate. Limit all sweets and desserts. Seasonings and condiments All seasonings and condiments. The items listed above may not be a complete list of foods and drinks you can eat. Contact a dietitian for more options. What foods should I avoid? Fruits Canned fruit in heavy syrup. Fruit in cream or butter sauce. Fried fruit. Limit coconut. Vegetables Vegetables cooked in cheese, cream, or butter sauce. Fried vegetables. Grains Breads that are made with saturated or trans fats, oils, or whole milk. Croissants. Sweet rolls. Donuts. High-fat crackers, such as cheese crackers. Meats and other proteins Fatty meats, such as hot dogs, ribs, sausage, bacon, rib-eye roast or steak. High-fat deli meats, such as salami and bologna. Caviar. Domestic duck and goose. Organ meats, such as liver. Dairy Cream, sour cream, cream cheese, and  creamed cottage cheese. Whole-milk cheeses. Whole or 2% milk that is liquid, evaporated, or condensed. Whole buttermilk. Cream sauce or high-fat cheese sauce. Yogurt that is made from whole milk. Fats and oils Meat fat, or shortening. Cocoa butter, hydrogenated oils, palm oil, coconut oil, palm kernel oil. Solid fats and shortenings, including bacon fat, salt pork, lard, and butter. Nondairy cream substitutes. Salad dressings with cheese or sour cream. Beverages Regular sodas and juice drinks with added sugar. Sweets and desserts Frosting. Pudding. Cookies. Cakes. Pies. Milk chocolate or white chocolate. Buttered syrups. Full-fat ice cream or ice cream drinks. The items  listed above may not be a complete list of foods and drinks to avoid. Contact a dietitian for more information. Summary  Heart-healthy meal planning includes eating less unhealthy fats, eating more healthy fats, and making other changes in your diet.  Eat a balanced diet. This includes fruits and vegetables, low-fat or nonfat dairy, lean protein, nuts and legumes, whole grains, and heart-healthy oils and fats. This information is not intended to replace advice given to you by your health care provider. Make sure you discuss any questions you have with your health care provider. Document Revised: 02/07/2018 Document Reviewed: 01/11/2018 Elsevier Patient Education  2020 [employer].

## 2020-04-22 NOTE — Discharge Summary (Addendum)
ELECTROPHYSIOLOGY PROCEDURE DISCHARGE SUMMARY    Patient ID: Rebecca Carter,  MRN: [id number], DOB/AGE: 1946-11-27 74 y.o.  Admit date: 04/19/2020 Discharge date: 04/22/2020  Primary Care Physician: Jonathon Resides, MD Electrophysiologist: Dr. Curt Bears  Primary Discharge Diagnosis:  1.  persistent atrial fibrillation/flutter status post Tikosyn loading this admission      CHA2DS2Vasc is 2, on xarelto, appropriately dosed  Secondary Discharge Diagnosis:  1. Hypothyroidism 2. HLD  Allergies  Allergen Reactions   Sulfa Antibiotics Rash    rash   Augmentin [Amoxicillin-Pot Clavulanate] Other (See Comments)    Stomach issues   Trazodone     nightmares   Latex Rash    Other reaction(s): RASH      Procedures This Admission:  1.  Tikosyn loading    Brief HPI: Rebecca Carter is a 74 y.o. female with a past medical history as noted above.  They were referred to EP in the outpatient setting for treatment options of atrial fibrillation.  Risks, benefits, and alternatives to Tikosyn were reviewed with the patient who wished to proceed.    Hospital Course:  The patient was admitted and Tikosyn was initiated.  Renal function and electrolytes were followed during the hospitalization.  She required potassium replacement while here, pharmacy recommends discharging with 34meq daily.  Her QTc had some prolongation requiring decrease in her dose to 173mcg, her final EKG noted improved QTc.  She arrived in [place], did have an episode of PAFlutter w/RVR and her diltiazem was increased   On the day of discharge, she feels well, was examined by Dr Curt Bears  who considered the patient stable for discharge to home.  Follow-up has been arranged with the AFib clinic in 1 week and with Dr Curt Bears in 4 weeks.   Physical Exam: Vitals:   04/21/20 0853 04/21/20 2137 04/22/20 0038 04/22/20 0339  BP: 138/60 116/68 116/66 138/66  Pulse:  62 60 62  Resp:  18 18 18   Temp:  98.5 F (36.9 C) 98.5 F (36.9 C)  98.5 F (36.9 C)  TempSrc:  Oral Oral Oral  SpO2: 98% 98% 98% 98%  Weight:    79 kg  Height:        GEN- The patient is well appearing, alert and oriented x 3 today.   HEENT: normocephalic, atraumatic; sclera clear, conjunctiva pink; hearing intact; oropharynx clear; neck supple, no JVP Lymph- no cervical lymphadenopathy Lungs- CTA b/l, normal work of breathing.  No wheezes, rales, rhonchi Heart- RRR, no murmurs, rubs or gallops, PMI not laterally displaced GI- soft, non-tender, non-distended Extremities- no clubbing, cyanosis, or edema MS- no significant deformity or atrophy Skin- warm and dry, no rash or lesion Psych- euthymic mood, full affect Neuro- strength and sensation are intact   Labs:   Lab Results  Component Value Date   WBC 3.1 (L) 08/14/2018   HGB 12.7 08/14/2018   HCT 37.3 08/14/2018   MCV 90.1 08/14/2018   PLT 207 08/14/2018    Recent Labs  Lab 04/22/20 0305  NA 137  K 3.8  CL 108  CO2 24  BUN 20  CREATININE 1.09*  CALCIUM 8.7*  GLUCOSE 101*     Discharge Medications:  Allergies as of 04/22/2020       Reactions   Sulfa Antibiotics Rash   rash   Augmentin [amoxicillin-pot Clavulanate] Other (See Comments)   Stomach issues   Trazodone    nightmares   Latex Rash   Other reaction(s): RASH  Medication List     STOP taking these medications    diltiazem 240 MG 24 hr capsule Commonly known as: TIAZAC Replaced by: diltiazem 300 MG 24 hr capsule   sotalol 80 MG tablet Commonly known as: BETAPACE       TAKE these medications    atorvastatin 20 MG tablet Commonly known as: LIPITOR Take 20 mg by mouth See admin instructions. Alternate days with Tricor 145mg    azelastine 0.1 % nasal spray Commonly known as: ASTELIN Place 2 sprays into both nostrils 2 (two) times daily.   diltiazem 300 MG 24 hr capsule Commonly known as: CARDIZEM CD Take 1 capsule (300 mg total) by mouth daily. Replaces: diltiazem 240 MG 24 hr capsule     dofetilide 125 MCG capsule Commonly known as: TIKOSYN Take 1 capsule (125 mcg total) by mouth 2 (two) times daily.   fenofibrate 145 MG tablet Commonly known as: TRICOR Take 145 mg by mouth every other day. Alternate with Lipitor   fexofenadine 180 MG tablet Commonly known as: ALLEGRA Take by mouth as needed.   levothyroxine 75 MCG tablet Commonly known as: SYNTHROID Take 75 mcg by mouth daily before breakfast.   pantoprazole 40 MG tablet Commonly known as: PROTONIX Take 40 mg by mouth at bedtime.   Potassium Chloride ER 20 MEQ Tbcr Take 20 mEq by mouth daily.   rivaroxaban 20 MG Tabs tablet Commonly known as: XARELTO Take 1 tablet (20 mg total) by mouth daily with supper.   Vitamin D3 50 MCG (2000 UT) capsule Take 2,000 Units by mouth daily.        Disposition: Home      Duration of Discharge Encounter: Greater than 30 minutes including physician time.  Signed, , PA-C 04/22/2020 11:15 AM    I have seen and examined this patient with 06/22/2020.  Agree with above, note added to reflect my findings.  On exam, RRR, no murmurs, lungs clear.  Patient admitted to the hospital for dofetilide load.  QTC prolonged and her dose was decreased to 125 mcg twice daily.  Tolerated this dose well.  Plan for discharge with follow-up in A. fib clinic.  Icy Fuhrmann M. [place] Forquer MD 04/23/2020 7:21 AM

## 2020-04-29 ENCOUNTER — Other Ambulatory Visit: Payer: Self-pay

## 2020-04-29 ENCOUNTER — Ambulatory Visit (HOSPITAL_COMMUNITY)
Admit: 2020-04-29 | Discharge: 2020-04-29 | Disposition: A | Discharge status: Home / Self Care | Payer: Medicare PPO | Source: Ambulatory Visit | Attending: Physician Assistant | Admitting: Physician Assistant

## 2020-04-29 VITALS — BP 136/80 | HR 137 | Ht 69.0 in | Wt 176.2 lb

## 2020-04-29 DIAGNOSIS — I471 Supraventricular tachycardia: Secondary | ICD-10-CM | POA: Insufficient documentation

## 2020-04-29 DIAGNOSIS — E785 Hyperlipidemia, unspecified: Secondary | ICD-10-CM | POA: Insufficient documentation

## 2020-04-29 DIAGNOSIS — Z881 Allergy status to other antibiotic agents status: Secondary | ICD-10-CM | POA: Insufficient documentation

## 2020-04-29 DIAGNOSIS — I4892 Unspecified atrial flutter: Secondary | ICD-10-CM | POA: Diagnosis not present

## 2020-04-29 DIAGNOSIS — I4819 Other persistent atrial fibrillation: Secondary | ICD-10-CM | POA: Insufficient documentation

## 2020-04-29 DIAGNOSIS — Z7901 Long term (current) use of anticoagulants: Secondary | ICD-10-CM | POA: Diagnosis not present

## 2020-04-29 DIAGNOSIS — Z7902 Long term (current) use of antithrombotics/antiplatelets: Secondary | ICD-10-CM | POA: Diagnosis not present

## 2020-04-29 DIAGNOSIS — Z79899 Other long term (current) drug therapy: Secondary | ICD-10-CM | POA: Insufficient documentation

## 2020-04-29 DIAGNOSIS — Z79890 Hormone replacement therapy (postmenopausal): Secondary | ICD-10-CM | POA: Insufficient documentation

## 2020-04-29 DIAGNOSIS — E039 Hypothyroidism, unspecified: Secondary | ICD-10-CM | POA: Insufficient documentation

## 2020-04-29 DIAGNOSIS — I484 Atypical atrial flutter: Secondary | ICD-10-CM | POA: Diagnosis not present

## 2020-04-29 LAB — BASIC METABOLIC PANEL
Anion gap: 11 (ref 5–15)
BUN: 18 mg/dL (ref 8–23)
CO2: 23 mmol/L (ref 22–32)
Calcium: 9.4 mg/dL (ref 8.9–10.3)
Chloride: 106 mmol/L (ref 98–111)
Creatinine, Ser: 1.05 mg/dL — ABNORMAL HIGH (ref 0.44–1.00)
GFR calc Af Amer: >60 mL/min (ref >60)
GFR calc non Af Amer: 53 mL/min — ABNORMAL LOW (ref >60)
Glucose, Bld: 113 mg/dL — ABNORMAL HIGH (ref 70–99)
Potassium: 4.3 mmol/L (ref 3.5–5.1)
Sodium: 140 mmol/L (ref 135–145)

## 2020-04-29 LAB — MAGNESIUM: Magnesium: 2.1 mg/dL (ref 1.7–2.4)

## 2020-04-29 MED ORDER — DILTIAZEM HCL ER COATED BEADS 360 MG PO CP24: 360.0000 mg | ORAL_CAPSULE | Freq: Every day | ORAL | 3 refills | Status: DC

## 2020-04-29 NOTE — Progress Notes (Signed)
Primary Care Physician: Rebecca Resides, MD Primary Cardiologist: Dr Rebecca Carter Primary Electrophysiologist: Dr Rebecca Carter Referring Physician: Dr Rebecca Carter is a 74 y.o. female with a history of persistent atrial fibrillation, atrial flutter, HLD, hypothyroidism who presents for follow up in the [facility]. Patient is on Xarelto for a CHADS2VASC score of 2. She had an afib ablation in 2016 and an afib/flutter ablation by Dr Rebecca Carter on 12/29/19. Patient continued to have paroxysms of afib and presented to Dr Rebecca Carter for a second opinion on 04/01/20.   On follow up today, patient is s/p dofetilide admission 5/3-04/22/20. She did not require DCCV. Unfortunately, she has had three episodes of afib since leaving the hospital. The most recent episode is ongoing today. She is disappointed that the dofetilide does not appear to be helping.   Today, she denies symptoms of chest pain, orthopnea, PND, lower extremity edema, dizziness, presyncope, syncope, bleeding, or neurologic sequela. The patient is tolerating medications without difficulties and is otherwise without complaint today.    Atrial Fibrillation Risk Factors:  she does not have symptoms or diagnosis of sleep apnea. she does not have a history of rheumatic fever. she does not have a history of alcohol use.   she has a BMI of Body mass index is 26.02 kg/m.Marland Kitchen Filed Weights   04/29/20 1410  Weight: 79.9 kg    Family History  Problem Relation Age of Onset  . Asthma Mother   . Asthma Sister      Atrial Fibrillation Management history:  Previous antiarrhythmic drugs: flecainide, sotalol Previous cardioversions: none Previous ablations: 2016, 12/29/19 CHADS2VASC score: 2 Anticoagulation history: Xarelto    Past Medical History:  Diagnosis Date  . Allergic rhinitis    Past Surgical History:  Procedure Laterality Date  . ADENOIDECTOMY    . APPENDECTOMY    . HERNIA REPAIR    . pulmonary  vein abrasion    . SHOULDER ARTHROSCOPY    . TONSILLECTOMY      Current Outpatient Medications  Medication Sig Dispense Refill  . atorvastatin (LIPITOR) 20 MG tablet Take 20 mg by mouth every other day. Alternate days with Tricor 145mg     . azelastine (ASTELIN) 0.1 % nasal spray Place 2 sprays into both nostrils 2 (two) times daily. (Patient taking differently: Place 2 sprays into both nostrils as needed. ) 30 mL 5  . Cholecalciferol (VITAMIN D3) 2000 units capsule Take 2,000 Units by mouth daily.     Marland Kitchen diltiazem (CARDIZEM CD) 360 MG 24 hr capsule Take 1 capsule (360 mg total) by mouth daily. 30 capsule 3  . dofetilide (TIKOSYN) 125 MCG capsule Take 1 capsule (125 mcg total) by mouth 2 (two) times daily. 60 capsule 6  . fenofibrate (TRICOR) 145 MG tablet Take 145 mg by mouth every other day. Alternate with Lipitor    . fexofenadine (ALLEGRA) 180 MG tablet Take by mouth as needed.     Marland Kitchen levothyroxine (SYNTHROID, LEVOTHROID) 75 MCG tablet Take 75 mcg by mouth daily before breakfast.     . pantoprazole (PROTONIX) 40 MG tablet Take 40 mg by mouth at bedtime.    . potassium chloride 20 MEQ TBCR Take 20 mEq by mouth daily. 30 tablet 6  . rivaroxaban (XARELTO) 20 MG TABS tablet Take 1 tablet (20 mg total) by mouth daily with supper. 30 tablet 6   No current facility-administered medications for this encounter.    Allergies  Allergen Reactions  . Sulfa Antibiotics Rash  rash  . Augmentin [Amoxicillin-Pot Clavulanate] Other (See Comments)    Stomach issues  . Trazodone     nightmares  . Latex Rash    Other reaction(s): RASH     Social History   Socioeconomic History  . Marital status: Married    Spouse name: Not on file  . Number of children: Not on file  . Years of education: Not on file  . Highest education level: Not on file  Occupational History  . Not on file  Tobacco Use  . Smoking status: Never Smoker  . Smokeless tobacco: Never Used  Substance and Sexual Activity  .  Alcohol use: Yes    Alcohol/week: 2.0 standard drinks    Types: 1 Glasses of wine, 1 Cans of beer per week  . Drug use: No  . Sexual activity: Not on file  Other Topics Concern  . Not on file  Social History Narrative  . Not on file   Social Determinants of Health   Financial Resource Strain:   . Difficulty of Paying Living Expenses:   Food Insecurity:   . Worried About Programme researcher, broadcasting/film/video in the Last Year:   . Barista in the Last Year:   Transportation Needs:   . Freight forwarder (Medical):   Marland Kitchen Lack of Transportation (Non-Medical):   Physical Activity:   . Days of Exercise per Week:   . Minutes of Exercise per Session:   Stress:   . Feeling of Stress :   Social Connections:   . Frequency of Communication with Friends and Family:   . Frequency of Social Gatherings with Friends and Family:   . Attends Religious Services:   . Active Member of Clubs or Organizations:   . Attends Banker Meetings:   Marland Kitchen Marital Status:   Intimate Partner Violence:   . Fear of Current or Ex-Partner:   . Emotionally Abused:   Marland Kitchen Physically Abused:   . Sexually Abused:      ROS- All systems are reviewed and negative except as per the HPI above.  Physical Exam: Vitals:   04/29/20 1410  BP: 136/80  Pulse: (!) 137  Weight: 79.9 kg  Height: 5\' 9"  (1.753 m)    GEN- The patient is well appearing, alert and oriented x 3 today.   HEENT-head normocephalic, atraumatic, sclera clear, conjunctiva pink, hearing intact, trachea midline. Lungs- Clear to ausculation bilaterally, normal work of breathing Heart- Regular rate and rhythm, tachycardia, no murmurs, rubs or gallops  GI- soft, NT, ND, + BS Extremities- no clubbing, cyanosis, or edema MS- no significant deformity or atrophy Skin- no rash or lesion Psych- euthymic mood, full affect Neuro- strength and sensation are intact   Wt Readings from Last 3 Encounters:  04/29/20 79.9 kg  04/22/20 79 kg  04/19/20 79.9 kg     EKG today demonstrates atypical atrial flutter vs SVT HR 137, LAD, QRS 88, QTc 477  TEE 01/07/20 demonstrated  Review of the above records today demonstrates:  The left ventricular size is normal. There is normal left ventricular wall thickness. LV ejection fraction = 60-65%. No segmental wall motion abnormalities seen in the left ventricle The left atrium is moderately dilated. A left-to-right shunt was visualized by color flow Doppler.  Epic records are reviewed at length today  CHA2DS2-VASc Score = 2  The patient's score is based upon: CHF History: 0 HTN History: 0 Age : 1 Diabetes History: 0 Stroke History: 0 Vascular Disease History: 0  Gender: 1     ASSESSMENT AND PLAN: 1. Persistent Atrial Fibrillation/atrial flutter The patient's CHA2DS2-VASc score is 2, indicating a 2.2% annual risk of stroke.   S/p afib ablation x2 and atrial flutter ablation.  Loaded on dofetilide 5/3-04/22/20. We discussed therapeutic options today including continuing dofetilide, changing to amiodarone, surgical ablation, and AV node ablation.  Will give the dofetilide a couple more weeks before making changes. Will increase diltiazem to 360 mg daily. Continue dofetilide 125 mcg BID Bmet/mag today. Continue Xarelto 20 mg daily Patient to track afib burden on Apple Watch.   Follow up in the AF clinic in 2 weeks.   Rebecca Loa PA-C [facility] [address] [place], [place] [address] [phone] 04/29/2020 3:15 PM

## 2020-04-29 NOTE — Patient Instructions (Signed)
Increase cardizem to 360mg once a day 

## 2020-05-12 ENCOUNTER — Encounter (HOSPITAL_COMMUNITY): Payer: Self-pay | Admitting: Physician Assistant

## 2020-05-12 ENCOUNTER — Ambulatory Visit (HOSPITAL_COMMUNITY)
Admission: RE | Admit: 2020-05-12 | Discharge: 2020-05-12 | Disposition: A | Discharge status: Home / Self Care | Payer: Medicare PPO | Source: Ambulatory Visit | Attending: Physician Assistant | Admitting: Physician Assistant

## 2020-05-12 ENCOUNTER — Other Ambulatory Visit: Payer: Self-pay

## 2020-05-12 VITALS — BP 134/64 | HR 77 | Ht 69.0 in | Wt 175.6 lb

## 2020-05-12 DIAGNOSIS — I4892 Unspecified atrial flutter: Secondary | ICD-10-CM | POA: Diagnosis not present

## 2020-05-12 DIAGNOSIS — E785 Hyperlipidemia, unspecified: Secondary | ICD-10-CM | POA: Diagnosis not present

## 2020-05-12 DIAGNOSIS — I4819 Other persistent atrial fibrillation: Secondary | ICD-10-CM | POA: Insufficient documentation

## 2020-05-12 DIAGNOSIS — E039 Hypothyroidism, unspecified: Secondary | ICD-10-CM | POA: Diagnosis not present

## 2020-05-12 DIAGNOSIS — Z79899 Other long term (current) drug therapy: Secondary | ICD-10-CM | POA: Insufficient documentation

## 2020-05-12 NOTE — Progress Notes (Signed)
Primary Care Physician: Jonathon Resides, MD Primary Cardiologist: Dr Minna Merritts Primary Electrophysiologist: Dr Curt Bears Referring Physician: Dr Kimberlee Nearing Rebecca Carter is a 74 y.o. female with a history of persistent atrial fibrillation, atrial flutter, HLD, hypothyroidism who presents for follow up in the [facility]. Patient is on Xarelto for a CHADS2VASC score of 2. She had an afib ablation in 2016 and an afib/flutter ablation by Dr Minna Merritts on 12/29/19. Patient continued to have paroxysms of afib and presented to Dr Curt Bears for a second opinion on 04/01/20. Patient is s/p dofetilide admission 5/3-04/22/20. She did not require DCCV.   On follow up today, patient reports that she has done reasonably well since her last visit. She did have one episode of afib which lasted ~24 hours after having some wine. She is tolerating the medication without difficulty. She denies bleeding issues on anticoagulation.   Today, she denies symptoms of chest pain, orthopnea, PND, lower extremity edema, dizziness, presyncope, syncope, bleeding, or neurologic sequela. The patient is tolerating medications without difficulties and is otherwise without complaint today.    Atrial Fibrillation Risk Factors:  she does not have symptoms or diagnosis of sleep apnea. she does not have a history of rheumatic fever. she does not have a history of alcohol use.   she has a BMI of Body mass index is 25.93 kg/m.Marland Kitchen Filed Weights   05/12/20 1332  Weight: 79.7 kg    Family History  Problem Relation Age of Onset  . Asthma Mother   . Asthma Sister      Atrial Fibrillation Management history:  Previous antiarrhythmic drugs: flecainide, sotalol, dofetilide  Previous cardioversions: none Previous ablations: 2016, 12/29/19 CHADS2VASC score: 2 Anticoagulation history: Xarelto    Past Medical History:  Diagnosis Date  . Allergic rhinitis    Past Surgical History:  Procedure Laterality Date    . ADENOIDECTOMY    . APPENDECTOMY    . HERNIA REPAIR    . pulmonary vein abrasion    . SHOULDER ARTHROSCOPY    . TONSILLECTOMY      Current Outpatient Medications  Medication Sig Dispense Refill  . atorvastatin (LIPITOR) 20 MG tablet Take 20 mg by mouth every other day. Alternate days with Tricor 145mg     . azelastine (ASTELIN) 0.1 % nasal spray Place 2 sprays into both nostrils 2 (two) times daily. (Patient taking differently: Place 2 sprays into both nostrils as needed. ) 30 mL 5  . Cholecalciferol (VITAMIN D3) 2000 units capsule Take 2,000 Units by mouth daily.     Marland Kitchen diltiazem (CARDIZEM CD) 360 MG 24 hr capsule Take 1 capsule (360 mg total) by mouth daily. 30 capsule 3  . dofetilide (TIKOSYN) 125 MCG capsule Take 1 capsule (125 mcg total) by mouth 2 (two) times daily. 60 capsule 6  . fenofibrate (TRICOR) 145 MG tablet Take 145 mg by mouth every other day. Alternate with Lipitor    . fexofenadine (ALLEGRA) 180 MG tablet Take by mouth as needed.     Marland Kitchen levothyroxine (SYNTHROID, LEVOTHROID) 75 MCG tablet Take 75 mcg by mouth daily before breakfast.     . pantoprazole (PROTONIX) 40 MG tablet Take 40 mg by mouth at bedtime.    . potassium chloride 20 MEQ TBCR Take 20 mEq by mouth daily. 30 tablet 6  . rivaroxaban (XARELTO) 20 MG TABS tablet Take 1 tablet (20 mg total) by mouth daily with supper. 30 tablet 6   No current facility-administered medications for this encounter.  Allergies  Allergen Reactions  . Sulfa Antibiotics Rash    rash  . Augmentin [Amoxicillin-Pot Clavulanate] Other (See Comments)    Stomach issues  . Trazodone     nightmares  . Latex Rash    Other reaction(s): RASH     Social History   Socioeconomic History  . Marital status: Married    Spouse name: Not on file  . Number of children: Not on file  . Years of education: Not on file  . Highest education level: Not on file  Occupational History  . Not on file  Tobacco Use  . Smoking status: Never  Smoker  . Smokeless tobacco: Never Used  Substance and Sexual Activity  . Alcohol use: Yes    Alcohol/week: 2.0 standard drinks    Types: 1 Glasses of wine, 1 Cans of beer per week  . Drug use: No  . Sexual activity: Not on file  Other Topics Concern  . Not on file  Social History Narrative  . Not on file   Social Determinants of Health   Financial Resource Strain:   . Difficulty of Paying Living Expenses:   Food Insecurity:   . Worried About Programme researcher, broadcasting/film/video in the Last Year:   . Barista in the Last Year:   Transportation Needs:   . Freight forwarder (Medical):   Marland Kitchen Lack of Transportation (Non-Medical):   Physical Activity:   . Days of Exercise per Week:   . Minutes of Exercise per Session:   Stress:   . Feeling of Stress :   Social Connections:   . Frequency of Communication with Friends and Family:   . Frequency of Social Gatherings with Friends and Family:   . Attends Religious Services:   . Active Member of Clubs or Organizations:   . Attends Banker Meetings:   Marland Kitchen Marital Status:   Intimate Partner Violence:   . Fear of Current or Ex-Partner:   . Emotionally Abused:   Marland Kitchen Physically Abused:   . Sexually Abused:      ROS- All systems are reviewed and negative except as per the HPI above.  Physical Exam: Vitals:   05/12/20 1332  BP: 134/64  Pulse: 77  Weight: 79.7 kg  Height: 5\' 9"  (1.753 m)    GEN- The patient is well appearing, alert and oriented x 3 today.   HEENT-head normocephalic, atraumatic, sclera clear, conjunctiva pink, hearing intact, trachea midline. Lungs- Clear to ausculation bilaterally, normal work of breathing Heart- Regular rate and rhythm, no murmurs, rubs or gallops  GI- soft, NT, ND, + BS Extremities- no clubbing, cyanosis, or edema MS- no significant deformity or atrophy Skin- no rash or lesion Psych- euthymic mood, full affect Neuro- strength and sensation are intact   Wt Readings from Last 3  Encounters:  05/12/20 79.7 kg  04/29/20 79.9 kg  04/22/20 79 kg    EKG today demonstrates SR HR 77, LAFB, PR 196, QRS 90, QTc 453 manually calculated, reviewed with Dr 06/22/20  TEE 01/07/20 demonstrated  Review of the above records today demonstrates:  The left ventricular size is normal. There is normal left ventricular wall thickness. LV ejection fraction = 60-65%. No segmental wall motion abnormalities seen in the left ventricle The left atrium is moderately dilated. A left-to-right shunt was visualized by color flow Doppler.  Epic records are reviewed at length today  CHA2DS2-VASc Score = 2  The patient's score is based upon: CHF History: 0 HTN History:  0 Age : 1 Diabetes History: 0 Stroke History: 0 Vascular Disease History: 0 Gender: 1     ASSESSMENT AND PLAN: 1. Persistent Atrial Fibrillation/atrial flutter The patient's CHA2DS2-VASc score is 2, indicating a 2.2% annual risk of stroke.   S/p afib ablation x2 and atrial flutter ablation. Afib burden improved. Counseled to avoid alcohol.   Loaded on dofetilide 5/3-04/22/20. Continue diltiazem to 360 mg daily. Continue dofetilide 125 mcg BID. QT stable, reviewed with Dr Elberta Fortis. Continue Xarelto 20 mg daily Patient to track afib burden on Apple Watch.   Follow up with Dr Elberta Fortis as scheduled.    Jorja Loa PA-C [facility] [address] [place], [place] [address] [phone] 05/12/2020 2:19 PM

## 2020-05-27 ENCOUNTER — Other Ambulatory Visit: Payer: Self-pay

## 2020-05-27 ENCOUNTER — Encounter: Payer: Self-pay | Admitting: Cardiology

## 2020-05-27 ENCOUNTER — Ambulatory Visit (INDEPENDENT_AMBULATORY_CARE_PROVIDER_SITE_OTHER): Payer: Medicare PPO | Admitting: Cardiology

## 2020-05-27 VITALS — BP 124/54 | HR 65 | Ht 69.5 in | Wt 178.4 lb

## 2020-05-27 DIAGNOSIS — I4819 Other persistent atrial fibrillation: Secondary | ICD-10-CM

## 2020-05-27 MED ORDER — METOPROLOL TARTRATE 25 MG PO TABS
25.0000 mg | ORAL_TABLET | Freq: Two times a day (BID) | ORAL | 3 refills | Status: DC | PRN
Start: 2020-05-27 — End: 2022-01-18

## 2020-05-27 NOTE — Patient Instructions (Addendum)
Medication Instructions:  Your physician has recommended you make the following change in your medication:  1. START Metoprolol (Lopressor) 25 mg twice a day AS NEEDED for palpitations.  *If you need a refill on your cardiac medications before your next appointment, please call your pharmacy*   Lab Work: None ordered   Testing/Procedures: None ordered   Follow-Up: At [facility], you and your health needs are our priority.  As part of our continuing mission to provide you with exceptional heart care, we have created designated Provider Care Teams.  These Care Teams include your primary Cardiologist (physician) and Advanced Practice Providers (APPs -  Physician Assistants and Nurse Practitioners) who all work together to provide you with the care you need, when you need it.  We recommend signing up for the patient portal called "MyChart".  Sign up information is provided on this After Visit Summary.  MyChart is used to connect with patients for Virtual Visits (Telemedicine).  Patients are able to view lab/test results, encounter notes, upcoming appointments, etc.  Non-urgent messages can be sent to your provider as well.   To learn more about what you can do with MyChart, go to [email or web address].    Your next appointment:   6 month(s)  The format for your next appointment:   In Person  Provider:   Loman Brooklyn, MD   Thank you for choosing [facility]!!   Dory Horn, RN [phone]    Other Instructions  Metoprolol Tablets What is this medicine? METOPROLOL (me TOE proe lole) is a beta blocker. It decreases the amount of work your heart has to do and helps your heart beat regularly. It is used to treat high blood pressure and/or prevent chest pain (also called angina). It is also used after a heart attack to prevent a second one. This medicine may be used for other purposes; ask your health care provider or pharmacist if you have questions. COMMON BRAND  NAME(S): Lopressor What should I tell my health care provider before I take this medicine? They need to know if you have any of these conditions:  diabetes  heart or vessel disease like slow heart rate, worsening heart failure, heart block, sick sinus syndrome or Raynaud's disease  kidney disease  liver disease  lung or breathing disease, like asthma or emphysema  pheochromocytoma  thyroid disease  an unusual or allergic reaction to metoprolol, other beta-blockers, medicines, foods, dyes, or preservatives  pregnant or trying to get pregnant  breast-feeding How should I use this medicine? Take this drug by mouth with water. Take it as directed on the prescription label at the same time every day. You can take it with or without food. You should always take it the same way. Keep taking it unless your health care provider tells you to stop. Talk to your health care provider about the use of this drug in children. Special care may be needed. Overdosage: If you think you have taken too much of this medicine contact a poison control center or emergency room at once. NOTE: This medicine is only for you. Do not share this medicine with others. What if I miss a dose? If you miss a dose, take it as soon as you can. If it is almost time for your next dose, take only that dose. Do not take double or extra doses. What may interact with this medicine? This medicine may interact with the following medications:  certain medicines for blood pressure, heart disease, irregular heart beat  certain medicines for depression like monoamine oxidase (MAO) inhibitors, fluoxetine, or paroxetine  clonidine  dobutamine  epinephrine  isoproterenol  reserpine This list may not describe all possible interactions. Give your health care provider a list of all the medicines, herbs, non-prescription drugs, or dietary supplements you use. Also tell them if you smoke, drink alcohol, or use illegal drugs.  Some items may interact with your medicine. What should I watch for while using this medicine? Visit your doctor or health care professional for regular check ups. Contact your doctor right away if your symptoms worsen. Check your blood pressure and pulse rate regularly. Ask your health care professional what your blood pressure and pulse rate should be, and when you should contact them. You may get drowsy or dizzy. Do not drive, use machinery, or do anything that needs mental alertness until you know how this medicine affects you. Do not sit or stand up quickly, especially if you are an older patient. This reduces the risk of dizzy or fainting spells. Contact your doctor if these symptoms continue. Alcohol may interfere with the effect of this medicine. Avoid alcoholic drinks. This medicine may increase blood sugar. Ask your healthcare provider if changes in diet or medicines are needed if you have diabetes. What side effects may I notice from receiving this medicine? Side effects that you should report to your doctor or health care professional as soon as possible:  allergic reactions like skin rash, itching or hives  cold or numb hands or feet  depression  difficulty breathing  faint  fever with sore throat  irregular heartbeat, chest pain  rapid weight gain   signs and symptoms of high blood sugar such as being more thirsty or hungry or having to urinate more than normal. You may also feel very tired or have blurry vision.  swollen legs or ankles Side effects that usually do not require medical attention (report to your doctor or health care professional if they continue or are bothersome):  anxiety or nervousness  change in sex drive or performance  dry skin  headache  nightmares or trouble sleeping  short term memory loss  stomach upset or diarrhea This list may not describe all possible side effects. Call your doctor for medical advice about side effects. You may  report side effects to FDA at 1-800-FDA-1088. Where should I keep my medicine? Keep out of the reach of children and pets. Store at room temperature between 15 and 30 degrees C (59 and 86 degrees F). Protect from moisture. Keep the container tightly closed. Throw away any unused drug after the expiration date. NOTE: This sheet is a summary. It may not cover all possible information. If you have questions about this medicine, talk to your doctor, pharmacist, or health care provider.  2020 Elsevier/Gold Standard (2019-07-17 17:21:17)

## 2020-05-27 NOTE — Progress Notes (Signed)
Electrophysiology Office Note   Date:  05/27/2020   ID:  Rebecca Carter, DOB Feb 10, 1946, MRN [id number]  PCP:  Gillian Scarce, MD  Cardiologist: Rudolpho Sevin Primary Electrophysiologist:  Daphane Odekirk Jorja Loa, MD    Chief Complaint: Second opinion   History of Present Illness: Rebecca Carter is a 74 y.o. female who is being seen today for the evaluation of atrial fibrillation at the request of Zanard, Hinton Dyer, MD. Presenting today for electrophysiology evaluation.  She has a history significant for atrial fibrillation and atrial flutter and is now status post ablation 12/29/2019 by Dr. Rudolpho Sevin, hypothyroidism, hyperlipidemia.  She is currently on sotalol and Xarelto.  During her ablation, she had repeat PVI.  She went into an atypical atrial flutter and had ablation from the right superior pulmonary vein to the mitral valve which changed activation to a roof-dependent flutter.  Ablation was performed from the right to the left superior pulmonary veins which resulted in termination of tachycardia.  She also had a cavotricuspid isthmus ablation.  She is now status post admission for dofetilide loading.  Today, denies symptoms of palpitations, chest pain, shortness of breath, orthopnea, PND, lower extremity edema, claudication, dizziness, presyncope, syncope, bleeding, or neurologic sequela. The patient is tolerating medications without difficulties.  She is currently feeling well.  She states that her atrial fibrillation burden is significantly decreased since being loaded on dofetilide.  She does continue to have a few episodes, longest of which lasted 5 hours.  She says that her heart rates were quite elevated during that time.   Past Medical History:  Diagnosis Date  . Allergic rhinitis    Past Surgical History:  Procedure Laterality Date  . ADENOIDECTOMY    . APPENDECTOMY    . HERNIA REPAIR    . pulmonary vein abrasion    . SHOULDER ARTHROSCOPY    . TONSILLECTOMY       Current  Outpatient Medications  Medication Sig Dispense Refill  . atorvastatin (LIPITOR) 20 MG tablet Take 20 mg by mouth every other day. Alternate days with Tricor 145mg     . azelastine (ASTELIN) 0.1 % nasal spray Place 2 sprays into both nostrils 2 (two) times daily. 30 mL 5  . Cholecalciferol (VITAMIN D3) 2000 units capsule Take 2,000 Units by mouth daily.     diltiazem (CARDIZEM CD) 360 MG 24 hr capsule Take 1 capsule (360 mg total) by mouth daily. 30 capsule 3  . dofetilide (TIKOSYN) 125 MCG capsule Take 1 capsule (125 mcg total) by mouth 2 (two) times daily. 60 capsule 6  . fexofenadine (ALLEGRA) 180 MG tablet Take by mouth as needed.     . pantoprazole (PROTONIX) 40 MG tablet Take 40 mg by mouth at bedtime.    . potassium chloride 20 MEQ TBCR Take 20 mEq by mouth daily. 30 tablet 6  . rivaroxaban (XARELTO) 20 MG TABS tablet Take 1 tablet (20 mg total) by mouth daily with supper. 30 tablet 6  . fenofibrate (TRICOR) 145 MG tablet Take 145 mg by mouth every other day. Alternate with Lipitor    . levothyroxine (SYNTHROID, LEVOTHROID) 75 MCG tablet Take 75 mcg by mouth daily before breakfast.      No current facility-administered medications for this visit.    Allergies:   Sulfa antibiotics, Augmentin [amoxicillin-pot clavulanate], Trazodone, and Latex   Social History:  The patient  reports that she has never smoked. She has never used smokeless tobacco. She reports current alcohol use of about 2.0 standard drinks  of alcohol per week. She reports that she does not use drugs.   Family History:  The patient's family history includes Asthma in her mother and sister.   ROS:  Please see the history of present illness.   Otherwise, review of systems is positive for none.   All other systems are reviewed and negative.   PHYSICAL EXAM: VS:  BP (!) 124/54   Pulse 65   Ht 5' 9.5" (1.765 m)   Wt 178 lb 6.4 oz (80.9 kg)   SpO2 98%   BMI 25.97 kg/m  , BMI Body mass index is 25.97 kg/m. GEN: Well  nourished, well developed, in no acute distress  HEENT: normal  Neck: no JVD, carotid bruits, or masses Cardiac: RRR; no murmurs, rubs, or gallops,no edema  Respiratory:  clear to auscultation bilaterally, normal work of breathing GI: soft, nontender, nondistended, + BS MS: no deformity or atrophy  Skin: warm and dry Neuro:  Strength and sensation are intact Psych: euthymic mood, full affect  EKG:  EKG is ordered today. Personal review of the ekg ordered shows sinus rhythm rate 65  Recent Labs: 04/29/2020: BUN 18; Creatinine, Ser 1.05; Magnesium 2.1; Potassium 4.3; Sodium 140    Lipid Panel  No results found for: CHOL, TRIG, HDL, CHOLHDL, VLDL, LDLCALC, LDLDIRECT   Wt Readings from Last 3 Encounters:  05/27/20 178 lb 6.4 oz (80.9 kg)  05/12/20 175 lb 9.6 oz (79.7 kg)  04/29/20 176 lb 3.2 oz (79.9 kg)      Other studies Reviewed: Additional studies/ records that were reviewed today include: TEE 01/07/2020 Review of the above records today demonstrates:  The left ventricular size is normal. There is normal left ventricular wall thickness. LV ejection fraction = 60-65%. No segmental wall motion abnormalities seen in the left ventricle The left atrium is moderately dilated. A left-to-right shunt was visualized by color flow Doppler.   ASSESSMENT AND PLAN:  1.  Persistent atrial fibrillation/flutter: Has had ablation x2.  She is in and out of atrial fibrillation currently.  Currently on dofetilide and Xarelto.  CHA2DS2-VASc of 2.  She has had a decrease in her atrial fibrillation burden.  This has been since starting dofetilide.  When she is in atrial fibrillation, her rates are still quite fast.  We Eniola Cerullo give her 25 mg of metoprolol to take as needed.   2.  Hyperlipidemia: Continue atorvastatin   Current medicines are reviewed at length with the patient today.   The patient does not have concerns regarding her medicines.  The following changes were made today:  Metoprolol  Labs/ tests ordered today include:  Orders Placed This Encounter  Procedures  . EKG 12-Lead     Disposition:   FU with Emika Tiano 6 months  Signed, Colm Lyford Meredith Leeds, MD  05/27/2020 3:11 PM     [address] [place] [address] [phone] (office) [phone] (fax)

## 2020-06-04 MED ORDER — DILTIAZEM HCL ER COATED BEADS 240 MG PO CP24
240.0000 mg | ORAL_CAPSULE | Freq: Every day | ORAL | 2 refills | Status: DC
Start: 2020-06-04 — End: 2020-08-16

## 2020-06-04 NOTE — Telephone Encounter (Signed)
Pt aware of Dr. Elberta Fortis recommendation. Patient verbalized understanding and agreeable to plan.

## 2020-06-08 ENCOUNTER — Telehealth: Payer: Self-pay | Admitting: *Deleted

## 2020-06-08 NOTE — Telephone Encounter (Signed)
Patient with diagnosis of atrial fibrillation on Xarelto for anticoagulation.    Procedure: colonoscopy Date of procedure: TBD  CHADS2-VASc score of  2 (AGE, female)  CrCl 60 Platelet count 197  Per office protocol, patient can hold Xarelto for 2 days prior to procedure.

## 2020-06-08 NOTE — Telephone Encounter (Signed)
° °  [facility] Pre-operative Risk Assessment    HEARTCARE STAFF: - Please ensure there is not already an duplicate clearance open for this procedure. - Under Visit Info/Reason for Call, type in Other and utilize the format Clearance MM/DD/YY or Clearance TBD. Do not use dashes or single digits. - If request is for dental extraction, please clarify the # of teeth to be extracted.  Request for surgical clearance:  1. What type of surgery is being performed? COLONOSCOPY   2. When is this surgery scheduled? TBD   3. What type of clearance is required (medical clearance vs. Pharmacy clearance to hold med vs. Both)? BOTH  4. Are there any medications that need to be held prior to surgery and how long? XARELTO x 2 DAYS PRIOR   5. Practice name and name of physician performing surgery? [facility]; DR. SHEARIN   6. What is the office phone number? [phone]   7.   What is the office fax number? [phone]  8.   Anesthesia type (None, local, MAC, general) ? NOT LISTED   Julaine Hua 06/08/2020, 1:38 PM  _________________________________________________________________   (provider comments below)

## 2020-08-16 ENCOUNTER — Ambulatory Visit (HOSPITAL_COMMUNITY)
Admission: RE | Admit: 2020-08-16 | Discharge: 2020-08-16 | Disposition: A | Discharge status: Home / Self Care | Payer: Medicare PPO | Source: Ambulatory Visit | Attending: Physician Assistant | Admitting: Physician Assistant

## 2020-08-16 ENCOUNTER — Other Ambulatory Visit: Payer: Self-pay

## 2020-08-16 ENCOUNTER — Encounter (HOSPITAL_COMMUNITY): Payer: Self-pay | Admitting: Physician Assistant

## 2020-08-16 VITALS — BP 124/72 | HR 114 | Ht 69.5 in | Wt 180.6 lb

## 2020-08-16 DIAGNOSIS — Z7901 Long term (current) use of anticoagulants: Secondary | ICD-10-CM | POA: Diagnosis not present

## 2020-08-16 DIAGNOSIS — Z888 Allergy status to other drugs, medicaments and biological substances status: Secondary | ICD-10-CM | POA: Insufficient documentation

## 2020-08-16 DIAGNOSIS — Z79890 Hormone replacement therapy (postmenopausal): Secondary | ICD-10-CM | POA: Diagnosis not present

## 2020-08-16 DIAGNOSIS — I484 Atypical atrial flutter: Secondary | ICD-10-CM

## 2020-08-16 DIAGNOSIS — I4892 Unspecified atrial flutter: Secondary | ICD-10-CM | POA: Diagnosis not present

## 2020-08-16 DIAGNOSIS — E039 Hypothyroidism, unspecified: Secondary | ICD-10-CM | POA: Diagnosis not present

## 2020-08-16 DIAGNOSIS — I4819 Other persistent atrial fibrillation: Secondary | ICD-10-CM | POA: Diagnosis not present

## 2020-08-16 DIAGNOSIS — E785 Hyperlipidemia, unspecified: Secondary | ICD-10-CM | POA: Diagnosis not present

## 2020-08-16 DIAGNOSIS — Z79899 Other long term (current) drug therapy: Secondary | ICD-10-CM | POA: Insufficient documentation

## 2020-08-16 DIAGNOSIS — Z881 Allergy status to other antibiotic agents status: Secondary | ICD-10-CM | POA: Diagnosis not present

## 2020-08-16 MED ORDER — DILTIAZEM HCL ER COATED BEADS 360 MG PO CP24
360.0000 mg | ORAL_CAPSULE | Freq: Every day | ORAL | Status: DC
Start: 2020-08-16 — End: 2020-11-23

## 2020-08-16 NOTE — Progress Notes (Addendum)
Primary Care Physician: Rebecca Scarce, MD Primary Cardiologist: Dr Rudolpho Sevin Primary Electrophysiologist: Dr Elberta Fortis Referring Physician: Dr Dorena Cookey is a 74 y.o. female with a history of persistent atrial fibrillation, atrial flutter, HLD, hypothyroidism who presents for follow up in the [facility]. Patient is on Xarelto for a CHADS2VASC score of 2. She had an afib ablation in 2016 and an afib/flutter ablation by Dr Rudolpho Sevin on 12/29/19. Patient continued to have paroxysms of afib and presented to Dr Elberta Fortis for a second opinion on 04/01/20. Patient is s/p dofetilide admission 5/3-04/22/20. She did not require DCCV.   On follow up today, patient reports that ~2 weeks ago she noticed elevated heart rates associated with fatigue and dyspnea with exertion. There were no specific triggers that the patient could identify. She denies any missed doses of anticoagulation in the last 3 weeks.   Today, she denies symptoms of chest pain, orthopnea, PND, lower extremity edema, dizziness, presyncope, syncope, bleeding, or neurologic sequela. The patient is tolerating medications without difficulties and is otherwise without complaint today.    Atrial Fibrillation Risk Factors:  she does not have symptoms or diagnosis of sleep apnea. she does not have a history of rheumatic fever. she does not have a history of alcohol use.   she has a BMI of Body mass index is 26.29 kg/m.Marland Kitchen Filed Weights   08/16/20 1332  Weight: 81.9 kg    Family History  Problem Relation Age of Onset  . Asthma Mother   . Asthma Sister      Atrial Fibrillation Management history:  Previous antiarrhythmic drugs: Multaq, flecainide, sotalol, dofetilide  Previous cardioversions: none Previous ablations: 2016, 12/29/19 CHADS2VASC score: 2 Anticoagulation history: Xarelto    Past Medical History:  Diagnosis Date  . Allergic rhinitis    Past Surgical History:  Procedure Laterality  Date  . ADENOIDECTOMY    . APPENDECTOMY    . HERNIA REPAIR    . pulmonary vein abrasion    . SHOULDER ARTHROSCOPY    . TONSILLECTOMY      Current Outpatient Medications  Medication Sig Dispense Refill  . atorvastatin (LIPITOR) 20 MG tablet Take 20 mg by mouth every other day. Alternate days with Tricor 145mg     . azelastine (ASTELIN) 0.1 % nasal spray Place 2 sprays into both nostrils 2 (two) times daily. 30 mL 5  . Cholecalciferol (VITAMIN D3) 2000 units capsule Take 2,000 Units by mouth daily.     diltiazem (CARDIZEM CD) 360 MG 24 hr capsule Take 1 capsule (360 mg total) by mouth daily.    Marland Kitchen dofetilide (TIKOSYN) 125 MCG capsule Take 1 capsule (125 mcg total) by mouth 2 (two) times daily. 60 capsule 6  . fenofibrate (TRICOR) 145 MG tablet Take 145 mg by mouth every other day. Alternate with Lipitor    . fexofenadine (ALLEGRA) 180 MG tablet Take by mouth as needed.     Marland Kitchen levothyroxine (SYNTHROID, LEVOTHROID) 75 MCG tablet Take 75 mcg by mouth daily before breakfast.     . metoprolol tartrate (LOPRESSOR) 25 MG tablet Take 1 tablet (25 mg total) by mouth 2 (two) times daily as needed (for palpitations). 30 tablet 3  . pantoprazole (PROTONIX) 40 MG tablet Take 40 mg by mouth at bedtime.    . potassium chloride 20 MEQ TBCR Take 20 mEq by mouth daily. 30 tablet 6  . rivaroxaban (XARELTO) 20 MG TABS tablet Take 1 tablet (20 mg total) by mouth daily  with supper. 30 tablet 6   No current facility-administered medications for this encounter.    Allergies  Allergen Reactions  . Sulfa Antibiotics Rash    rash  . Augmentin [Amoxicillin-Pot Clavulanate] Other (See Comments)    Stomach issues  . Trazodone     nightmares  . Latex Rash    Other reaction(s): RASH     Social History   Socioeconomic History  . Marital status: Married    Spouse name: Not on file  . Number of children: Not on file  . Years of education: Not on file  . Highest education level: Not on file  Occupational  History  . Not on file  Tobacco Use  . Smoking status: Never Smoker  . Smokeless tobacco: Never Used  Vaping Use  . Vaping Use: Never used  Substance and Sexual Activity  . Alcohol use: Yes    Alcohol/week: 2.0 standard drinks    Types: 1 Glasses of wine, 1 Cans of beer per week  . Drug use: No  . Sexual activity: Not on file  Other Topics Concern  . Not on file  Social History Narrative  . Not on file   Social Determinants of Health   Financial Resource Strain:   . Difficulty of Paying Living Expenses: Not on file  Food Insecurity:   . Worried About Programme researcher, broadcasting/film/video in the Last Year: Not on file  . Ran Out of Food in the Last Year: Not on file  Transportation Needs:   . Lack of Transportation (Medical): Not on file  . Lack of Transportation (Non-Medical): Not on file  Physical Activity:   . Days of Exercise per Week: Not on file  . Minutes of Exercise per Session: Not on file  Stress:   . Feeling of Stress : Not on file  Social Connections:   . Frequency of Communication with Friends and Family: Not on file  . Frequency of Social Gatherings with Friends and Family: Not on file  . Attends Religious Services: Not on file  . Active Member of Clubs or Organizations: Not on file  . Attends Banker Meetings: Not on file  . Marital Status: Not on file  Intimate Partner Violence:   . Fear of Current or Ex-Partner: Not on file  . Emotionally Abused: Not on file  . Physically Abused: Not on file  . Sexually Abused: Not on file     ROS- All systems are reviewed and negative except as per the HPI above.  Physical Exam: Vitals:   08/16/20 1332  BP: 124/72  Pulse: (!) 114  Weight: 81.9 kg  Height: 5' 9.5" (1.765 m)    GEN- The patient is well appearing elderly female, alert and oriented x 3 today.   HEENT-head normocephalic, atraumatic, sclera clear, conjunctiva pink, hearing intact, trachea midline. Lungs- Clear to ausculation bilaterally, normal work  of breathing Heart- irregular rate and rhythm, no murmurs, rubs or gallops  GI- soft, NT, ND, + BS Extremities- no clubbing, cyanosis, or edema MS- no significant deformity or atrophy Skin- no rash or lesion Psych- euthymic mood, full affect Neuro- strength and sensation are intact   Wt Readings from Last 3 Encounters:  08/16/20 81.9 kg  05/27/20 80.9 kg  05/12/20 79.7 kg    EKG today demonstrates atypical atrial flutter HR 114, QRS 86, QTc 482  TEE 01/07/20 demonstrated  Review of the above records today demonstrates:  The left ventricular size is normal. There is normal left  ventricular wall thickness. LV ejection fraction = 60-65%. No segmental wall motion abnormalities seen in the left ventricle The left atrium is moderately dilated. A left-to-right shunt was visualized by color flow Doppler.  Epic records are reviewed at length today  CHA2DS2-VASc Score = 2  The patient's score is based upon: CHF History: 0 HTN History: 0 Age : 1 Diabetes History: 0 Stroke History: 0 Vascular Disease History: 0 Gender: 1     ASSESSMENT AND PLAN: 1. Persistent Atrial Fibrillation/atrial flutter The patient's CHA2DS2-VASc score is 2, indicating a 2.2% annual risk of stroke.   S/p afib ablation x2 and atrial flutter ablation. Loaded on dofetilide 5/3-04/22/20. We discussed therapeutic options today including DCCV. Will arrange for DCCV. Increase diltiazem to 360 mg daily until DCCV, then reduce back to 240 mg daily. Continue dofetilide 125 mcg BID.  Continue Xarelto 20 mg daily. Patient denies any missed doses in the last 3 weeks.  Recent lab work in [facility] reviewed.    Follow up in the AF clinic one week post DCCV.    Addendum: Patient called clinic to report that she had been off Xarelto for a colonoscopy but she resumed it on 07/29/20. She will have been on anticoagulation for 3 weeks by her scheduled DCCV. No need to reschedule.   Jorja Loa PA-C Afib  Clinic [facility] [address] [place], [place] [address] [phone] 08/16/2020 2:42 PM

## 2020-08-16 NOTE — Patient Instructions (Signed)
Cardioversion scheduled for Friday, September 3rd  - Arrive at the [facility] and go to admitting at 930AM  - Do not eat or drink anything after midnight the night prior to your procedure.  - Take all your morning medication (except diabetic medications) with a sip of water prior to arrival.  - You will not be able to drive home after your procedure.  - Do NOT miss any doses of your blood thinner - if you should miss a dose please notify our office immediately.    Increase cardizem to 360mg  once a day until day of cardioversion then reduce back to 240mg  once a day

## 2020-08-17 ENCOUNTER — Other Ambulatory Visit (HOSPITAL_COMMUNITY)
Admission: RE | Admit: 2020-08-17 | Discharge: 2020-08-17 | Disposition: A | Discharge status: Home / Self Care | Payer: Medicare PPO | Source: Ambulatory Visit | Attending: Cardiovascular Disease | Admitting: Cardiovascular Disease

## 2020-08-17 DIAGNOSIS — Z01812 Encounter for preprocedural laboratory examination: Secondary | ICD-10-CM | POA: Diagnosis present

## 2020-08-17 DIAGNOSIS — Z20822 Contact with and (suspected) exposure to covid-19: Secondary | ICD-10-CM | POA: Diagnosis not present

## 2020-08-17 LAB — SARS CORONAVIRUS 2 (TAT 6-24 HRS): SARS Coronavirus 2: NEGATIVE

## 2020-08-17 NOTE — Addendum Note (Signed)
Encounter addended by: Danice Goltz, PA on: 08/17/2020 9:16 AM  Actions taken: Clinical Note Signed

## 2020-08-18 ENCOUNTER — Telehealth (HOSPITAL_COMMUNITY): Payer: Self-pay | Admitting: *Deleted

## 2020-08-18 ENCOUNTER — Encounter (HOSPITAL_COMMUNITY): Payer: Self-pay

## 2020-08-18 NOTE — Telephone Encounter (Signed)
Pt called stating she is going in and out of afib now. HR in the 60s currently. She captured EKG in normal rhythm and emailed copy - Jorja Loa PA confirmed NSR 63 - will cancel cardioversion. Will keep pt on higher dose of cardizem 360mg  a day and keep scheduled follow up for recheck. Pt in agreement.

## 2020-08-20 ENCOUNTER — Ambulatory Visit (HOSPITAL_COMMUNITY): Admission: RE | Admit: 2020-08-20 | Payer: Medicare PPO | Source: Home / Self Care | Admitting: Cardiovascular Disease

## 2020-08-20 ENCOUNTER — Encounter (HOSPITAL_COMMUNITY): Admission: RE | Payer: Self-pay | Source: Home / Self Care

## 2020-08-20 SURGERY — CARDIOVERSION
Anesthesia: General

## 2020-08-27 ENCOUNTER — Encounter (HOSPITAL_COMMUNITY): Payer: Self-pay | Admitting: Physician Assistant

## 2020-08-27 ENCOUNTER — Other Ambulatory Visit: Payer: Self-pay

## 2020-08-27 ENCOUNTER — Ambulatory Visit (HOSPITAL_COMMUNITY)
Admission: RE | Admit: 2020-08-27 | Discharge: 2020-08-27 | Disposition: A | Discharge status: Home / Self Care | Payer: Medicare PPO | Source: Ambulatory Visit | Attending: Physician Assistant | Admitting: Physician Assistant

## 2020-08-27 VITALS — BP 124/56 | HR 63 | Ht 69.5 in | Wt 181.6 lb

## 2020-08-27 DIAGNOSIS — E785 Hyperlipidemia, unspecified: Secondary | ICD-10-CM | POA: Insufficient documentation

## 2020-08-27 DIAGNOSIS — Z79890 Hormone replacement therapy (postmenopausal): Secondary | ICD-10-CM | POA: Insufficient documentation

## 2020-08-27 DIAGNOSIS — I4892 Unspecified atrial flutter: Secondary | ICD-10-CM | POA: Diagnosis not present

## 2020-08-27 DIAGNOSIS — I4819 Other persistent atrial fibrillation: Secondary | ICD-10-CM

## 2020-08-27 DIAGNOSIS — Z79899 Other long term (current) drug therapy: Secondary | ICD-10-CM | POA: Diagnosis not present

## 2020-08-27 DIAGNOSIS — Z7901 Long term (current) use of anticoagulants: Secondary | ICD-10-CM | POA: Diagnosis not present

## 2020-08-27 DIAGNOSIS — I440 Atrioventricular block, first degree: Secondary | ICD-10-CM | POA: Diagnosis not present

## 2020-08-27 DIAGNOSIS — E039 Hypothyroidism, unspecified: Secondary | ICD-10-CM | POA: Insufficient documentation

## 2020-08-27 NOTE — Progress Notes (Signed)
Primary Care Physician: Gillian Scarce, MD Primary Cardiologist: Dr Rudolpho Sevin Primary Electrophysiologist: Dr Elberta Fortis Referring Physician: Dr Dorena Cookey is a 74 y.o. female with a history of persistent atrial fibrillation, atrial flutter, HLD, hypothyroidism who presents for follow up in the [facility]. Patient is on Xarelto for a CHADS2VASC score of 2. She had an afib ablation in 2016 and an afib/flutter ablation by Dr Rudolpho Sevin on 12/29/19. Patient continued to have paroxysms of afib and presented to Dr Elberta Fortis for a second opinion on 04/01/20. Patient is s/p dofetilide admission 5/3-04/22/20. She did not require DCCV. After admission, she again developed persistent afib. Her diltiazem was increased and DCCV was arranged.  On follow up today, DCCV was cancelled as patient was going in and out of SR, confirmed by [place], personally reviewed. Patient reports today that she has not had any afib since Monday of this week. She denies any bleeding issues on anticoagulation.   Today, she denies symptoms of palpitations, chest pain, orthopnea, PND, lower extremity edema, dizziness, presyncope, syncope, bleeding, or neurologic sequela. The patient is tolerating medications without difficulties and is otherwise without complaint today.    Atrial Fibrillation Risk Factors:  she does not have symptoms or diagnosis of sleep apnea. she does not have a history of rheumatic fever. she does not have a history of alcohol use.   she has a BMI of Body mass index is 26.43 kg/m.Marland Kitchen Filed Weights   08/27/20 0931  Weight: 82.4 kg    Family History  Problem Relation Age of Onset  . Asthma Mother   . Asthma Sister      Atrial Fibrillation Management history:  Previous antiarrhythmic drugs: Multaq, flecainide, sotalol, dofetilide  Previous cardioversions: none Previous ablations: 2016, 12/29/19 CHADS2VASC score: 2 Anticoagulation history: Xarelto    Past Medical  History:  Diagnosis Date  . Allergic rhinitis    Past Surgical History:  Procedure Laterality Date  . ADENOIDECTOMY    . APPENDECTOMY    . HERNIA REPAIR    . pulmonary vein abrasion    . SHOULDER ARTHROSCOPY    . TONSILLECTOMY      Current Outpatient Medications  Medication Sig Dispense Refill  . atorvastatin (LIPITOR) 20 MG tablet Take 20 mg by mouth every other day. Alternate days with Tricor 145mg     . azelastine (ASTELIN) 0.1 % nasal spray Place 2 sprays into both nostrils 2 (two) times daily. (Patient taking differently: Place 2 sprays into both nostrils daily as needed. ) 30 mL 5  . Cholecalciferol (VITAMIN D3) 2000 units capsule Take 2,000 Units by mouth daily.     diltiazem (CARDIZEM CD) 360 MG 24 hr capsule Take 1 capsule (360 mg total) by mouth daily. (Patient taking differently: Take 360 mg by mouth at bedtime. )    . dofetilide (TIKOSYN) 125 MCG capsule Take 1 capsule (125 mcg total) by mouth 2 (two) times daily. 60 capsule 6  . fenofibrate (TRICOR) 145 MG tablet Take 145 mg by mouth every other day. Alternate with Lipitor    . fexofenadine (ALLEGRA) 180 MG tablet Take 180 mg by mouth daily as needed for allergies.     Marland Kitchen levothyroxine (SYNTHROID, LEVOTHROID) 75 MCG tablet Take 75 mcg by mouth daily before breakfast.     . pantoprazole (PROTONIX) 40 MG tablet Take 40 mg by mouth at bedtime.    . potassium chloride 20 MEQ TBCR Take 20 mEq by mouth daily. 30 tablet 6  .  rivaroxaban (XARELTO) 20 MG TABS tablet Take 1 tablet (20 mg total) by mouth daily with supper. (Patient taking differently: Take 20 mg by mouth at bedtime. ) 30 tablet 6  . metoprolol tartrate (LOPRESSOR) 25 MG tablet Take 1 tablet (25 mg total) by mouth 2 (two) times daily as needed (for palpitations). (Patient not taking: Reported on 08/17/2020) 30 tablet 3   No current facility-administered medications for this encounter.    Allergies  Allergen Reactions  . Sulfa Antibiotics Rash    rash  . Augmentin  [Amoxicillin-Pot Clavulanate] Other (See Comments)    Stomach issues  . Trazodone     nightmares  . Latex Rash    Other reaction(s): RASH     Social History   Socioeconomic History  . Marital status: Married    Spouse name: Not on file  . Number of children: Not on file  . Years of education: Not on file  . Highest education level: Not on file  Occupational History  . Not on file  Tobacco Use  . Smoking status: Never Smoker  . Smokeless tobacco: Never Used  Vaping Use  . Vaping Use: Never used  Substance and Sexual Activity  . Alcohol use: Yes    Alcohol/week: 2.0 standard drinks    Types: 1 Glasses of wine, 1 Cans of beer per week  . Drug use: No  . Sexual activity: Not on file  Other Topics Concern  . Not on file  Social History Narrative  . Not on file   Social Determinants of Health   Financial Resource Strain:   . Difficulty of Paying Living Expenses: Not on file  Food Insecurity:   . Worried About Programme researcher, broadcasting/film/video in the Last Year: Not on file  . Ran Out of Food in the Last Year: Not on file  Transportation Needs:   . Lack of Transportation (Medical): Not on file  . Lack of Transportation (Non-Medical): Not on file  Physical Activity:   . Days of Exercise per Week: Not on file  . Minutes of Exercise per Session: Not on file  Stress:   . Feeling of Stress : Not on file  Social Connections:   . Frequency of Communication with Friends and Family: Not on file  . Frequency of Social Gatherings with Friends and Family: Not on file  . Attends Religious Services: Not on file  . Active Member of Clubs or Organizations: Not on file  . Attends Banker Meetings: Not on file  . Marital Status: Not on file  Intimate Partner Violence:   . Fear of Current or Ex-Partner: Not on file  . Emotionally Abused: Not on file  . Physically Abused: Not on file  . Sexually Abused: Not on file     ROS- All systems are reviewed and negative except as per the  HPI above.  Physical Exam: Vitals:   08/27/20 0931  BP: (!) 124/56  Pulse: 63  Weight: 82.4 kg  Height: 5' 9.5" (1.765 m)    GEN- The patient is well appearing elderly female, alert and oriented x 3 today.   HEENT-head normocephalic, atraumatic, sclera clear, conjunctiva pink, hearing intact, trachea midline. Lungs- Clear to ausculation bilaterally, normal work of breathing Heart- Regular rate and rhythm, no murmurs, rubs or gallops  GI- soft, NT, ND, + BS Extremities- no clubbing, cyanosis, or edema MS- no significant deformity or atrophy Skin- no rash or lesion Psych- euthymic mood, full affect Neuro- strength and sensation are  intact   Wt Readings from Last 3 Encounters:  08/27/20 82.4 kg  08/16/20 81.9 kg  05/27/20 80.9 kg    EKG today demonstrates SR HR 63, 1st degree AV block, NST, PR 224, QRS 84, QTc manually calculated 451 ms  TEE 01/07/20 demonstrated  Review of the above records today demonstrates:  The left ventricular size is normal. There is normal left ventricular wall thickness. LV ejection fraction = 60-65%. No segmental wall motion abnormalities seen in the left ventricle The left atrium is moderately dilated. A left-to-right shunt was visualized by color flow Doppler.  Epic records are reviewed at length today  CHA2DS2-VASc Score = 2  The patient's score is based upon: CHF History: 0 HTN History: 0 Age : 1 Diabetes History: 0 Stroke History: 0 Vascular Disease History: 0 Gender: 1     ASSESSMENT AND PLAN: 1. Persistent Atrial Fibrillation/atrial flutter The patient's CHA2DS2-VASc score is 2, indicating a 2.2% annual risk of stroke.   S/p afib ablation x2 and atrial flutter ablation. Loaded on dofetilide 5/3-04/22/20. Her afib appears to be quiet at the moment. Will continue dofetilide for now. If she fails dofetilide, may require change to amiodarone.  Continue diltiazem 360 mg daily until DCCV Continue dofetilide 125 mcg BID. QT stable by  manual calculation, reviewed with Dr Elberta Fortis. Continue Xarelto 20 mg daily.    Follow up with Dr Elberta Fortis as scheduled.    Jorja Loa PA-C [facility] [address] [place], [place] [address] [phone] 08/27/2020 10:02 AM

## 2020-11-07 ENCOUNTER — Other Ambulatory Visit: Payer: Self-pay | Admitting: Physician Assistant

## 2020-11-08 NOTE — Telephone Encounter (Signed)
rx refill

## 2020-11-23 ENCOUNTER — Ambulatory Visit (INDEPENDENT_AMBULATORY_CARE_PROVIDER_SITE_OTHER): Payer: Medicare PPO | Admitting: Cardiology

## 2020-11-23 ENCOUNTER — Encounter: Payer: Self-pay | Admitting: Cardiology

## 2020-11-23 ENCOUNTER — Other Ambulatory Visit: Payer: Self-pay

## 2020-11-23 VITALS — BP 140/64 | HR 63 | Ht 70.0 in | Wt 182.6 lb

## 2020-11-23 DIAGNOSIS — I4819 Other persistent atrial fibrillation: Secondary | ICD-10-CM

## 2020-11-23 DIAGNOSIS — Z79899 Other long term (current) drug therapy: Secondary | ICD-10-CM

## 2020-11-23 MED ORDER — RIVAROXABAN 20 MG PO TABS: 20.0000 mg | ORAL_TABLET | Freq: Every day | ORAL | 1 refills | Status: DC

## 2020-11-23 MED ORDER — DILTIAZEM HCL ER COATED BEADS 360 MG PO CP24: 360.0000 mg | ORAL_CAPSULE | Freq: Every day | ORAL | 1 refills | Status: DC

## 2020-11-23 NOTE — Patient Instructions (Addendum)
Medication Instructions:  Your physician recommends that you continue on your current medications as directed. Please refer to the Current Medication list given to you today.  *If you need a refill on your cardiac medications before your next appointment, please call your pharmacy*   Lab Work: Today: BMET & Magnesium    Testing/Procedures: None ordered   Follow-Up: At [facility], you and your health needs are our priority.  As part of our continuing mission to provide you with exceptional heart care, we have created designated Provider Care Teams.  These Care Teams include your primary Cardiologist (physician) and Advanced Practice Providers (APPs -  Physician Assistants and Nurse Practitioners) who all work together to provide you with the care you need, when you need it.  Your next appointment:   6 month(s)  The format for your next appointment:   In Person  Provider:   You may see Will Jorja Loa, MD or one of the following Advanced Practice Providers on your designated Care Team:    Gypsy Balsam, NP  Francis Dowse, PA-C  Casimiro Needle "[place]" [place], [place]     Thank you for choosing [facility]!!   Dory Horn, RN [phone]

## 2020-11-23 NOTE — Progress Notes (Signed)
Electrophysiology Office Note   Date:  11/23/2020   ID:  Nevaeha Finerty, DOB [birth year], MRN [id number]  PCP:  Gillian Scarce, MD  Cardiologist: Rudolpho Sevin Primary Electrophysiologist:  Melo Stauber Jorja Loa, MD    Chief Complaint: Second opinion   History of Present Illness: Rebecca Carter is a 74 y.o. female who is being seen today for the evaluation of atrial fibrillation at the request of Zanard, Hinton Dyer, MD. Presenting today for electrophysiology evaluation.  She has a history significant for atrial fibrillation and atrial flutter.  She is status post ablation on 12/29/2019 by Dr. Rudolpho Sevin..  She also has hypothyroidism and hyperlipidemia.  During that ablation, she had a repeat PVI.  She went into an atypical atrial flutter and had ablation from the right superior pulmonary vein to the mitral valve which change the activation to a roof dependent flutter.  Ablation was performed from the right superior to the left superior pulmonary veins which resulted in termination of tachycardia.  She also had a cavotricuspid isthmus ablation.  She is now status post admission for dofetilide load.  Today, denies symptoms of palpitations, chest pain, shortness of breath, orthopnea, PND, lower extremity edema, claudication, dizziness, presyncope, syncope, bleeding, or neurologic sequela. The patient is tolerating medications without difficulties.  Since last being seen she has done well.  Over the summer, she had more frequent episodes of atrial fibrillation.  Her diltiazem was increased which improved her symptoms.  She continues to have breakthrough episodes, approximately once a week.  Her episodes are all short-lived.  She is overall comfortable with her control.   Past Medical History:  Diagnosis Date  . Allergic rhinitis    Past Surgical History:  Procedure Laterality Date  . ADENOIDECTOMY    . APPENDECTOMY    . HERNIA REPAIR    . pulmonary vein abrasion    . SHOULDER ARTHROSCOPY    .  TONSILLECTOMY       Current Outpatient Medications  Medication Sig Dispense Refill  . atorvastatin (LIPITOR) 20 MG tablet Take 20 mg by mouth every other day. Alternate days with Tricor 145mg     . azelastine (ASTELIN) 0.1 % nasal spray Place 2 sprays into both nostrils 2 (two) times daily. (Patient taking differently: Place 2 sprays into both nostrils daily as needed. ) 30 mL 5  . Cholecalciferol (VITAMIN D3) 2000 units capsule Take 2,000 Units by mouth daily.     diltiazem (CARDIZEM CD) 360 MG 24 hr capsule Take 1 capsule (360 mg total) by mouth at bedtime. 90 capsule 1  . dofetilide (TIKOSYN) 125 MCG capsule TAKE ONE CAPSULE BY MOUTH TWICE A DAY 60 capsule 6  . fexofenadine (ALLEGRA) 180 MG tablet Take 180 mg by mouth daily as needed for allergies.     . meloxicam (MOBIC) 7.5 MG tablet Take 1 tablet by mouth daily.    . metoprolol tartrate (LOPRESSOR) 25 MG tablet Take 1 tablet (25 mg total) by mouth 2 (two) times daily as needed (for palpitations). 30 tablet 3  . pantoprazole (PROTONIX) 40 MG tablet Take 40 mg by mouth at bedtime.    . Potassium Chloride ER 20 MEQ TBCR TAKE ONE TABLET BY MOUTH DAILY 30 tablet 6  . rivaroxaban (XARELTO) 20 MG TABS tablet Take 1 tablet (20 mg total) by mouth daily with supper. 90 tablet 1  . fenofibrate (TRICOR) 145 MG tablet Take 145 mg by mouth every other day. Alternate with Lipitor    . levothyroxine (SYNTHROID, LEVOTHROID) 75 MCG  tablet Take 75 mcg by mouth daily before breakfast.      No current facility-administered medications for this visit.    Allergies:   Sulfa antibiotics, Augmentin [amoxicillin-pot clavulanate], Trazodone, and Latex   Social History:  The patient  reports that she has never smoked. She has never used smokeless tobacco. She reports current alcohol use of about 2.0 standard drinks of alcohol per week. She reports that she does not use drugs.   Family History:  The patient's family history includes Asthma in her mother and  sister.   ROS:  Please see the history of present illness.   Otherwise, review of systems is positive for none.   All other systems are reviewed and negative.   PHYSICAL EXAM: VS:  BP 140/64   Pulse 63   Ht 5\' 10"  (1.778 m)   Wt 182 lb 9.6 oz (82.8 kg)   SpO2 99%   BMI 26.20 kg/m  , BMI Body mass index is 26.2 kg/m. GEN: Well nourished, well developed, in no acute distress  HEENT: normal  Neck: no JVD, carotid bruits, or masses Cardiac: RRR; no murmurs, rubs, or gallops,no edema  Respiratory:  clear to auscultation bilaterally, normal work of breathing GI: soft, nontender, nondistended, + BS MS: no deformity or atrophy  Skin: warm and dry Neuro:  Strength and sensation are intact Psych: euthymic mood, full affect  EKG:  EKG is ordered today. Personal review of the ekg ordered shows sinus rhythm, rate 64   Recent Labs: 04/29/2020: BUN 18; Creatinine, Ser 1.05; Magnesium 2.1; Potassium 4.3; Sodium 140    Lipid Panel  No results found for: CHOL, TRIG, HDL, CHOLHDL, VLDL, LDLCALC, LDLDIRECT   Wt Readings from Last 3 Encounters:  11/23/20 182 lb 9.6 oz (82.8 kg)  08/27/20 181 lb 9.6 oz (82.4 kg)  08/16/20 180 lb 9.6 oz (81.9 kg)      Other studies Reviewed: Additional studies/ records that were reviewed today include: TEE 01/07/2020 Review of the above records today demonstrates:  The left ventricular size is normal. There is normal left ventricular wall thickness. LV ejection fraction = 60-65%. No segmental wall motion abnormalities seen in the left ventricle The left atrium is moderately dilated. A left-to-right shunt was visualized by color flow Doppler.   ASSESSMENT AND PLAN:  1.  Persistent atrial fibrillation/flutter: Status post ablation x2.  CHA2DS2-VASc of 2.  On dofetilide (high risk medication monitoring) and Xarelto.  She is currently in sinus rhythm and has weekly episodes of atrial fibrillation that are short-lived.  She is comfortable with her overall  control which is improved since increasing her diltiazem.  No changes.   2.  Hyperlipidemia: Continue atorvastatin   Current medicines are reviewed at length with the patient today.   The patient does not have concerns regarding her medicines.  The following changes were made today: None  Labs/ tests ordered today include:  Orders Placed This Encounter  Procedures  . Basic metabolic panel  . Magnesium  . EKG 12-Lead     Disposition:   FU with [facility] [place] 6 months  Signed, Raeshaun Simson 01/09/2020, MD  11/23/2020 2:05 PM     [facility] [address] [place] [phone] (office) [phone] (fax)

## 2020-11-24 LAB — BASIC METABOLIC PANEL
BUN/Creatinine Ratio: 15 (ref 12–28)
BUN: 17 mg/dL (ref 8–27)
CO2: 24 mmol/L (ref 20–29)
Calcium: 9.4 mg/dL (ref 8.7–10.3)
Chloride: 107 mmol/L — ABNORMAL HIGH (ref 96–106)
Creatinine, Ser: 1.12 mg/dL — ABNORMAL HIGH (ref 0.57–1.00)
GFR calc Af Amer: 56 mL/min/{1.73_m2} — ABNORMAL LOW (ref >59)
GFR calc non Af Amer: 49 mL/min/{1.73_m2} — ABNORMAL LOW (ref >59)
Glucose: 97 mg/dL (ref 65–99)
Potassium: 4.4 mmol/L (ref 3.5–5.2)
Sodium: 146 mmol/L — ABNORMAL HIGH (ref 134–144)

## 2020-11-24 LAB — MAGNESIUM: Magnesium: 2.1 mg/dL (ref 1.6–2.3)

## 2020-11-26 ENCOUNTER — Other Ambulatory Visit: Payer: Self-pay

## 2020-11-26 ENCOUNTER — Other Ambulatory Visit (HOSPITAL_COMMUNITY): Payer: Self-pay | Admitting: Physician Assistant

## 2020-11-26 MED ORDER — DILTIAZEM HCL ER COATED BEADS 360 MG PO CP24: 360.0000 mg | ORAL_CAPSULE | Freq: Every day | ORAL | 3 refills | Status: DC

## 2020-11-26 NOTE — Telephone Encounter (Signed)
Follow up:     Patient calling to see why her diltiazem did go threw to the pharmacy. Please call patient.

## 2021-05-09 ENCOUNTER — Other Ambulatory Visit: Payer: Self-pay | Admitting: *Deleted

## 2021-05-09 MED ORDER — RIVAROXABAN 20 MG PO TABS: 20.0000 mg | ORAL_TABLET | Freq: Every day | ORAL | 1 refills | Status: DC

## 2021-05-09 NOTE — Telephone Encounter (Signed)
Xarelto 20mg  refill request received. Pt is 75 years old, weight-82.8kg, Crea-1.12 on 11/23/2020, last seen by Dr. 14/06/2020 on 11/23/2020, Diagnosis-afib, CrCl-57.34ml/min; Dose is appropriate based on dosing criteria. Will send in refill to requested pharmacy.

## 2021-05-10 ENCOUNTER — Other Ambulatory Visit: Payer: Self-pay | Admitting: Physician Assistant

## 2021-05-30 ENCOUNTER — Ambulatory Visit (INDEPENDENT_AMBULATORY_CARE_PROVIDER_SITE_OTHER): Payer: Medicare PPO | Admitting: Cardiology

## 2021-05-30 ENCOUNTER — Other Ambulatory Visit: Payer: Self-pay

## 2021-05-30 ENCOUNTER — Encounter: Payer: Self-pay | Admitting: Cardiology

## 2021-05-30 VITALS — BP 122/52 | HR 69 | Ht 69.5 in | Wt 184.0 lb

## 2021-05-30 DIAGNOSIS — Z79899 Other long term (current) drug therapy: Secondary | ICD-10-CM

## 2021-05-30 DIAGNOSIS — I4819 Other persistent atrial fibrillation: Secondary | ICD-10-CM | POA: Diagnosis not present

## 2021-05-30 MED ORDER — FUROSEMIDE 20 MG PO TABS: 20.0000 mg | ORAL_TABLET | Freq: Every day | ORAL | 3 refills | Status: DC | PRN

## 2021-05-30 NOTE — Patient Instructions (Addendum)
Medication Instructions:  Your physician has recommended you make the following change in your medication:  TAKE Lasix 20 mg once daily AS NEEDED for swelling  *If you need a refill on your cardiac medications before your next appointment, please call your pharmacy*   Lab Work: Tikosyn & Xarelto surveillance lab work today: BMET, CBC & Magnesium  If you have labs (blood work) drawn today and your tests are completely normal, you will receive your results only by: [employer] (if you have MyChart) OR A paper copy in the mail If you have any lab test that is abnormal or we need to change your treatment, we will call you to review the results.   Testing/Procedures: None ordered   Follow-Up: At [facility], you and your health needs are our priority.  As part of our continuing mission to provide you with exceptional heart care, we have created designated Provider Care Teams.  These Care Teams include your primary Cardiologist (physician) and Advanced Practice Providers (APPs -  Physician Assistants and Nurse Practitioners) who all work together to provide you with the care you need, when you need it.  Your next appointment:   6 month(s)  The format for your next appointment:   In Person  Provider:   Loman Brooklyn, MD    Thank you for choosing [facility]!!   Dory Horn, RN [phone]

## 2021-05-30 NOTE — Progress Notes (Signed)
Electrophysiology Office Note   Date:  05/30/2021   ID:  Rebecca Carter, DOB October 01, 1946, MRN [id number]  PCP:  Rebecca Scarce, MD  Cardiologist: Rebecca Carter Primary Electrophysiologist:  Rebecca Eliot Jorja Loa, MD    Chief Complaint: Second opinion   History of Present Illness: Rebecca Carter is a 75 y.o. female who is being seen today for the evaluation of atrial fibrillation at the request of Rebecca Carter, Rebecca Dyer, MD. Presenting today for electrophysiology evaluation.  She has a history significant for atrial fibrillation and atrial flutter.  She is status post ablation 12/29/2019 by Dr. Gery Carter.  She also has hypothyroidism and hyperlipidemia ablation she had a repeat PVI.  She went into atypical atrial flutter and had ablation from the right superior pulmonary vein to the mitral valve with change in activation due to a repeat dependent flutter.  Ablation was performed from the right superior pulmonary vein to the left superior pulmonary vein which resulted in termination of tachycardia.  She also had a cavotricuspid isthmus ablation.  She is now status post admission for dofetilide  Today, denies symptoms of palpitations, chest pain, shortness of breath, orthopnea, PND, claudication, dizziness, presyncope, syncope, bleeding, or neurologic sequela. The patient is tolerating medications without difficulties.  Since being seen she has done well.  She has no chest pain or shortness of breath.  She is a do all of her daily activities.  She does note further episodes of atrial fibrillation.  She does state that she wakes up at night and at times her heart rates are in the 40s which is concerning.  She has also noted some intermittent lower extremity edema.  She tries to keep a low-salt diet.  She continues to get edema.   Past Medical History:  Diagnosis Date   Allergic rhinitis    Past Surgical History:  Procedure Laterality Date   ADENOIDECTOMY     APPENDECTOMY     HERNIA REPAIR     pulmonary vein  abrasion     SHOULDER ARTHROSCOPY     TONSILLECTOMY       Current Outpatient Medications  Medication Sig Dispense Refill   atorvastatin (LIPITOR) 20 MG tablet Take 20 mg by mouth every other day. Alternate days with Tricor 145mg      azelastine (ASTELIN) 0.1 % nasal spray Place 2 sprays into both nostrils 2 (two) times daily. (Patient taking differently: Place 2 sprays into both nostrils daily as needed. ) 30 mL 5   Cholecalciferol (VITAMIN D3) 2000 units capsule Take 2,000 Units by mouth daily.      diltiazem (CARDIZEM CD) 360 MG 24 hr capsule Take 1 capsule (360 mg total) by mouth at bedtime. 90 capsule 3   dofetilide (TIKOSYN) 125 MCG capsule TAKE ONE CAPSULE BY MOUTH TWICE A DAY 180 capsule 1   fenofibrate (TRICOR) 145 MG tablet Take 145 mg by mouth every other day. Alternate with Lipitor     fexofenadine (ALLEGRA) 180 MG tablet Take 180 mg by mouth daily as needed for allergies.      levothyroxine (SYNTHROID, LEVOTHROID) 75 MCG tablet Take 75 mcg by mouth daily before breakfast.      meloxicam (MOBIC) 7.5 MG tablet Take 1 tablet by mouth daily.     metoprolol tartrate (LOPRESSOR) 25 MG tablet Take 1 tablet (25 mg total) by mouth 2 (two) times daily as needed (for palpitations). 30 tablet 3   pantoprazole (PROTONIX) 40 MG tablet Take 40 mg by mouth at bedtime.     Potassium Chloride  ER 20 MEQ TBCR TAKE ONE TABLET BY MOUTH DAILY 30 tablet 6   rivaroxaban (XARELTO) 20 MG TABS tablet Take 1 tablet (20 mg total) by mouth daily with supper. 90 tablet 1   No current facility-administered medications for this visit.    Allergies:   Sulfa antibiotics, Augmentin [amoxicillin-pot clavulanate], Trazodone, and Latex   Social History:  The patient  reports that she has never smoked. She has never used smokeless tobacco. She reports current alcohol use of about 2.0 standard drinks of alcohol per week. She reports that she does not use drugs.   Family History:  The patient's family history includes  Asthma in her mother and sister.   ROS:  Please see the history of present illness.   Otherwise, review of systems is positive for none.   All other systems are reviewed and negative.   PHYSICAL EXAM: VS:  There were no vitals taken for this visit. , BMI There is no height or weight on file to calculate BMI. GEN: Well nourished, well developed, in no acute distress  HEENT: normal  Neck: no JVD, carotid bruits, or masses Cardiac: RRR; no murmurs, rubs, or gallops, 2+ edema  Respiratory:  clear to auscultation bilaterally, normal work of breathing GI: soft, nontender, nondistended, + BS MS: no deformity or atrophy  Skin: warm and dry Neuro:  Strength and sensation are intact Psych: euthymic mood, full affect  EKG:  EKG is ordered today. Personal review of the ekg ordered shows sinus rhythm, rate 69  Recent Labs: 11/23/2020: BUN 17; Creatinine, Ser 1.12; Magnesium 2.1; Potassium 4.4; Sodium 146    Lipid Panel  No results found for: CHOL, TRIG, HDL, CHOLHDL, VLDL, LDLCALC, LDLDIRECT   Wt Readings from Last 3 Encounters:  11/23/20 182 lb 9.6 oz (82.8 kg)  08/27/20 181 lb 9.6 oz (82.4 kg)  08/16/20 180 lb 9.6 oz (81.9 kg)      Other studies Reviewed: Additional studies/ records that were reviewed today include: TEE 01/07/2020 Review of the above records today demonstrates:  The left ventricular size is normal. There is normal left ventricular wall thickness. LV ejection fraction = 60-65%. No segmental wall motion abnormalities seen in the left ventricle The left atrium is moderately dilated. A left-to-right shunt was visualized by color flow Doppler.   ASSESSMENT AND PLAN:  1.  Persistent atrial fibrillation/status post ablation x2.  CHA2DS2-VASc of 2.  Currently on dofetilide and Xarelto.  High risk medication monitoring.  She has had minimal episodes of atrial fibrillation.  We Anjanette Gilkey continue with current management.  We Rajanee Schuelke check labs for dofetilide monitoring  today.   2.  Hyperlipidemia: Continue atorvastatin  3.  Lower extremity edema: She has edema once or twice a week.  She is trying to have a low-salt diet.  We Jaymeson Mengel give her Lasix 20 mg to take on an as-needed basis.  Current medicines are reviewed at length with the patient today.   The patient does not have concerns regarding her medicines.  The following changes were made today: As needed Lasix  Labs/ tests ordered today include:  No orders of the defined types were placed in this encounter.    Disposition:   FU with Moyinoluwa Dawe 6 months  Signed, Emilyn Ruble Jorja Loa, MD  05/30/2021 12:06 PM     [facility] [address] [place] [address] [phone] (office) [phone] (fax)

## 2021-05-31 LAB — BASIC METABOLIC PANEL
BUN/Creatinine Ratio: 16 (ref 12–28)
BUN: 16 mg/dL (ref 8–27)
CO2: 23 mmol/L (ref 20–29)
Calcium: 9.7 mg/dL (ref 8.7–10.3)
Chloride: 106 mmol/L (ref 96–106)
Creatinine, Ser: 1.01 mg/dL — ABNORMAL HIGH (ref 0.57–1.00)
Glucose: 102 mg/dL — ABNORMAL HIGH (ref 65–99)
Potassium: 4.1 mmol/L (ref 3.5–5.2)
Sodium: 144 mmol/L (ref 134–144)
eGFR: 58 mL/min/{1.73_m2} — ABNORMAL LOW (ref >59)

## 2021-05-31 LAB — CBC
Hematocrit: 37.8 % (ref 34.0–46.6)
Hemoglobin: 13 g/dL (ref 11.1–15.9)
MCH: 32 pg (ref 26.6–33.0)
MCHC: 34.4 g/dL (ref 31.5–35.7)
MCV: 93 fL (ref 79–97)
Platelets: 206 10*3/uL (ref 150–450)
RBC: 4.06 x10E6/uL (ref 3.77–5.28)
RDW: 13.1 % (ref 11.7–15.4)
WBC: 4.4 10*3/uL (ref 3.4–10.8)

## 2021-05-31 LAB — MAGNESIUM: Magnesium: 2 mg/dL (ref 1.6–2.3)

## 2021-10-31 ENCOUNTER — Other Ambulatory Visit: Payer: Self-pay | Admitting: *Deleted

## 2021-10-31 MED ORDER — DOFETILIDE 125 MCG PO CAPS: 125.0000 ug | ORAL_CAPSULE | Freq: Two times a day (BID) | ORAL | 1 refills | Status: DC

## 2021-11-08 ENCOUNTER — Other Ambulatory Visit: Payer: Self-pay | Admitting: *Deleted

## 2021-11-08 DIAGNOSIS — I480 Paroxysmal atrial fibrillation: Secondary | ICD-10-CM

## 2021-11-08 MED ORDER — RIVAROXABAN 20 MG PO TABS: 20.0000 mg | ORAL_TABLET | Freq: Every day | ORAL | 1 refills | Status: DC

## 2021-11-08 MED ORDER — DILTIAZEM HCL ER COATED BEADS 360 MG PO CP24: 360.0000 mg | ORAL_CAPSULE | Freq: Every day | ORAL | 3 refills | Status: DC

## 2021-11-08 NOTE — Telephone Encounter (Signed)
Xarelto 20mg  paper refill request received. Pt is 75 years old, weight-83.5kg, Crea-1.01 on 05/30/2021, last seen by [facility] on 05/30/2021, Diagnosis-Afib, CrCl-63.47ml/min; Dose is appropriate based on dosing criteria. Will send in refill to requested pharmacy.

## 2021-12-02 ENCOUNTER — Other Ambulatory Visit: Payer: Self-pay

## 2021-12-02 ENCOUNTER — Encounter: Payer: Self-pay | Admitting: Cardiology

## 2021-12-02 ENCOUNTER — Ambulatory Visit (INDEPENDENT_AMBULATORY_CARE_PROVIDER_SITE_OTHER): Payer: Medicare PPO | Admitting: Cardiology

## 2021-12-02 VITALS — BP 124/72 | HR 67 | Ht 69.5 in | Wt 185.2 lb

## 2021-12-02 DIAGNOSIS — Z79899 Other long term (current) drug therapy: Secondary | ICD-10-CM

## 2021-12-02 DIAGNOSIS — I4819 Other persistent atrial fibrillation: Secondary | ICD-10-CM

## 2021-12-02 LAB — MAGNESIUM: Magnesium: 2.3 mg/dL (ref 1.6–2.3)

## 2021-12-02 MED ORDER — DILTIAZEM HCL ER COATED BEADS 300 MG PO CP24: 300.0000 mg | ORAL_CAPSULE | Freq: Every day | ORAL | 3 refills | Status: DC

## 2021-12-02 NOTE — Patient Instructions (Addendum)
Medication Instructions:  Your physician has recommended you make the following change in your medication:  DECREASE Diltiazem to 300 mg once day  *If you need a refill on your cardiac medications before your next appointment, please call your pharmacy*   Lab Work: Tikosyn surveillance lab today: Magnesium level  If you have labs (blood work) drawn today and your tests are completely normal, you will receive your results only by: MyChart Message (if you have MyChart) OR A paper copy in the mail If you have any lab test that is abnormal or we need to change your treatment, we will call you to review the results.   Testing/Procedures: None ordered   Follow-Up: At [facility], you and your health needs are our priority.  As part of our continuing mission to provide you with exceptional heart care, we have created designated Provider Care Teams.  These Care Teams include your primary Cardiologist (physician) and Advanced Practice Providers (APPs -  Physician Assistants and Nurse Practitioners) who all work together to provide you with the care you need, when you need it.  Your next appointment:   6 month(s)  The format for your next appointment:   In Person  Provider:   Loman Brooklyn, MD    Thank you for choosing [facility]!!   Dory Horn, RN [phone]   Other Instructions

## 2021-12-02 NOTE — Addendum Note (Signed)
Addended by: Baird Lyons on: 12/02/2021 04:19 PM   Modules accepted: Orders

## 2021-12-02 NOTE — Progress Notes (Signed)
Electrophysiology Office Note   Date:  12/02/2021   ID:  Rebecca Carter, DOB September 13, 1946, MRN [id number]  PCP:  Lorenda Hatchet, FNP  Cardiologist: Minna Merritts Primary Electrophysiologist:  Aleksis Jiggetts Meredith Leeds, MD    Chief Complaint: Second opinion   History of Present Illness: Rebecca Carter is a 75 y.o. female who is being seen today for the evaluation of atrial fibrillation at the request of Zanard, Bernadene Bell, MD. Presenting today for electrophysiology evaluation.  She has a history significant for atrial fibrillation and atrial flutter.  She is status post ablation 12/29/2019.  She also has hypothyroidism and hyperlipidemia.  She had a repeat PVI.  She went into atypical atrial flutter and had an ablation from the right superior pulmonary vein to the mitral valve with change in activation due to a repeat dependent flutter.  Ablation was performed from the right superior pulmonary vein to the left superior pulmonary vein which resulted in termination of tachycardia.  She also had a cavotricuspid isthmus ablation.  She is now status post admission for dofetilide.  Today, denies symptoms of palpitations, chest pain, shortness of breath, orthopnea, PND, lower extremity edema, claudication, dizziness, presyncope, syncope, bleeding, or neurologic sequela. The patient is tolerating medications without difficulties.  Since being seen she has done well.  She is only had 4 or 5 episodes of atrial fibrillation, all of which are short-lived.  She is overall very happy with her control.  She does state that her heart rate is slow at night after taking her diltiazem.  She wakes up sometimes at night feeling short of breath.   Past Medical History:  Diagnosis Date   Allergic rhinitis    Past Surgical History:  Procedure Laterality Date   ADENOIDECTOMY     APPENDECTOMY     HERNIA REPAIR     pulmonary vein abrasion     SHOULDER ARTHROSCOPY     TONSILLECTOMY       Current Outpatient Medications   Medication Sig Dispense Refill   atorvastatin (LIPITOR) 20 MG tablet Take 20 mg by mouth daily.     azelastine (ASTELIN) 0.1 % nasal spray Place 2 sprays into both nostrils 2 (two) times daily. (Patient taking differently: Place 2 sprays into both nostrils daily as needed.) 30 mL 5   Cholecalciferol (VITAMIN D3) 2000 units capsule Take 2,000 Units by mouth daily.      diltiazem (CARDIZEM CD) 300 MG 24 hr capsule Take 1 capsule (300 mg total) by mouth daily. 90 capsule 3   dofetilide (TIKOSYN) 125 MCG capsule Take 1 capsule (125 mcg total) by mouth 2 (two) times daily. 180 capsule 1   famotidine (PEPCID) 40 MG tablet Take 40 mg by mouth daily.     fexofenadine (ALLEGRA) 180 MG tablet Take 180 mg by mouth daily as needed for allergies.      furosemide (LASIX) 20 MG tablet Take 1 tablet (20 mg total) by mouth daily as needed (swelling). 30 tablet 3   pantoprazole (PROTONIX) 40 MG tablet Take 40 mg by mouth at bedtime.     rivaroxaban (XARELTO) 20 MG TABS tablet Take 1 tablet (20 mg total) by mouth daily with supper. 90 tablet 1   fenofibrate (TRICOR) 145 MG tablet Take 145 mg by mouth every other day. Alternate with Lipitor     levothyroxine (SYNTHROID, LEVOTHROID) 75 MCG tablet Take 75 mcg by mouth daily before breakfast.      metoprolol tartrate (LOPRESSOR) 25 MG tablet Take 1 tablet (25 mg total)  by mouth 2 (two) times daily as needed (for palpitations). 30 tablet 3   No current facility-administered medications for this visit.    Allergies:   Sulfa antibiotics, Augmentin [amoxicillin-pot clavulanate], Trazodone, and Latex   Social History:  The patient  reports that she has never smoked. She has never used smokeless tobacco. She reports current alcohol use of about 2.0 standard drinks per week. She reports that she does not use drugs.   Family History:  The patient's family history includes Asthma in her mother and sister.   ROS:  Please see the history of present illness.   Otherwise,  review of systems is positive for none.   All other systems are reviewed and negative.   PHYSICAL EXAM: VS:  BP 124/72    Pulse 67    Ht 5' 9.5" (1.765 m)    Wt 185 lb 3.2 oz (84 kg)    SpO2 98%    BMI 26.96 kg/m  , BMI Body mass index is 26.96 kg/m. GEN: Well nourished, well developed, in no acute distress  HEENT: normal  Neck: no JVD, carotid bruits, or masses Cardiac: RRR; no murmurs, rubs, or gallops,no edema  Respiratory:  clear to auscultation bilaterally, normal work of breathing GI: soft, nontender, nondistended, + BS MS: no deformity or atrophy  Skin: warm and dry Neuro:  Strength and sensation are intact Psych: euthymic mood, full affect  EKG:  EKG is ordered today. Personal review of the ekg ordered shows sinus rhythm, rate 67  Recent Labs: 05/30/2021: BUN 16; Creatinine, Ser 1.01; Hemoglobin 13.0; Magnesium 2.0; Platelets 206; Potassium 4.1; Sodium 144    Lipid Panel  No results found for: CHOL, TRIG, HDL, CHOLHDL, VLDL, LDLCALC, LDLDIRECT   Wt Readings from Last 3 Encounters:  12/02/21 185 lb 3.2 oz (84 kg)  05/30/21 184 lb (83.5 kg)  11/23/20 182 lb 9.6 oz (82.8 kg)      Other studies Reviewed: Additional studies/ records that were reviewed today include: TEE 01/07/2020 Review of the above records today demonstrates:  The left ventricular size is normal. There is normal left ventricular wall thickness. LV ejection fraction = 60-65%. No segmental wall motion abnormalities seen in the left ventricle The left atrium is moderately dilated. A left-to-right shunt was visualized by color flow Doppler.   ASSESSMENT AND PLAN:  1.  Persistent atrial fibrillation: Status post ablation x2.  CHA2DS2-VASc of 2.  Currently on dofetilide 125 mcg twice daily, diltiazem 360 mg daily, Xarelto 20 mg daily.  High risk medication monitoring via ECG and labs.  Noticed that her heart rate is slow at night which makes her feel short of breath.  We Londyn Hotard decrease diltiazem to 300 mg  daily.  We Alanny Rivers check blood work today for dofetilide monitoring.  2.  Hyperlipidemia: Continue atorvastatin 20 mg per primary physician  3.  Lower extremity edema: Takes as needed Lasix.  Current medicines are reviewed at length with the patient today.   The patient does not have concerns regarding her medicines.  The following changes were made today: Decrease diltiazem  Labs/ tests ordered today include:  Orders Placed This Encounter  Procedures   Magnesium   EKG 12-Lead      Disposition:   FU with Tahj Lindseth 6 months  Signed, Seung Nidiffer Jorja Loa, MD  12/02/2021 12:11 PM     [facility] [address] [place] [address] [phone] (office) [phone] (fax)

## 2022-01-17 ENCOUNTER — Telehealth: Payer: Self-pay | Admitting: Cardiology

## 2022-01-17 NOTE — Telephone Encounter (Signed)
Pt called stating she realized she forgot to take her PM medications last night. She took her Xarelto just a few minutes but her dose of tikosyn was missed. Pt encouraged compliance and will see pt tomorrow as scheduled.

## 2022-01-17 NOTE — Telephone Encounter (Signed)
Patient c/o Palpitations:  High priority if patient c/o lightheadedness, shortness of breath, or chest pain  How long have you had palpitations/irregular HR/ Afib? Are you having the symptoms now? About a month and getting worse... currently has an elevated hr   Are you currently experiencing lightheadedness, SOB or CP? Dizziness   Do you have a history of afib (atrial fibrillation) or irregular heart rhythm? yes  Have you checked your BP or HR? (document readings if available): 150  Are you experiencing any other symptoms? no

## 2022-01-17 NOTE — Telephone Encounter (Signed)
Pt states that she has noticed progression of high heart rates.  States she has been running in the 120s for a few weeks and noticed she is now getting into the 150s.  Takes Tikosyn 125mg  BID and Diltiazem 360mg  QD.  Diltiazem was decreased to 300mg  at appt in December but pt states she never decreased the dose.  Takes Diltiazem in the evenings between 8-10pm.  Denies CP or SOB.  Only having issues with dizziness.  Spoke with Dr. Curt Bears and he said to have pt seen by one of our APPs or the AF team.  No availability with our team.  Pt scheduled to see AF clinic tomorrow.  Pt made aware of appt and was given the parking code.  Reviewed when appropriate to go to ED.  Pt agreeable to plan.

## 2022-01-18 ENCOUNTER — Other Ambulatory Visit: Payer: Self-pay

## 2022-01-18 ENCOUNTER — Encounter (HOSPITAL_COMMUNITY): Payer: Self-pay | Admitting: Physician Assistant

## 2022-01-18 ENCOUNTER — Ambulatory Visit (HOSPITAL_COMMUNITY)
Admission: RE | Admit: 2022-01-18 | Discharge: 2022-01-18 | Disposition: A | Discharge status: Home / Self Care | Payer: Medicare (Managed Care) | Source: Ambulatory Visit | Attending: Physician Assistant | Admitting: Physician Assistant

## 2022-01-18 VITALS — BP 132/66 | HR 68 | Ht 69.5 in | Wt 184.8 lb

## 2022-01-18 DIAGNOSIS — I4819 Other persistent atrial fibrillation: Secondary | ICD-10-CM | POA: Diagnosis present

## 2022-01-18 DIAGNOSIS — E785 Hyperlipidemia, unspecified: Secondary | ICD-10-CM | POA: Diagnosis not present

## 2022-01-18 DIAGNOSIS — Z7901 Long term (current) use of anticoagulants: Secondary | ICD-10-CM | POA: Diagnosis not present

## 2022-01-18 DIAGNOSIS — D6869 Other thrombophilia: Secondary | ICD-10-CM | POA: Diagnosis not present

## 2022-01-18 DIAGNOSIS — Z79899 Other long term (current) drug therapy: Secondary | ICD-10-CM | POA: Diagnosis not present

## 2022-01-18 DIAGNOSIS — Z9989 Dependence on other enabling machines and devices: Secondary | ICD-10-CM | POA: Diagnosis not present

## 2022-01-18 DIAGNOSIS — G4730 Sleep apnea, unspecified: Secondary | ICD-10-CM | POA: Diagnosis not present

## 2022-01-18 DIAGNOSIS — E039 Hypothyroidism, unspecified: Secondary | ICD-10-CM | POA: Insufficient documentation

## 2022-01-18 MED ORDER — FUROSEMIDE 20 MG PO TABS: 20.0000 mg | ORAL_TABLET | Freq: Every day | ORAL | 3 refills | Status: DC | PRN

## 2022-01-18 NOTE — Progress Notes (Signed)
Primary Care Physician: Adolph PollackSmith, Natalie Reid, FNP Primary Cardiologist: Dr Rudolpho SevinAkbary Primary Electrophysiologist: Dr Elberta Fortisamnitz Referring Physician: Dr Wyvonne Lenzamnitz   Rebecca Carter is a 76 y.o. female with a history of persistent atrial fibrillation, atrial flutter, HLD, hypothyroidism who [facility]. Patient is on Xarelto for a CHADS2VASC score of 3. She had an afib ablation in 2016 and an afib/flutter ablation by Dr Rudolpho SevinAkbary on 12/29/19. Patient continued to have paroxysms of afib and presented to Dr Elberta Fortisamnitz for a second opinion on 04/01/20. Patient is s/p dofetilide admission 5/3-04/22/20. She did not require DCCV. After admission, she again developed persistent afib. Her diltiazem was increased and DCCV was arranged.  On follow up today, patient reports that on 01/16/22 she woke with heart racing. She realized at that time she had missed her evening dose of dofetilide. When she took her dose the next morning, she converted back to SR. She does state over the past month she has had several brief episodes of afib, none lasting longer than one hour. There are no specific triggers that she can identify. She is compliant with her CPAP.  Today, she denies symptoms of chest pain, orthopnea, PND, lower extremity edema, dizziness, presyncope, syncope, bleeding, or neurologic sequela. The patient is tolerating medications without difficulties and is otherwise without complaint today.    Atrial Fibrillation Risk Factors:  she does have symptoms or diagnosis of sleep apnea. She is compliant with CPAP. she does not have a history of rheumatic fever. she does not have a history of alcohol use.   she has a BMI of Body mass index is 26.9 kg/m.Marland Kitchen. Filed Weights   01/18/22 1128  Weight: 83.8 kg     Family History  Problem Relation Age of Onset   Asthma Mother    Asthma Sister      Atrial Fibrillation Management history:  Previous antiarrhythmic  drugs: Multaq, flecainide, sotalol, dofetilide  Previous cardioversions: none Previous ablations: 2016, 12/29/19 CHADS2VASC score: 3 Anticoagulation history: Xarelto    Past Medical History:  Diagnosis Date   Allergic rhinitis    Past Surgical History:  Procedure Laterality Date   ADENOIDECTOMY     APPENDECTOMY     HERNIA REPAIR     pulmonary vein abrasion     SHOULDER ARTHROSCOPY     TONSILLECTOMY      Current Outpatient Medications  Medication Sig Dispense Refill   atorvastatin (LIPITOR) 20 MG tablet Take 20 mg by mouth daily.     azelastine (ASTELIN) 0.1 % nasal spray Place 2 sprays into both nostrils 2 (two) times daily. 30 mL 5   Cholecalciferol (VITAMIN D3) 2000 units capsule Take 2,000 Units by mouth daily.      diltiazem (TIAZAC) 360 MG 24 hr capsule Take 360 mg by mouth daily.     dofetilide (TIKOSYN) 125 MCG capsule Take 1 capsule (125 mcg total) by mouth 2 (two) times daily. 180 capsule 1   famotidine (PEPCID) 40 MG tablet Take 40 mg by mouth daily.     fexofenadine (ALLEGRA) 180 MG tablet Take 180 mg by mouth daily as needed for allergies.      levothyroxine (SYNTHROID, LEVOTHROID) 75 MCG tablet Take 75 mcg by mouth daily before breakfast.      pantoprazole (PROTONIX) 40 MG tablet Take 40 mg by mouth at bedtime.     rivaroxaban (XARELTO) 20 MG TABS tablet Take 1 tablet (20 mg total) by mouth daily with supper. 90 tablet  1   furosemide (LASIX) 20 MG tablet Take 1 tablet (20 mg total) by mouth daily as needed (swelling). 30 tablet 3   No current facility-administered medications for this encounter.    Allergies  Allergen Reactions   Sulfa Antibiotics Rash    rash   Augmentin [Amoxicillin-Pot Clavulanate] Other (See Comments)    Stomach issues   Trazodone     nightmares   Latex Rash    Other reaction(s): RASH     Social History   Socioeconomic History   Marital status: Married    Spouse name: Not on file   Number of children: Not on file   Years of  education: Not on file   Highest education level: Not on file  Occupational History   Not on file  Tobacco Use   Smoking status: Never   Smokeless tobacco: Never  Vaping Use   Vaping Use: Never used  Substance and Sexual Activity   Alcohol use: Yes    Alcohol/week: 2.0 standard drinks    Types: 1 Glasses of wine, 1 Cans of beer per week    Comment: 1 glass of wine/beer once a month 01/18/22   Drug use: No   Sexual activity: Not on file  Other Topics Concern   Not on file  Social History Narrative   Not on file   Social Determinants of Health   Financial Resource Strain: Not on file  Food Insecurity: Not on file  Transportation Needs: Not on file  Physical Activity: Not on file  Stress: Not on file  Social Connections: Not on file  Intimate Partner Violence: Not on file     ROS- All systems are reviewed and negative except as per the HPI above.  Physical Exam: Vitals:   01/18/22 1128  BP: 132/66  Pulse: 68  Weight: 83.8 kg  Height: 5' 9.5" (1.765 m)    GEN- The patient is a well appearing elderly female, alert and oriented x 3 today.   HEENT-head normocephalic, atraumatic, sclera clear, conjunctiva pink, hearing intact, trachea midline. Lungs- Clear to ausculation bilaterally, normal work of breathing Heart- Regular rate and rhythm, no murmurs, rubs or gallops  GI- soft, NT, ND, + BS Extremities- no clubbing, cyanosis, or edema MS- no significant deformity or atrophy Skin- no rash or lesion Psych- euthymic mood, full affect Neuro- strength and sensation are intact   Wt Readings from Last 3 Encounters:  01/18/22 83.8 kg  12/02/21 84 kg  05/30/21 83.5 kg    EKG today demonstrates  SR, NST Vent. rate 68 BPM PR interval 170 ms QRS duration 90 ms QT/QTcB 440/467 ms  TEE 01/07/20 demonstrated  Review of the above records today demonstrates:  The left ventricular size is normal. There is normal left ventricular wall thickness. LV ejection fraction =  60-65%. No segmental wall motion abnormalities seen in the left ventricle The left atrium is moderately dilated. A left-to-right shunt was visualized by color flow Doppler.  Epic records are reviewed at length today  CHA2DS2-VASc Score = 3  The patient's score is based upon: CHF History: 0 HTN History: 0 Diabetes History: 0 Stroke History: 0 Vascular Disease History: 0 Age Score: 2 Gender Score: 1       ASSESSMENT AND PLAN: 1. Persistent Atrial Fibrillation (ICD10:  I48.19) The patient's CHA2DS2-VASc score is 3, indicating a 3.2% annual risk of stroke.   S/p afib ablation x2 and atrial flutter ablation. Loaded on dofetilide 5/3-04/22/20. Patient back in SR. We discussed rhythm control  options including changing to amiodarone. We agreed that we would like to avoid amiodarone for as long as possible given possible off target effects.  Continue diltiazem 360 mg daily  Continue dofetilide 125 mcg BID. QT stable. Continue Xarelto 20 mg daily  2. Secondary Hypercoagulable State (ICD10:  D68.69) The patient is at significant risk for stroke/thromboembolism based upon her CHA2DS2-VASc Score of 3.  Continue Rivaroxaban (Xarelto).     Follow up with Dr Curt Bears as scheduled. Sooner in AF clinic if we need to change AAD.    [facility] [address] [place], [place] [address] [phone] 01/18/2022 12:06 PM

## 2022-04-23 ENCOUNTER — Encounter: Payer: Self-pay | Admitting: Cardiology

## 2022-04-24 ENCOUNTER — Other Ambulatory Visit: Payer: Self-pay

## 2022-04-24 MED ORDER — DOFETILIDE 125 MCG PO CAPS: 125.0000 ug | ORAL_CAPSULE | Freq: Two times a day (BID) | ORAL | 0 refills | Status: DC

## 2022-05-07 ENCOUNTER — Encounter: Payer: Self-pay | Admitting: Cardiology

## 2022-05-08 ENCOUNTER — Other Ambulatory Visit: Payer: Self-pay | Admitting: Pharmacist

## 2022-05-08 DIAGNOSIS — I480 Paroxysmal atrial fibrillation: Secondary | ICD-10-CM

## 2022-05-08 MED ORDER — RIVAROXABAN 20 MG PO TABS: 20.0000 mg | ORAL_TABLET | Freq: Every day | ORAL | 1 refills | Status: DC

## 2022-05-30 ENCOUNTER — Encounter: Payer: Self-pay | Admitting: Cardiology

## 2022-05-30 ENCOUNTER — Ambulatory Visit (INDEPENDENT_AMBULATORY_CARE_PROVIDER_SITE_OTHER): Payer: Medicare (Managed Care) | Admitting: Cardiology

## 2022-05-30 VITALS — BP 126/64 | HR 56 | Ht 70.0 in | Wt 186.0 lb

## 2022-05-30 DIAGNOSIS — D6869 Other thrombophilia: Secondary | ICD-10-CM

## 2022-05-30 DIAGNOSIS — I480 Paroxysmal atrial fibrillation: Secondary | ICD-10-CM

## 2022-05-30 DIAGNOSIS — Z79899 Other long term (current) drug therapy: Secondary | ICD-10-CM | POA: Diagnosis not present

## 2022-05-30 MED ORDER — DILTIAZEM HCL ER COATED BEADS 300 MG PO CP24: 300.0000 mg | ORAL_CAPSULE | Freq: Every day | ORAL | 6 refills | Status: DC

## 2022-05-30 NOTE — Patient Instructions (Addendum)
Medication Instructions:  Your physician has recommended you make the following change in your medication:  DECREASE Diltiazem to 300 mg daily  *If you need a refill on your cardiac medications before your next appointment, please call your pharmacy*   Lab Work: Today: Magnesium level  If you have labs (blood work) drawn today and your tests are completely normal, you will receive your results only by: MyChart Message (if you have MyChart) OR A paper copy in the mail If you have any lab test that is abnormal or we need to change your treatment, we will call you to review the results.   Testing/Procedures: None ordered   Follow-Up: At [facility], you and your health needs are our priority.  As part of our continuing mission to provide you with exceptional heart care, we have created designated Provider Care Teams.  These Care Teams include your primary Cardiologist (physician) and Advanced Practice Providers (APPs -  Physician Assistants and Nurse Practitioners) who all work together to provide you with the care you need, when you need it.  Your next appointment:   6 month(s)  The format for your next appointment:   In Person  Provider:   You will follow up in the Atrial Fibrillation Clinic located at [facility]. Your provider will be: Rudi Coco, NP or Clint R. Fenton, PA-C    Thank you for choosing [facility]!!   Dory Horn, RN [phone]  Other Instructions   Important Information About Sugar

## 2022-05-30 NOTE — Progress Notes (Signed)
Electrophysiology Office Note   Date:  05/30/2022   ID:  Rebecca Carter, DOB 03/18/1946, MRN [id number]  PCP:  Lorenda Hatchet, FNP  Cardiologist: Minna Merritts Primary Electrophysiologist:  Halford Goetzke Meredith Leeds, MD    Chief Complaint: Second opinion   History of Present Illness: Rebecca Carter is a 76 y.o. female who is being seen today for the evaluation of atrial fibrillation at the request of Lorenda Hatchet, [place]. Presenting today for electrophysiology evaluation.  She has a history seen for atrial fibrillation and atrial flutter.  She is status post ablation 12/29/2019.  She had hypothyroidism and hyperlipidemia.  She went into atypical atrial flutter and had ablation for the right superior pulmonary vein to the mitral valve with change in activation to a roof dependent flutter.  Ablation was performed from the right to left superior pulmonary veins which terminated tachycardia.  She also had cavotricuspid isthmus ablation.  She has since been loaded on dofetilide.  Today, denies symptoms of palpitations, chest pain, shortness of breath, orthopnea, PND, lower extremity edema, claudication, dizziness, presyncope, syncope, bleeding, or neurologic sequela. The patient is tolerating medications without difficulties.  Over the last 6 months she has had a few episodes of atrial fibrillation.  In December, atrial fibrillation worsened.  It got better over the next few months, but she did have 1 episode after missing a dose of dofetilide.  When she took her next dose, she went back into normal rhythm.  She is currently happy with her control.  Past Medical History:  Diagnosis Date   Allergic rhinitis    Past Surgical History:  Procedure Laterality Date   ADENOIDECTOMY     APPENDECTOMY     HERNIA REPAIR     pulmonary vein abrasion     SHOULDER ARTHROSCOPY     TONSILLECTOMY       Current Outpatient Medications  Medication Sig Dispense Refill   atorvastatin (LIPITOR) 20 MG tablet Take  20 mg by mouth daily.     azelastine (ASTELIN) 0.1 % nasal spray Place 2 sprays into both nostrils 2 (two) times daily. 30 mL 5   Cholecalciferol (VITAMIN D3) 2000 units capsule Take 2,000 Units by mouth daily.      diltiazem (TIAZAC) 360 MG 24 hr capsule Take 360 mg by mouth daily.     dofetilide (TIKOSYN) 125 MCG capsule Take 1 capsule (125 mcg total) by mouth 2 (two) times daily. Please keep June appointment for future refills.  Thank you. 180 capsule 0   fexofenadine (ALLEGRA) 180 MG tablet Take 180 mg by mouth daily as needed for allergies.      furosemide (LASIX) 20 MG tablet Take 1 tablet (20 mg total) by mouth daily as needed (swelling). 30 tablet 3   pantoprazole (PROTONIX) 40 MG tablet Take 40 mg by mouth at bedtime.     rivaroxaban (XARELTO) 20 MG TABS tablet Take 1 tablet (20 mg total) by mouth daily with supper. 90 tablet 1   levothyroxine (SYNTHROID, LEVOTHROID) 75 MCG tablet Take 75 mcg by mouth daily before breakfast.      No current facility-administered medications for this visit.    Allergies:   Sulfa antibiotics, Augmentin [amoxicillin-pot clavulanate], Trazodone, and Latex   Social History:  The patient  reports that she has never smoked. She has never used smokeless tobacco. She reports current alcohol use of about 2.0 standard drinks of alcohol per week. She reports that she does not use drugs.   Family History:  The patient's family  history includes Asthma in her mother and sister.   ROS:  Please see the history of present illness.   Otherwise, review of systems is positive for none.   All other systems are reviewed and negative.   PHYSICAL EXAM: VS:  BP 126/64   Pulse (!) 56   Ht 5\' 10"  (1.778 m)   Wt 186 lb (84.4 kg)   SpO2 96%   BMI 26.69 kg/m  , BMI Body mass index is 26.69 kg/m. GEN: Well nourished, well developed, in no acute distress  HEENT: normal  Neck: no JVD, carotid bruits, or masses Cardiac: RRR; no murmurs, rubs, or gallops,no edema   Respiratory:  clear to auscultation bilaterally, normal work of breathing GI: soft, nontender, nondistended, + BS MS: no deformity or atrophy  Skin: warm and dry Neuro:  Strength and sensation are intact Psych: euthymic mood, full affect  EKG:  EKG is ordered today. Personal review of the ekg ordered shows sinus rhythm  Recent Labs: 05/30/2021: BUN 16; Creatinine, Ser 1.01; Hemoglobin 13.0; Platelets 206; Potassium 4.1; Sodium 144 12/02/2021: Magnesium 2.3    Lipid Panel  No results found for: "CHOL", "TRIG", "HDL", "CHOLHDL", "VLDL", "LDLCALC", "LDLDIRECT"   Wt Readings from Last 3 Encounters:  05/30/22 186 lb (84.4 kg)  01/18/22 184 lb 12.8 oz (83.8 kg)  12/02/21 185 lb 3.2 oz (84 kg)      Other studies Reviewed: Additional studies/ records that were reviewed today include: TEE 01/07/2020 Review of the above records today demonstrates:  The left ventricular size is normal. There is normal left ventricular wall thickness. LV ejection fraction = 60-65%. No segmental wall motion abnormalities seen in the left ventricle The left atrium is moderately dilated. A left-to-right shunt was visualized by color flow Doppler.   ASSESSMENT AND PLAN:  1.  Persistent atrial fibrillation: Status post ablation x2.  Currently on dofetilide 125 mcg twice daily, diltiazem 360 mg daily, Xarelto 20 mg daily.  CHA2DS2-VASc of 2.  She feels tired and fatigued at times at night.  We Court Gracia reduce her diltiazem to 300 mg daily.  Otherwise we Reece Mcbroom check a magnesium for dofetilide monitoring.  2.  Hyperlipidemia: Continue atorvastatin 20 mg per primary physician  3.  Secondary hypercoagulable state: Currently on Xarelto for atrial fibrillation as above.  Current medicines are reviewed at length with the patient today.   The patient does not have concerns regarding her medicines.  The following changes were made today: Decrease diltiazem  Labs/ tests ordered today include:  Orders Placed This  Encounter  Procedures   Magnesium   EKG 12-Lead      Disposition:   FU 6 months  Signed, Nikaya Nasby Meredith Leeds, MD  05/30/2022 1:49 PM     [address] [place] [address] [phone] (office) [phone] (fax)

## 2022-05-31 LAB — MAGNESIUM: Magnesium: 2.5 mg/dL — ABNORMAL HIGH (ref 1.6–2.3)

## 2022-06-01 ENCOUNTER — Encounter: Payer: Self-pay | Admitting: Cardiology

## 2022-08-02 ENCOUNTER — Other Ambulatory Visit: Payer: Self-pay

## 2022-08-02 MED ORDER — DOFETILIDE 125 MCG PO CAPS: 125.0000 ug | ORAL_CAPSULE | Freq: Two times a day (BID) | ORAL | 3 refills | Status: DC

## 2022-08-02 NOTE — Telephone Encounter (Signed)
Pt's medication was sent to pt's pharmacy as requested. Confirmation received.  °

## 2022-08-10 ENCOUNTER — Encounter: Payer: Self-pay | Admitting: Cardiology

## 2022-08-10 ENCOUNTER — Other Ambulatory Visit: Payer: Self-pay

## 2022-08-10 DIAGNOSIS — I480 Paroxysmal atrial fibrillation: Secondary | ICD-10-CM

## 2022-08-10 MED ORDER — RIVAROXABAN 20 MG PO TABS: 20.0000 mg | ORAL_TABLET | Freq: Every day | ORAL | 1 refills | Status: DC

## 2022-08-10 NOTE — Telephone Encounter (Signed)
Prescription refill request for Xarelto received.  Indication:Afib Last office visit:6/23 Weight:84.4 kg Age:76 Scr:0.9 CrCl:70.85 ml/min  Prescription refilled

## 2022-08-10 NOTE — Telephone Encounter (Signed)
This encounter was created in error - please disregard.

## 2022-08-14 ENCOUNTER — Encounter: Payer: Self-pay | Admitting: Family Medicine

## 2022-08-14 ENCOUNTER — Other Ambulatory Visit (HOSPITAL_COMMUNITY): Payer: Self-pay

## 2022-08-14 ENCOUNTER — Ambulatory Visit (INDEPENDENT_AMBULATORY_CARE_PROVIDER_SITE_OTHER): Payer: Medicare (Managed Care) | Admitting: Family Medicine

## 2022-08-14 VITALS — BP 156/71 | HR 74 | Ht 70.0 in | Wt 187.0 lb

## 2022-08-14 DIAGNOSIS — G4730 Sleep apnea, unspecified: Secondary | ICD-10-CM | POA: Diagnosis not present

## 2022-08-14 DIAGNOSIS — E786 Lipoprotein deficiency: Secondary | ICD-10-CM

## 2022-08-14 DIAGNOSIS — K219 Gastro-esophageal reflux disease without esophagitis: Secondary | ICD-10-CM | POA: Diagnosis not present

## 2022-08-14 DIAGNOSIS — E785 Hyperlipidemia, unspecified: Secondary | ICD-10-CM | POA: Diagnosis not present

## 2022-08-14 DIAGNOSIS — Z23 Encounter for immunization: Secondary | ICD-10-CM | POA: Diagnosis not present

## 2022-08-14 DIAGNOSIS — R030 Elevated blood-pressure reading, without diagnosis of hypertension: Secondary | ICD-10-CM

## 2022-08-14 MED ORDER — ATORVASTATIN CALCIUM 40 MG PO TABS
40.0000 mg | ORAL_TABLET | Freq: Every day | ORAL | 3 refills | Status: DC
  Filled 2022-08-14: qty 90, 90d supply, fill #0

## 2022-08-14 NOTE — Assessment & Plan Note (Signed)
The 10-year ASCVD risk score (Arnett DK, et al., 2019) is: 33.4%   Values used to calculate the score:     Age: 76 years     Sex: Female     Is Non-Hispanic African American: No     Diabetic: No     Tobacco smoker: No     Systolic Blood Pressure: 156 mmHg     Is BP treated: Yes     HDL Cholesterol: 84 MG/DL     Total Cholesterol: 222 MG/DL - recommended high intensity statin. Have increased atorvastatin to 40mg  nightly - will recheck in 6ish months to see if cholesterol has decreased. Recommended low cholesterol diet.

## 2022-08-14 NOTE — Assessment & Plan Note (Signed)
-   referral sent to sleep medicine. Pt states she can't get good sleep with her CPAP - discussed how she would not be a good candidate for sleep aids such as hydroxyzine, doxepin, trazodone, ambien due to concern/risk for respiratory depression - recommend she discuss with sleep medicine and see if there are any machine adjustments that can be made.

## 2022-08-14 NOTE — Progress Notes (Signed)
New Patient Office Visit  Subjective    Patient ID: Rebecca Carter, female    DOB: 1946-01-02  Age: 76 y.o. MRN: [id number]  CC:  Chief Complaint  Patient presents with   Establish Care    HPI Rebecca Carter presents to establish care.   Hx of acid reflux  She is taking pantoprazole 40mg  and previous NP was concerned about her kidney function. Based on labs in epic kidney function looks good.   Hx of hyperlipidemia She was taking atorvastatin 20mg  and is wondering if this is good for her.   She has OSA She is on cpap. She has the nose piece. She was [facility] and has trouble sleeping with the   She was diagnosed in 2020.    The 10-year ASCVD risk score (Arnett DK, et al., 2019) is: 33.4%   Values used to calculate the score:     Age: 14 years     Sex: Female     Is Non-Hispanic African American: No     Diabetic: No     Tobacco smoker: No     Systolic Blood Pressure: 156 mmHg     Is BP treated: Yes     HDL Cholesterol: 84 MG/DL     Total Cholesterol: 222 MG/DL   Outpatient Encounter Medications as of 08/14/2022  Medication Sig   atorvastatin (LIPITOR) 40 MG tablet Take 1 tablet (40 mg total) by mouth daily.   azelastine (ASTELIN) 0.1 % nasal spray Place 2 sprays into both nostrils 2 (two) times daily.   Cholecalciferol (VITAMIN D3) 2000 units capsule Take 2,000 Units by mouth daily.    diltiazem (CARDIZEM CD) 300 MG 24 hr capsule Take 1 capsule (300 mg total) by mouth daily.   dofetilide (TIKOSYN) 125 MCG capsule Take 1 capsule (125 mcg total) by mouth 2 (two) times daily.   fexofenadine (ALLEGRA) 180 MG tablet Take 180 mg by mouth daily as needed for allergies.    furosemide (LASIX) 20 MG tablet Take 1 tablet (20 mg total) by mouth daily as needed (swelling).   pantoprazole (PROTONIX) 40 MG tablet Take 40 mg by mouth at bedtime.   rivaroxaban (XARELTO) 20 MG TABS tablet Take 1 tablet (20 mg total) by mouth daily with supper.    [DISCONTINUED] atorvastatin (LIPITOR) 20 MG tablet Take 20 mg by mouth daily.   levothyroxine (SYNTHROID, LEVOTHROID) 75 MCG tablet Take 75 mcg by mouth daily before breakfast.    No facility-administered encounter medications on file as of 08/14/2022.    Past Medical History:  Diagnosis Date   Allergic rhinitis     Past Surgical History:  Procedure Laterality Date   ADENOIDECTOMY     APPENDECTOMY     HERNIA REPAIR     pulmonary vein abrasion     SHOULDER ARTHROSCOPY     TONSILLECTOMY      Family History  Problem Relation Age of Onset   Asthma Mother    Asthma Sister     Social History   Socioeconomic History   Marital status: Married    Spouse name: Not on file   Number of children: Not on file   Years of education: Not on file   Highest education level: Not on file  Occupational History   Not on file  Tobacco Use   Smoking status: Never   Smokeless tobacco: Never  Vaping Use   Vaping Use: Never used  Substance and Sexual Activity   Alcohol use: Yes  Alcohol/week: 2.0 standard drinks of alcohol    Types: 1 Glasses of wine, 1 Cans of beer per week    Comment: 1 glass of wine/beer once a month 01/18/22   Drug use: No   Sexual activity: Not on file  Other Topics Concern   Not on file  Social History Narrative   Not on file   Social Determinants of Health   Financial Resource Strain: Not on file  Food Insecurity: Not on file  Transportation Needs: Not on file  Physical Activity: Not on file  Stress: Not on file  Social Connections: Not on file  Intimate Partner Violence: Not on file    Review of Systems  Constitutional:  Negative for chills and fever.  Respiratory:  Negative for cough and shortness of breath.   Cardiovascular:  Negative for chest pain.  Neurological:  Negative for headaches.        Objective    BP (!) 156/71   Pulse 74   Ht 5\' 10"  (1.778 m)   Wt 187 lb (84.8 kg)   SpO2 97%   BMI 26.83 kg/m   Physical Exam Vitals and  nursing note reviewed.  Constitutional:      General: She is not in acute distress.    Appearance: Normal appearance.  HENT:     Head: Normocephalic and atraumatic.     Right Ear: External ear normal.     Left Ear: External ear normal.     Nose: Nose normal.  Eyes:     Conjunctiva/sclera: Conjunctivae normal.  Cardiovascular:     Rate and Rhythm: Normal rate and regular rhythm.  Pulmonary:     Effort: Pulmonary effort is normal.     Breath sounds: Normal breath sounds.  Neurological:     General: No focal deficit present.     Mental Status: She is alert and oriented to person, place, and time.  Psychiatric:        Mood and Affect: Mood normal.        Behavior: Behavior normal.        Thought Content: Thought content normal.        Judgment: Judgment normal.        Assessment & Plan:   Problem List Items Addressed This Visit       Respiratory   Sleep apnea - Primary    - referral sent to sleep medicine. Pt states she can't get good sleep with her CPAP - discussed how she would not be a good candidate for sleep aids such as hydroxyzine, doxepin, trazodone, ambien due to concern/risk for respiratory depression - recommend she discuss with sleep medicine and see if there are any machine adjustments that can be made.       Relevant Orders   Ambulatory referral to Sleep Studies     Digestive   Gastroesophageal reflux disease without esophagitis    - continue pantoprazole  - kidney function within normal range  - did discuss if she notes increased acid reflux on this medication to let me know as we may need to change formulations.         Other   Hyperlipidemia    The 10-year ASCVD risk score (Arnett DK, et al., 2019) is: 33.4%   Values used to calculate the score:     Age: 34 years     Sex: Female     Is Non-Hispanic African American: No     Diabetic: No     Tobacco smoker: No  Systolic Blood Pressure: 156 mmHg     Is BP treated: Yes     HDL Cholesterol: 84  MG/DL     Total Cholesterol: 222 MG/DL - recommended high intensity statin. Have increased atorvastatin to 40mg  nightly - will recheck in 6ish months to see if cholesterol has decreased. Recommended low cholesterol diet.       Relevant Medications   atorvastatin (LIPITOR) 40 MG tablet   Elevated blood pressure reading    - repeat BP elevated. Pt declines medication at this time and would rather monitor symptoms at home. Did discussed if readings were elevated to come back to clinic to address this.        Return in about 6 months (around 02/14/2023).   02/16/2023, DO

## 2022-08-14 NOTE — Assessment & Plan Note (Signed)
-   continue pantoprazole  - kidney function within normal range  - did discuss if she notes increased acid reflux on this medication to let me know as we may need to change formulations.

## 2022-08-14 NOTE — Addendum Note (Signed)
Addended by: Lakindra Wible P on: 08/14/2022 05:19 PM   Modules accepted: Orders  

## 2022-08-14 NOTE — Assessment & Plan Note (Signed)
-   repeat BP elevated. Pt declines medication at this time and would rather monitor symptoms at home. Did discussed if readings were elevated to come back to clinic to address this.

## 2022-08-28 ENCOUNTER — Encounter: Payer: Self-pay | Admitting: Family Medicine

## 2022-09-04 ENCOUNTER — Encounter: Payer: Self-pay | Admitting: Cardiology

## 2022-09-21 ENCOUNTER — Encounter (HOSPITAL_COMMUNITY): Payer: Self-pay | Admitting: Physician Assistant

## 2022-09-21 ENCOUNTER — Ambulatory Visit (HOSPITAL_COMMUNITY)
Admission: RE | Admit: 2022-09-21 | Discharge: 2022-09-21 | Disposition: A | Discharge status: Home / Self Care | Payer: Medicare (Managed Care) | Source: Ambulatory Visit | Attending: Physician Assistant | Admitting: Physician Assistant

## 2022-09-21 VITALS — BP 146/70 | HR 71 | Wt 184.6 lb

## 2022-09-21 DIAGNOSIS — Z7901 Long term (current) use of anticoagulants: Secondary | ICD-10-CM | POA: Insufficient documentation

## 2022-09-21 DIAGNOSIS — E785 Hyperlipidemia, unspecified: Secondary | ICD-10-CM | POA: Diagnosis not present

## 2022-09-21 DIAGNOSIS — I4819 Other persistent atrial fibrillation: Secondary | ICD-10-CM | POA: Insufficient documentation

## 2022-09-21 DIAGNOSIS — I4892 Unspecified atrial flutter: Secondary | ICD-10-CM | POA: Diagnosis not present

## 2022-09-21 DIAGNOSIS — E039 Hypothyroidism, unspecified: Secondary | ICD-10-CM | POA: Insufficient documentation

## 2022-09-21 DIAGNOSIS — D6869 Other thrombophilia: Secondary | ICD-10-CM | POA: Insufficient documentation

## 2022-09-21 LAB — BASIC METABOLIC PANEL
Anion gap: 7 (ref 5–15)
BUN: 15 mg/dL (ref 8–23)
CO2: 26 mmol/L (ref 22–32)
Calcium: 9.1 mg/dL (ref 8.9–10.3)
Chloride: 105 mmol/L (ref 98–111)
Creatinine, Ser: 0.94 mg/dL (ref 0.44–1.00)
GFR, Estimated: >60 mL/min (ref >60)
Glucose, Bld: 124 mg/dL — ABNORMAL HIGH (ref 70–99)
Potassium: 3.6 mmol/L (ref 3.5–5.1)
Sodium: 138 mmol/L (ref 135–145)

## 2022-09-21 LAB — MAGNESIUM: Magnesium: 2.2 mg/dL (ref 1.7–2.4)

## 2022-09-21 NOTE — Patient Instructions (Signed)
Labs done today, your results will be available in MyChart, we will contact you for abnormal readings.  Your physician recommends that you schedule a follow-up appointment with Dr Curt Bears at [facility]

## 2022-09-21 NOTE — Progress Notes (Signed)
Primary Care Physician: Adolph Pollack, FNP Primary Cardiologist: Dr Rudolpho Sevin Primary Electrophysiologist: Dr Elberta Fortis Referring Physician: Dr Wyvonne Lenz Mogel is a 76 y.o. female with a history of persistent atrial fibrillation, atrial flutter, HLD, hypothyroidism who presents for follow up in the [facility]. Patient is on Xarelto for a CHADS2VASC score of 3. She had an afib ablation in 2016 and an afib/flutter ablation by Dr Rudolpho Sevin on 12/29/19. Patient continued to have paroxysms of afib and presented to Dr Elberta Fortis for a second opinion on 04/01/20. Patient is s/p dofetilide admission 5/3-04/22/20. She did not require DCCV.   On follow up today, patient reports that her afib has become more frequent again. It is occurring almost every day and can last several hours. Apple Watch strips personally reviewed today. She is compliant with her CPAP and abstaining from alcohol. There does not appear to be any specific trigger.   Today, she denies symptoms of chest pain, orthopnea, PND, lower extremity edema, dizziness, presyncope, syncope, bleeding, or neurologic sequela. The patient is tolerating medications without difficulties and is otherwise without complaint today.    Atrial Fibrillation Risk Factors:  she does have symptoms or diagnosis of sleep apnea. She is compliant with CPAP. she does not have a history of rheumatic fever. she does not have a history of alcohol use.   she has a BMI of Body mass index is 26.49 kg/m.Marland Kitchen Filed Weights   09/21/22 0912  Weight: 83.7 kg    Family History  Problem Relation Age of Onset   Asthma Mother    Asthma Sister      Atrial Fibrillation Management history:  Previous antiarrhythmic drugs: Multaq, flecainide, sotalol, dofetilide  Previous cardioversions: none Previous ablations: 2016, 12/29/19 CHADS2VASC score: 3 Anticoagulation history: Xarelto    Past Medical History:  Diagnosis Date   Allergic  rhinitis    Atrial fibrillation (HCC)    GERD (gastroesophageal reflux disease)    Hyperlipidemia    Hypothyroid    Past Surgical History:  Procedure Laterality Date   ADENOIDECTOMY     APPENDECTOMY     HERNIA REPAIR     pulmonary vein abrasion     SHOULDER ARTHROSCOPY     TONSILLECTOMY      Current Outpatient Medications  Medication Sig Dispense Refill   atorvastatin (LIPITOR) 40 MG tablet Take 1 tablet (40 mg total) by mouth daily. 90 tablet 3   azelastine (ASTELIN) 0.1 % nasal spray Place 2 sprays into both nostrils 2 (two) times daily. 30 mL 5   Cholecalciferol (VITAMIN D3) 2000 units capsule Take 2,000 Units by mouth daily.      diltiazem (CARDIZEM CD) 300 MG 24 hr capsule Take 1 capsule (300 mg total) by mouth daily. 30 capsule 6   dofetilide (TIKOSYN) 125 MCG capsule Take 1 capsule (125 mcg total) by mouth 2 (two) times daily. 180 capsule 3   fexofenadine (ALLEGRA) 180 MG tablet Take 180 mg by mouth daily as needed for allergies.      furosemide (LASIX) 20 MG tablet Take 1 tablet (20 mg total) by mouth daily as needed (swelling). 30 tablet 3   levothyroxine (SYNTHROID, LEVOTHROID) 75 MCG tablet Take 75 mcg by mouth daily before breakfast.      pantoprazole (PROTONIX) 40 MG tablet Take 40 mg by mouth at bedtime.     rivaroxaban (XARELTO) 20 MG TABS tablet Take 1 tablet (20 mg total) by mouth daily with supper. 90 tablet 1  No current facility-administered medications for this encounter.    Allergies  Allergen Reactions   Sulfa Antibiotics Rash    rash   Augmentin [Amoxicillin-Pot Clavulanate] Other (See Comments)    Stomach issues   Other Other (See Comments)    PATIENT TAKES XARELTO AND HAS CHRONIC KIDNEY DISEASE     Trazodone     nightmares   Latex Rash    Other reaction(s): RASH     Social History   Socioeconomic History   Marital status: Married    Spouse name: Not on file   Number of children: Not on file   Years of education: Not on file   Highest  education level: Not on file  Occupational History   Not on file  Tobacco Use   Smoking status: Never   Smokeless tobacco: Never  Vaping Use   Vaping Use: Never used  Substance and Sexual Activity   Alcohol use: Yes    Alcohol/week: 2.0 standard drinks of alcohol    Types: 1 Glasses of wine, 1 Cans of beer per week    Comment: 1 glass of wine/beer once a month 01/18/22   Drug use: No   Sexual activity: Not on file  Other Topics Concern   Not on file  Social History Narrative   Not on file   Social Determinants of Health   Financial Resource Strain: Not on file  Food Insecurity: Not on file  Transportation Needs: Not on file  Physical Activity: Not on file  Stress: Not on file  Social Connections: Not on file  Intimate Partner Violence: Not on file     ROS- All systems are reviewed and negative except as per the HPI above.  Physical Exam: Vitals:   09/21/22 0912  BP: (!) 146/70  Pulse: 71  Weight: 83.7 kg     GEN- The patient is a well appearing elderly female, alert and oriented x 3 today.   HEENT-head normocephalic, atraumatic, sclera clear, conjunctiva pink, hearing intact, trachea midline. Lungs- Clear to ausculation bilaterally, normal work of breathing Heart- Regular rate and rhythm, no murmurs, rubs or gallops  GI- soft, NT, ND, + BS Extremities- no clubbing, cyanosis, or edema MS- no significant deformity or atrophy Skin- no rash or lesion Psych- euthymic mood, full affect Neuro- strength and sensation are intact   Wt Readings from Last 3 Encounters:  09/21/22 83.7 kg  08/14/22 84.8 kg  05/30/22 84.4 kg    EKG today demonstrates  SR Vent. rate 71 BPM PR interval 198 ms QRS duration 86 ms QT/QTcB 428/465 ms  TEE 01/07/20 demonstrated  Review of the above records today demonstrates:  The left ventricular size is normal. There is normal left ventricular wall thickness. LV ejection fraction = 60-65%. No segmental wall motion abnormalities  seen in the left ventricle The left atrium is moderately dilated. A left-to-right shunt was visualized by color flow Doppler.  Epic records are reviewed at length today  CHA2DS2-VASc Score = 3  The patient's score is based upon: CHF History: 0 HTN History: 0 Diabetes History: 0 Stroke History: 0 Vascular Disease History: 0 Age Score: 2 Gender Score: 1       ASSESSMENT AND PLAN: 1. Persistent Atrial Fibrillation (ICD10:  I48.19) The patient's CHA2DS2-VASc score is 3, indicating a 3.2% annual risk of stroke.   S/p afib ablation x2 and atrial flutter ablation. Loaded on dofetilide 5/3-04/22/20. Patient has had a recent increase in her afib burden over the past several weeks. We discussed  alternate rhythm control options including amiodarone and repeat ablation. She would like to discuss the possibility of a 3rd ablation with Dr Elberta Fortis, will request follow up. Continue diltiazem 300 mg daily  Continue dofetilide 125 mcg BID. QT stable. Check bmet/mag today. Continue Xarelto 20 mg daily  2. Secondary Hypercoagulable State (ICD10:  D68.69) The patient is at significant risk for stroke/thromboembolism based upon her CHA2DS2-VASc Score of 3.  Continue Rivaroxaban (Xarelto).     Follow up with Dr Elberta Fortis to discuss possible ablation vs amiodarone.    Jorja Loa PA-C Af[facility] [address] [place], [place] [address] [phone] 09/21/2022 9:48 AM

## 2022-09-22 ENCOUNTER — Telehealth (HOSPITAL_COMMUNITY): Payer: Self-pay | Admitting: *Deleted

## 2022-09-22 DIAGNOSIS — I4819 Other persistent atrial fibrillation: Secondary | ICD-10-CM

## 2022-09-22 MED ORDER — POTASSIUM CHLORIDE ER 10 MEQ PO TBCR: 10.0000 meq | EXTENDED_RELEASE_TABLET | Freq: Every day | ORAL | 3 refills | Status: DC

## 2022-09-22 NOTE — Telephone Encounter (Signed)
-----   Message from Oliver Barre, [place] sent at 09/21/2022 12:26 PM EDT ----- Magnesium stable.  K+ low at 3.6 for dofetilide. Looks like she has been on supplementation in the past. Would recommend starting KCL 10 meq daily and rechecking in two weeks.

## 2022-09-22 NOTE — Telephone Encounter (Signed)
Pt aware, agreeable, and verbalized understanding, rx sent in, will have labs rechecked at [facility] on 10/17

## 2022-10-03 ENCOUNTER — Encounter: Payer: Self-pay | Admitting: Cardiology

## 2022-10-03 ENCOUNTER — Other Ambulatory Visit (HOSPITAL_COMMUNITY): Payer: Self-pay | Admitting: Physician Assistant

## 2022-10-03 ENCOUNTER — Ambulatory Visit: Payer: Medicare (Managed Care) | Attending: Cardiology | Admitting: Cardiology

## 2022-10-03 VITALS — BP 138/76 | HR 84 | Ht 70.0 in | Wt 185.2 lb

## 2022-10-03 DIAGNOSIS — D6869 Other thrombophilia: Secondary | ICD-10-CM

## 2022-10-03 DIAGNOSIS — I4819 Other persistent atrial fibrillation: Secondary | ICD-10-CM | POA: Diagnosis not present

## 2022-10-03 NOTE — Progress Notes (Signed)
Electrophysiology Office Note   Date:  10/03/2022   ID:  Rebecca Carter, DOB 06-30-1946, MRN [id number]  PCP:  Owens Loffler, DO  Cardiologist: Minna Merritts Primary Electrophysiologist:  Spurgeon Gancarz Meredith Leeds, MD    Chief Complaint: Second opinion   History of Present Illness: Rebecca Carter is a 76 y.o. female who is being seen today for the evaluation of atrial fibrillation at the request of No ref. provider found. Presenting today for electrophysiology evaluation.  She has a history seen for atrial fibrillation and atrial flutter.  She is status post cryoablation with repeat ablation 12/29/2019.  She also has hypothyroidism and hyperlipidemia.  She went into atrial flutter and had ablation from the right superior pulmonary vein to the mitral valve with a change in activation to a roof dependent flutter.  Ablation was performed from the right to left superior pulmonary veins which terminated tachycardia.  She also had cavotricuspid isthmus ablation.  She has since been loaded on dofetilide.  Today, denies symptoms of palpitations, chest pain, shortness of breath, orthopnea, PND, lower extremity edema, claudication, dizziness, presyncope, syncope, bleeding, or neurologic sequela. The patient is tolerating medications without difficulties.  She has continued to have episodes of atrial fibrillation.  The end of September to early October, she was in atrial fibrillation multiple times over those 3 weeks.  She cannot necessarily find a trigger, she did travel 1 week and it was just prior to her multiple episodes.  She feels quite on easy and anxious when she has these episodes as well as short of breath and fatigue.  She has not had an episode over the last 10 days to 2 weeks.   Past Medical History:  Diagnosis Date   Allergic rhinitis    Atrial fibrillation (HCC)    GERD (gastroesophageal reflux disease)    Hyperlipidemia    Hypothyroid    Past Surgical History:  Procedure Laterality Date    ADENOIDECTOMY     APPENDECTOMY     HERNIA REPAIR     pulmonary vein abrasion     SHOULDER ARTHROSCOPY     TONSILLECTOMY       Current Outpatient Medications  Medication Sig Dispense Refill   atorvastatin (LIPITOR) 40 MG tablet Take 1 tablet (40 mg total) by mouth daily. 90 tablet 3   azelastine (ASTELIN) 0.1 % nasal spray Place 2 sprays into both nostrils 2 (two) times daily. 30 mL 5   Cholecalciferol (VITAMIN D3) 2000 units capsule Take 2,000 Units by mouth daily.      diltiazem (CARDIZEM CD) 360 MG 24 hr capsule Take 360 mg by mouth daily.     dofetilide (TIKOSYN) 125 MCG capsule Take 1 capsule (125 mcg total) by mouth 2 (two) times daily. 180 capsule 3   fexofenadine (ALLEGRA) 180 MG tablet Take 180 mg by mouth daily as needed for allergies.      furosemide (LASIX) 20 MG tablet Take 1 tablet (20 mg total) by mouth daily as needed (swelling). 30 tablet 3   Levothyroxine Sodium 50 MCG CAPS Take by mouth daily before breakfast.     pantoprazole (PROTONIX) 40 MG tablet Take 40 mg by mouth at bedtime.     potassium chloride (KLOR-CON) 10 MEQ tablet Take 1 tablet (10 mEq total) by mouth daily. 30 tablet 3   rivaroxaban (XARELTO) 20 MG TABS tablet Take 1 tablet (20 mg total) by mouth daily with supper. 90 tablet 1   No current facility-administered medications for this visit.    Allergies:  Sulfa antibiotics, Augmentin [amoxicillin-pot clavulanate], Other, Trazodone, and Latex   Social History:  The patient  reports that she has never smoked. She has never used smokeless tobacco. She reports current alcohol use of about 2.0 standard drinks of alcohol per week. She reports that she does not use drugs.   Family History:  The patient's family history includes Asthma in her mother and sister.   ROS:  Please see the history of present illness.   Otherwise, review of systems is positive for none.   All other systems are reviewed and negative.   PHYSICAL EXAM: VS:  BP 138/76   Pulse 84    Ht 5\' 10"  (1.778 m)   Wt 185 lb 3.2 oz (84 kg)   SpO2 96%   BMI 26.57 kg/m  , BMI Body mass index is 26.57 kg/m. GEN: Well nourished, well developed, in no acute distress  HEENT: normal  Neck: no JVD, carotid bruits, or masses Cardiac: RRR; no murmurs, rubs, or gallops,no edema  Respiratory:  clear to auscultation bilaterally, normal work of breathing GI: soft, nontender, nondistended, + BS MS: no deformity or atrophy  Skin: warm and dry Neuro:  Strength and sensation are intact Psych: euthymic mood, full affect  EKG:  EKG is not ordered today. Personal review of the ekg ordered 09/21/22 shows sinus rhythm  Recent Labs: 09/21/2022: BUN 15; Creatinine, Ser 0.94; Magnesium 2.2; Potassium 3.6; Sodium 138    Lipid Panel  No results found for: "CHOL", "TRIG", "HDL", "CHOLHDL", "VLDL", "LDLCALC", "LDLDIRECT"   Wt Readings from Last 3 Encounters:  10/03/22 185 lb 3.2 oz (84 kg)  09/21/22 184 lb 9.6 oz (83.7 kg)  08/14/22 187 lb (84.8 kg)      Other studies Reviewed: Additional studies/ records that were reviewed today include: TEE 01/07/2020 Review of the above records today demonstrates:  The left ventricular size is normal. There is normal left ventricular wall thickness. LV ejection fraction = 60-65%. No segmental wall motion abnormalities seen in the left ventricle The left atrium is moderately dilated. A left-to-right shunt was visualized by color flow Doppler.   ASSESSMENT AND PLAN:  1.  Persistent atrial fibrillation: Status post ablation x2.  Currently on dofetilide 125 mcg twice daily, diltiazem 300 mg daily, Xarelto 20 mg daily.  CHA2DS2-VASc of 2.  She is unfortunately continuing to have episodes of atrial fibrillation.  She had quite a bit at the end of September and early October.  She has not had any further episodes since then.  It has been 10 days to 2 weeks since her most recent episode.  At this point, we Jayshaun Phillips continue to manage her atrial fibrillation with  dofetilide.  If she has more frequent episodes, she would be amenable to ablation.  2.  Hyperlipidemia: Continue atorvastatin 20 mg per primary physician  3.  Secondary hypercoagulable state: Currently on Xarelto for atrial fibrillation as above   Current medicines are reviewed at length with the patient today.   The patient does not have concerns regarding her medicines.  The following changes were made today: None  Labs/ tests ordered today include:  No orders of the defined types were placed in this encounter.     Disposition:   FU  6 months  Signed, Cashel Bellina November, MD  10/03/2022 2:02 PM     [facility] [address] [place] [phone] (office) [phone] (fax)

## 2022-10-03 NOTE — Patient Instructions (Addendum)
Medication Instructions:  Your physician recommends that you continue on your current medications as directed. Please refer to the Current Medication list given to you today.  *If you need a refill on your cardiac medications before your next appointment, please call your pharmacy*   Lab Work: None ordered If you have labs (blood work) drawn today and your tests are completely normal, you will receive your results only by: MyChart Message (if you have MyChart) OR A paper copy in the mail If you have any lab test that is abnormal or we need to change your treatment, we will call you to review the results.   Testing/Procedures: None ordered   Follow-Up: At [facility], you and your health needs are our priority.  As part of our continuing mission to provide you with exceptional heart care, we have created designated Provider Care Teams.  These Care Teams include your primary Cardiologist (physician) and Advanced Practice Providers (APPs -  Physician Assistants and Nurse Practitioners) who all work together to provide you with the care you need, when you need it.   Your next appointment:   6 month(s)  The format for your next appointment:   In Person  Provider:   Will Camnitz, MD    Thank you for choosing [facility]!!   Oracio Galen, RN [phone]  Other Instructions    Important Information About Sugar           

## 2022-10-04 LAB — BASIC METABOLIC PANEL WITH GFR
BUN/Creatinine Ratio: 17 (ref 12–28)
BUN: 18 mg/dL (ref 8–27)
CO2: 22 mmol/L (ref 20–29)
Calcium: 9.9 mg/dL (ref 8.7–10.3)
Chloride: 103 mmol/L (ref 96–106)
Creatinine, Ser: 1.04 mg/dL — ABNORMAL HIGH (ref 0.57–1.00)
Glucose: 107 mg/dL — ABNORMAL HIGH (ref 70–99)
Potassium: 4.4 mmol/L (ref 3.5–5.2)
Sodium: 147 mmol/L — ABNORMAL HIGH (ref 134–144)
eGFR: 56 mL/min/1.73 — ABNORMAL LOW

## 2022-10-06 ENCOUNTER — Encounter: Payer: Self-pay | Admitting: Cardiology

## 2022-10-18 ENCOUNTER — Ambulatory Visit: Payer: Medicare (Managed Care) | Admitting: Family Medicine

## 2022-10-20 ENCOUNTER — Encounter: Payer: Self-pay | Admitting: Cardiology

## 2022-10-24 ENCOUNTER — Ambulatory Visit: Payer: Medicare (Managed Care) | Admitting: Family Medicine

## 2022-11-01 ENCOUNTER — Other Ambulatory Visit: Payer: Self-pay

## 2022-11-01 MED ORDER — DILTIAZEM HCL ER COATED BEADS 360 MG PO CP24: 360.0000 mg | ORAL_CAPSULE | Freq: Every day | ORAL | 3 refills | Status: DC

## 2022-11-06 ENCOUNTER — Other Ambulatory Visit: Payer: Self-pay

## 2022-11-06 DIAGNOSIS — I480 Paroxysmal atrial fibrillation: Secondary | ICD-10-CM

## 2022-11-06 MED ORDER — RIVAROXABAN 20 MG PO TABS: 20.0000 mg | ORAL_TABLET | Freq: Every day | ORAL | 1 refills | Status: DC

## 2022-11-06 NOTE — Telephone Encounter (Signed)
Prescription refill request for Xarelto received.  Indication: Afib  Last office visit: 10/03/22 (Camnitz)  Weight: 84kg Age: 76 Scr: 1.04 (10/03/22)  CrCl:  61.73ml/min  Appropriate dose and refill sent to requested pharmacy.

## 2022-11-29 ENCOUNTER — Ambulatory Visit (HOSPITAL_COMMUNITY): Payer: Medicare (Managed Care) | Admitting: Physician Assistant

## 2023-01-01 ENCOUNTER — Telehealth: Payer: Self-pay | Admitting: Cardiology

## 2023-01-01 NOTE — Telephone Encounter (Signed)
Pt c/o medication issue:  1. Name of Medication:   dofetilide (TIKOSYN) 125 MCG capsule    2. How are you currently taking this medication (dosage and times per day)?   3. Are you having a reaction (difficulty breathing--STAT)?   4. What is your medication issue? Pt would like to switch from tikosyn to amiodarone because this medication isn't helping her afib.

## 2023-01-01 NOTE — Telephone Encounter (Signed)
Spoke to pt. Pt having daily afib for the past 3-4 weeks  It is affecting her sleep and ADLs sometime and she has no energy daily. Pt would like to know if she should stay on Tikosyn and tolerate the AFib until PVI 3/8, or should she switch to Amiodarone until ablation can be performed. HRs going into 150s. Aware I will discuss w/ MD and let her know by the end of the week. Patient verbalized understanding and agreeable to plan.

## 2023-01-09 ENCOUNTER — Encounter: Payer: Self-pay | Admitting: Cardiology

## 2023-01-09 MED ORDER — AMIODARONE HCL 200 MG PO TABS: ORAL_TABLET | ORAL | 3 refills | Status: DC

## 2023-01-09 NOTE — Telephone Encounter (Signed)
Apologized for the delay. Advised to stop Tikosyn, start Amiodarone. Aware I will send Amiodarone loading instructions via mychart, but also reviewed verbally. She will update me by next week on how she is doing on this change. If still having afib, will need to schedule a DCCV. Patient verbalized understanding and agreeable to plan.

## 2023-01-10 ENCOUNTER — Encounter: Payer: Self-pay | Admitting: Cardiology

## 2023-01-10 ENCOUNTER — Other Ambulatory Visit: Payer: Self-pay

## 2023-01-10 DIAGNOSIS — I4819 Other persistent atrial fibrillation: Secondary | ICD-10-CM

## 2023-01-12 MED ORDER — DOFETILIDE 125 MCG PO CAPS: 125.0000 ug | ORAL_CAPSULE | Freq: Two times a day (BID) | ORAL | 6 refills | Status: DC

## 2023-01-14 ENCOUNTER — Encounter: Payer: Self-pay | Admitting: Cardiology

## 2023-01-15 ENCOUNTER — Other Ambulatory Visit (HOSPITAL_COMMUNITY): Payer: Self-pay | Admitting: Physician Assistant

## 2023-01-16 ENCOUNTER — Ambulatory Visit: Payer: Medicare (Managed Care) | Attending: Cardiology

## 2023-01-16 DIAGNOSIS — I4819 Other persistent atrial fibrillation: Secondary | ICD-10-CM

## 2023-01-16 LAB — BASIC METABOLIC PANEL
BUN/Creatinine Ratio: 14 (ref 12–28)
BUN: 14 mg/dL (ref 8–27)
CO2: 29 mmol/L (ref 20–29)
Calcium: 9.4 mg/dL (ref 8.7–10.3)
Chloride: 106 mmol/L (ref 96–106)
Creatinine, Ser: 1.01 mg/dL — ABNORMAL HIGH (ref 0.57–1.00)
Glucose: 98 mg/dL (ref 70–99)
Potassium: 4.2 mmol/L (ref 3.5–5.2)
Sodium: 143 mmol/L (ref 134–144)
eGFR: 58 mL/min/{1.73_m2} — ABNORMAL LOW (ref >59)

## 2023-01-16 LAB — CBC
Hematocrit: 39.9 % (ref 34.0–46.6)
Hemoglobin: 13.2 g/dL (ref 11.1–15.9)
MCH: 31.5 pg (ref 26.6–33.0)
MCHC: 33.1 g/dL (ref 31.5–35.7)
MCV: 95 fL (ref 79–97)
Platelets: 200 10*3/uL (ref 150–450)
RBC: 4.19 x10E6/uL (ref 3.77–5.28)
RDW: 14.5 % (ref 11.7–15.4)
WBC: 5.7 10*3/uL (ref 3.4–10.8)

## 2023-01-19 NOTE — Telephone Encounter (Signed)
Informed pt of information I could. Pt familiar w/ ablation/instructions, but aware most likely MD will go over things the morning of procedure at the hospital. Will discuss w/ MD and call pt if he want's a telephone visit instead.  Pt understands I will only call if he wants a visit prior to procedure. She reports her last ablation lasted 7 hours and she had some fluid on her lungs post procedure.  Informed that I would make MD aware/remind him. Pt appreciates my follow up call and agreeable to plan.

## 2023-01-22 ENCOUNTER — Telehealth (HOSPITAL_COMMUNITY): Payer: Self-pay | Admitting: *Deleted

## 2023-01-22 NOTE — Telephone Encounter (Signed)
Reaching out to patient to offer assistance regarding upcoming cardiac imaging study; pt verbalizes understanding of appt date/time, parking situation and where to check in, pre-test NPO status, and verified current allergies; name and call back number provided for further questions should they arise  Justice Milliron RN Navigator Cardiac Imaging [facility] [phone] office [phone] cell  Patient aware to arrive at 1pm. 

## 2023-01-23 ENCOUNTER — Ambulatory Visit (HOSPITAL_COMMUNITY)
Admission: RE | Admit: 2023-01-23 | Discharge: 2023-01-23 | Disposition: A | Discharge status: Home / Self Care | Payer: Medicare (Managed Care) | Source: Ambulatory Visit | Attending: Cardiology | Admitting: Cardiology

## 2023-01-23 DIAGNOSIS — I4819 Other persistent atrial fibrillation: Secondary | ICD-10-CM | POA: Insufficient documentation

## 2023-01-23 MED ORDER — DILTIAZEM HCL 25 MG/5ML IV SOLN
10.0000 mg | INTRAVENOUS | Status: DC | PRN
  Administered 2023-01-23: 10 mg via INTRAVENOUS

## 2023-01-23 MED ORDER — IOHEXOL 350 MG/ML SOLN
100.0000 mL | Freq: Once | INTRAVENOUS | Status: AC | PRN
  Administered 2023-01-23: 100 mL via INTRAVENOUS

## 2023-01-23 MED ORDER — DILTIAZEM HCL 25 MG/5ML IV SOLN
INTRAVENOUS | Status: AC
  Filled 2023-01-23: qty 5

## 2023-01-29 NOTE — Pre-Procedure Instructions (Signed)
Attempted to call patient regarding procedure instructions.  Left voicemail on the following items: Arrival time 1100 Nothing to eat or drink after midnight No meds AM of procedure Responsible person to drive you home and stay with you for 24 hrs  Have you missed any doses of anti-coagulant Xarelto- if you have missed any doses please let the office know right away, don't take any medication in the am.

## 2023-01-30 ENCOUNTER — Encounter (HOSPITAL_COMMUNITY)
Admission: RE | Disposition: A | Discharge status: Home / Self Care | Payer: Self-pay | Source: Home / Self Care | Attending: Cardiology

## 2023-01-30 ENCOUNTER — Other Ambulatory Visit: Payer: Self-pay

## 2023-01-30 ENCOUNTER — Ambulatory Visit (HOSPITAL_COMMUNITY)
Admission: RE | Admit: 2023-01-30 | Discharge: 2023-01-30 | Disposition: A | Discharge status: Home / Self Care | Payer: Medicare (Managed Care) | Attending: Cardiology | Admitting: Cardiology

## 2023-01-30 ENCOUNTER — Ambulatory Visit (HOSPITAL_BASED_OUTPATIENT_CLINIC_OR_DEPARTMENT_OTHER): Payer: Medicare (Managed Care) | Admitting: Anesthesiology

## 2023-01-30 ENCOUNTER — Ambulatory Visit (HOSPITAL_COMMUNITY): Payer: Medicare (Managed Care) | Admitting: Anesthesiology

## 2023-01-30 DIAGNOSIS — I4891 Unspecified atrial fibrillation: Secondary | ICD-10-CM

## 2023-01-30 DIAGNOSIS — M1990 Unspecified osteoarthritis, unspecified site: Secondary | ICD-10-CM

## 2023-01-30 DIAGNOSIS — I4892 Unspecified atrial flutter: Secondary | ICD-10-CM | POA: Insufficient documentation

## 2023-01-30 DIAGNOSIS — E785 Hyperlipidemia, unspecified: Secondary | ICD-10-CM | POA: Insufficient documentation

## 2023-01-30 DIAGNOSIS — I4819 Other persistent atrial fibrillation: Secondary | ICD-10-CM | POA: Insufficient documentation

## 2023-01-30 DIAGNOSIS — F418 Other specified anxiety disorders: Secondary | ICD-10-CM

## 2023-01-30 DIAGNOSIS — E039 Hypothyroidism, unspecified: Secondary | ICD-10-CM

## 2023-01-30 HISTORY — PX: ATRIAL FIBRILLATION ABLATION: EP1191

## 2023-01-30 LAB — POCT ACTIVATED CLOTTING TIME: Activated Clotting Time: 385 seconds

## 2023-01-30 SURGERY — ATRIAL FIBRILLATION ABLATION
Anesthesia: General

## 2023-01-30 MED ORDER — ACETAMINOPHEN 500 MG PO TABS
ORAL_TABLET | ORAL | Status: AC
  Filled 2023-01-30: qty 2

## 2023-01-30 MED ORDER — ACETAMINOPHEN 500 MG PO TABS
1000.0000 mg | ORAL_TABLET | Freq: Once | ORAL | Status: AC
  Administered 2023-01-30: 1000 mg via ORAL

## 2023-01-30 MED ORDER — ONDANSETRON HCL 4 MG/2ML IJ SOLN: 4.0000 mg | Freq: Four times a day (QID) | INTRAMUSCULAR | Status: DC | PRN

## 2023-01-30 MED ORDER — ACETAMINOPHEN 325 MG PO TABS: 650.0000 mg | ORAL_TABLET | ORAL | Status: DC | PRN

## 2023-01-30 MED ORDER — DOBUTAMINE INFUSION FOR EP/ECHO/NUC (1000 MCG/ML)
INTRAVENOUS | Status: DC | PRN
  Administered 2023-01-30: 20 ug/kg/min via INTRAVENOUS

## 2023-01-30 MED ORDER — LIDOCAINE 2% (20 MG/ML) 5 ML SYRINGE
INTRAMUSCULAR | Status: DC | PRN
  Administered 2023-01-30: 60 mg via INTRAVENOUS

## 2023-01-30 MED ORDER — ROCURONIUM BROMIDE 10 MG/ML (PF) SYRINGE
PREFILLED_SYRINGE | INTRAVENOUS | Status: DC | PRN
  Administered 2023-01-30: 50 mg via INTRAVENOUS

## 2023-01-30 MED ORDER — FENTANYL CITRATE (PF) 250 MCG/5ML IJ SOLN
INTRAMUSCULAR | Status: DC | PRN
  Administered 2023-01-30 (×2): 50 ug via INTRAVENOUS

## 2023-01-30 MED ORDER — SUGAMMADEX SODIUM 200 MG/2ML IV SOLN
INTRAVENOUS | Status: DC | PRN
  Administered 2023-01-30: 175 mg via INTRAVENOUS

## 2023-01-30 MED ORDER — PHENYLEPHRINE 80 MCG/ML (10ML) SYRINGE FOR IV PUSH (FOR BLOOD PRESSURE SUPPORT)
PREFILLED_SYRINGE | INTRAVENOUS | Status: DC | PRN
  Administered 2023-01-30 (×2): 80 ug via INTRAVENOUS

## 2023-01-30 MED ORDER — HEPARIN SODIUM (PORCINE) 1000 UNIT/ML IJ SOLN
INTRAMUSCULAR | Status: DC | PRN
  Administered 2023-01-30: 14000 [IU] via INTRAVENOUS

## 2023-01-30 MED ORDER — SODIUM CHLORIDE 0.9 % IV SOLN: 250.0000 mL | INTRAVENOUS | Status: DC | PRN

## 2023-01-30 MED ORDER — HEPARIN SODIUM (PORCINE) 1000 UNIT/ML IJ SOLN
INTRAMUSCULAR | Status: DC | PRN
  Administered 2023-01-30: 1000 [IU] via INTRAVENOUS

## 2023-01-30 MED ORDER — ONDANSETRON HCL 4 MG/2ML IJ SOLN
INTRAMUSCULAR | Status: DC | PRN
  Administered 2023-01-30: 4 mg via INTRAVENOUS

## 2023-01-30 MED ORDER — SODIUM CHLORIDE 0.9 % IV SOLN: INTRAVENOUS | Status: DC

## 2023-01-30 MED ORDER — PHENYLEPHRINE HCL-NACL 20-0.9 MG/250ML-% IV SOLN
INTRAVENOUS | Status: DC | PRN
  Administered 2023-01-30: 25 ug/min via INTRAVENOUS

## 2023-01-30 MED ORDER — PROPOFOL 10 MG/ML IV BOLUS
INTRAVENOUS | Status: DC | PRN
  Administered 2023-01-30: 120 mg via INTRAVENOUS

## 2023-01-30 MED ORDER — HEPARIN (PORCINE) IN NACL 1000-0.9 UT/500ML-% IV SOLN
INTRAVENOUS | Status: DC | PRN
  Administered 2023-01-30 (×4): 500 mL

## 2023-01-30 MED ORDER — DEXAMETHASONE SODIUM PHOSPHATE 10 MG/ML IJ SOLN
INTRAMUSCULAR | Status: DC | PRN
  Administered 2023-01-30: 10 mg via INTRAVENOUS

## 2023-01-30 MED ORDER — PROTAMINE SULFATE 10 MG/ML IV SOLN
INTRAVENOUS | Status: DC | PRN
  Administered 2023-01-30: 20 mg via INTRAVENOUS
  Administered 2023-01-30: 10 mg via INTRAVENOUS

## 2023-01-30 MED ORDER — SODIUM CHLORIDE 0.9% FLUSH: 3.0000 mL | Freq: Two times a day (BID) | INTRAVENOUS | Status: DC

## 2023-01-30 MED ORDER — SODIUM CHLORIDE 0.9% FLUSH: 3.0000 mL | INTRAVENOUS | Status: DC | PRN

## 2023-01-30 SURGICAL SUPPLY — 20 items
BAG SNAP BAND KOVER 36X36 (MISCELLANEOUS) IMPLANT
CATH 8FR REPROCESSED SOUNDSTAR (CATHETERS) ×1 IMPLANT
CATH 8FR SOUNDSTAR REPROCESSED (CATHETERS) IMPLANT
CATH ABLAT QDOT MICRO BI TC DF (CATHETERS) IMPLANT
CATH OCTARAY 1.5 F (CATHETERS) IMPLANT
CATH PIGTAIL STEERABLE D1 8.7 (WIRE) IMPLANT
CATH S-M CIRCA TEMP PROBE (CATHETERS) IMPLANT
CATH WEB BI DIR CSDF CRV REPRO (CATHETERS) IMPLANT
CLOSURE PERCLOSE PROSTYLE (VASCULAR PRODUCTS) IMPLANT
COVER SWIFTLINK CONNECTOR (BAG) ×1 IMPLANT
PACK EP LATEX FREE (CUSTOM PROCEDURE TRAY) ×1
PACK EP LF (CUSTOM PROCEDURE TRAY) ×1 IMPLANT
PAD DEFIB RADIO PHYSIO CONN (PAD) ×1 IMPLANT
PATCH CARTO3 (PAD) IMPLANT
SHEATH CARTO VIZIGO SM CVD (SHEATH) IMPLANT
SHEATH PINNACLE 7F 10CM (SHEATH) IMPLANT
SHEATH PINNACLE 8F 10CM (SHEATH) IMPLANT
SHEATH PINNACLE 9F 10CM (SHEATH) IMPLANT
SHEATH PROBE COVER 6X72 (BAG) IMPLANT
TUBING SMART ABLATE COOLFLOW (TUBING) IMPLANT

## 2023-01-30 NOTE — Discharge Instructions (Signed)

## 2023-01-30 NOTE — Anesthesia Procedure Notes (Addendum)
Procedure Name: Intubation Date/Time: 01/30/2023 12:46 PM  Performed by: Mosetta Pigeon, CRNAPre-anesthesia Checklist: Patient identified, Emergency Drugs available, Suction available and Patient being monitored Patient Re-evaluated:Patient Re-evaluated prior to induction Oxygen Delivery Method: Circle System Utilized Preoxygenation: Pre-oxygenation with 100% oxygen Induction Type: IV induction Ventilation: Mask ventilation without difficulty Laryngoscope Size: Mac and 3 Grade View: Grade II Tube type: Oral Tube size: 7.0 mm Number of attempts: 1 Airway Equipment and Method: Stylet Placement Confirmation: ETT inserted through vocal cords under direct vision, positive ETCO2 and breath sounds checked- equal and bilateral Secured at: 22 cm Tube secured with: Tape Dental Injury: Teeth and Oropharynx as per pre-operative assessment

## 2023-01-30 NOTE — Progress Notes (Signed)
Pt ambulated to and from bathroom with no signs of oozing from bilateral groin sites

## 2023-01-30 NOTE — Anesthesia Preprocedure Evaluation (Addendum)
Anesthesia Evaluation  Patient identified by MRN, date of birth, ID band Patient awake    Reviewed: Allergy & Precautions, H&P , NPO status , Patient's Chart, lab work & pertinent test results  Airway Mallampati: II  TM Distance: >3 FB Neck ROM: Full    Dental no notable dental hx. (+) Teeth Intact, Dental Advisory Given   Pulmonary sleep apnea    Pulmonary exam normal breath sounds clear to auscultation       Cardiovascular + dysrhythmias Atrial Fibrillation  Rhythm:Irregular Rate:Normal     Neuro/Psych   Anxiety Depression    negative neurological ROS     GI/Hepatic Neg liver ROS,GERD  Medicated and Controlled,,  Endo/Other  Hypothyroidism    Renal/GU negative Renal ROS  negative genitourinary   Musculoskeletal  (+) Arthritis , Osteoarthritis,    Abdominal   Peds  Hematology negative hematology ROS (+)   Anesthesia Other Findings   Reproductive/Obstetrics negative OB ROS                             Anesthesia Physical Anesthesia Plan  ASA: 3  Anesthesia Plan: General   Post-op Pain Management: Tylenol PO (pre-op)*   Induction: Intravenous  PONV Risk Score and Plan: 4 or greater and Ondansetron, Dexamethasone and Treatment may vary due to age or medical condition  Airway Management Planned: Oral ETT  Additional Equipment:   Intra-op Plan:   Post-operative Plan: Extubation in OR  Informed Consent: I have reviewed the patients History and Physical, chart, labs and discussed the procedure including the risks, benefits and alternatives for the proposed anesthesia with the patient or authorized representative who has indicated his/her understanding and acceptance.     Dental advisory given  Plan Discussed with: CRNA  Anesthesia Plan Comments:        Anesthesia Quick Evaluation

## 2023-01-30 NOTE — Transfer of Care (Signed)
Immediate Anesthesia Transfer of Care Note  Patient: Rebecca Carter  Procedure(s) Performed: ATRIAL FIBRILLATION ABLATION  Patient Location: Cath Lab  Anesthesia Type:General  Level of Consciousness: awake, drowsy, and patient cooperative  Airway & Oxygen Therapy: Patient Spontanous Breathing and Patient connected to nasal cannula oxygen  Post-op Assessment: Report given to RN and Post -op Vital signs reviewed and stable  Post vital signs: Reviewed and stable  Last Vitals:  Vitals Value Taken Time  BP 107/49 01/30/23 1407  Temp    Pulse 73 01/30/23 1409  Resp 13 01/30/23 1409  SpO2 97 % 01/30/23 1409  Vitals shown include unvalidated device data.  Last Pain:  Vitals:   01/30/23 1113  TempSrc: Temporal         Complications: There were no known notable events for this encounter.

## 2023-01-30 NOTE — H&P (Signed)
Electrophysiology Office Note   Date:  01/30/2023   ID:  Rebecca Carter, DOB [birth year], MRN [id number]  PCP:  Lorenda Hatchet, FNP  Cardiologist: Minna Merritts Primary Electrophysiologist:  Haivyn Oravec Meredith Leeds, MD    Chief Complaint: Second opinion   History of Present Illness: Rebecca Carter is a 77 y.o. female who is being seen today for the evaluation of atrial fibrillation at the request of No ref. provider found. Presenting today for electrophysiology evaluation.  She has a history seen for atrial fibrillation and atrial flutter.  She is status post cryoablation with repeat ablation 12/29/2019.  She also has hypothyroidism and hyperlipidemia.  She went into atrial flutter and had ablation from the right superior pulmonary vein to the mitral valve with a change in activation to a roof dependent flutter.  Ablation was performed from the right to left superior pulmonary veins which terminated tachycardia.  She also had cavotricuspid isthmus ablation.  She has since been loaded on dofetilide.  Today, denies symptoms of palpitations, chest pain, shortness of breath, orthopnea, PND, lower extremity edema, claudication, dizziness, presyncope, syncope, bleeding, or neurologic sequela. The patient is tolerating medications without difficulties. Plan ablation today.    Past Medical History:  Diagnosis Date   Allergic rhinitis    Atrial fibrillation (HCC)    GERD (gastroesophageal reflux disease)    Hyperlipidemia    Hypothyroid    Past Surgical History:  Procedure Laterality Date   ADENOIDECTOMY     APPENDECTOMY     HERNIA REPAIR     pulmonary vein abrasion     SHOULDER ARTHROSCOPY     TONSILLECTOMY       Current Facility-Administered Medications  Medication Dose Route Frequency Provider Last Rate Last Admin   0.9 %  sodium chloride infusion   Intravenous Continuous Yarnell Arvidson, Ocie Doyne, MD 50 mL/hr at 01/30/23 1138 New Bag at 01/30/23 1138    Allergies:   Sulfa antibiotics,  Amoxicillin, Augmentin [amoxicillin-pot clavulanate], Other, Trazodone, and Latex   Social History:  The patient  reports that she has never smoked. She has never used smokeless tobacco. She reports current alcohol use of about 2.0 standard drinks of alcohol per week. She reports that she does not use drugs.   Family History:  The patient's family history includes Asthma in her mother and sister.   ROS:  Please see the history of present illness.   Otherwise, review of systems is positive for none.   All other systems are reviewed and negative.   PHYSICAL EXAM: VS:  BP (!) 151/69   Pulse 73   Temp 97.6 F (36.4 C) (Temporal)   Resp 16   Ht 5' 9"$  (1.753 m)   Wt 82.6 kg   SpO2 97%   BMI 26.88 kg/m  , BMI Body mass index is 26.88 kg/m. GEN: Well nourished, well developed, in no acute distress  HEENT: normal  Neck: no JVD, carotid bruits, or masses Cardiac: RRR; no murmurs, rubs, or gallops,no edema  Respiratory:  clear to auscultation bilaterally, normal work of breathing GI: soft, nontender, nondistended, + BS MS: no deformity or atrophy  Skin: warm and dry Neuro:  Strength and sensation are intact Psych: euthymic mood, full affect   Recent Labs: 09/21/2022: Magnesium 2.2 01/16/2023: BUN 14; Creatinine, Ser 1.01; Hemoglobin 13.2; Platelets 200; Potassium 4.2; Sodium 143    Lipid Panel  No results found for: "CHOL", "TRIG", "HDL", "CHOLHDL", "VLDL", "LDLCALC", "LDLDIRECT"   Wt Readings from Last 3 Encounters:  01/30/23 82.6 kg  10/03/22 84 kg  09/21/22 83.7 kg      Other studies Reviewed: Additional studies/ records that were reviewed today include: TEE 01/07/2020 Review of the above records today demonstrates:  The left ventricular size is normal. There is normal left ventricular wall thickness. LV ejection fraction = 60-65%. No segmental wall motion abnormalities seen in the left ventricle The left atrium is moderately dilated. A left-to-right shunt was visualized  by color flow Doppler.   ASSESSMENT AND PLAN:  1.  Persistent atrial fibrillation: Rebecca Carter has presented today for surgery, with the diagnosis of AF.  The various methods of treatment have been discussed with the patient and family. After consideration of risks, benefits and other options for treatment, the patient has consented to  Procedure(s): Catheter ablation as a surgical intervention .  Risks include but not limited to complete heart block, stroke, esophageal damage, nerve damage, bleeding, vascular damage, tamponade, perforation, MI, and death. The patient's history has been reviewed, patient examined, no change in status, stable for surgery.  I have reviewed the patient's chart and labs.  Questions were answered to the patient's satisfaction.    Rebecca Carter Curt Bears, MD 01/30/2023 11:58 AM

## 2023-01-30 NOTE — Anesthesia Postprocedure Evaluation (Signed)
Anesthesia Post Note  Patient: Rebecca Carter  Procedure(s) Performed: ATRIAL FIBRILLATION ABLATION     Patient location during evaluation: PACU Anesthesia Type: General Level of consciousness: awake and alert Pain management: pain level controlled Vital Signs Assessment: post-procedure vital signs reviewed and stable Respiratory status: spontaneous breathing, nonlabored ventilation and respiratory function stable Cardiovascular status: blood pressure returned to baseline and stable Postop Assessment: no apparent nausea or vomiting Anesthetic complications: no  There were no known notable events for this encounter.  Last Vitals:  Vitals:   01/30/23 1439 01/30/23 1440  BP:  (!) 133/49  Pulse:  (!) 58  Resp:  16  Temp: (!) 36.1 C   SpO2:  100%    Last Pain:  Vitals:   01/30/23 1439  TempSrc: Temporal  PainSc: 0-No pain                 Naarah Borgerding,W. EDMOND

## 2023-01-31 ENCOUNTER — Encounter (HOSPITAL_COMMUNITY): Payer: Self-pay | Admitting: Cardiology

## 2023-02-27 ENCOUNTER — Ambulatory Visit (HOSPITAL_COMMUNITY)
Admission: RE | Admit: 2023-02-27 | Discharge: 2023-02-27 | Disposition: A | Discharge status: Home / Self Care | Payer: Medicare (Managed Care) | Source: Ambulatory Visit | Attending: Cardiology | Admitting: Cardiology

## 2023-02-27 ENCOUNTER — Encounter (HOSPITAL_COMMUNITY): Payer: Self-pay | Admitting: Physician Assistant

## 2023-02-27 VITALS — BP 142/70 | HR 82 | Ht 69.0 in | Wt 183.6 lb

## 2023-02-27 DIAGNOSIS — Z79899 Other long term (current) drug therapy: Secondary | ICD-10-CM

## 2023-02-27 DIAGNOSIS — I4892 Unspecified atrial flutter: Secondary | ICD-10-CM | POA: Diagnosis not present

## 2023-02-27 DIAGNOSIS — D6869 Other thrombophilia: Secondary | ICD-10-CM | POA: Insufficient documentation

## 2023-02-27 DIAGNOSIS — I4819 Other persistent atrial fibrillation: Secondary | ICD-10-CM | POA: Insufficient documentation

## 2023-02-27 DIAGNOSIS — I700 Atherosclerosis of aorta: Secondary | ICD-10-CM | POA: Diagnosis not present

## 2023-02-27 DIAGNOSIS — Z7901 Long term (current) use of anticoagulants: Secondary | ICD-10-CM | POA: Diagnosis not present

## 2023-02-27 DIAGNOSIS — Z5181 Encounter for therapeutic drug level monitoring: Secondary | ICD-10-CM

## 2023-02-27 LAB — BASIC METABOLIC PANEL
Anion gap: 11 (ref 5–15)
BUN: 15 mg/dL (ref 8–23)
CO2: 23 mmol/L (ref 22–32)
Calcium: 9.1 mg/dL (ref 8.9–10.3)
Chloride: 107 mmol/L (ref 98–111)
Creatinine, Ser: 0.99 mg/dL (ref 0.44–1.00)
GFR, Estimated: 59 mL/min — ABNORMAL LOW (ref >60)
Glucose, Bld: 96 mg/dL (ref 70–99)
Potassium: 3.8 mmol/L (ref 3.5–5.1)
Sodium: 141 mmol/L (ref 135–145)

## 2023-02-27 LAB — MAGNESIUM: Magnesium: 2.2 mg/dL (ref 1.7–2.4)

## 2023-02-27 NOTE — Progress Notes (Signed)
Primary Care Physician: Lorenda Hatchet, FNP Primary Cardiologist: Dr Minna Merritts Primary Electrophysiologist: Dr Curt Bears Referring Physician: Dr Curt Bears   Rebecca Carter is a 77 y.o. female with a history of persistent atrial fibrillation, atrial flutter, HLD, aortic atherosclerosis, hypothyroidism who presents for follow up in the [facility]. Patient is on Xarelto for a CHADS2VASC score of 4. She had an afib ablation in 2016 and an afib/flutter ablation by Dr Minna Merritts on 12/29/19. Patient continued to have paroxysms of afib and presented to Dr Curt Bears for a second opinion on 04/01/20. Patient is s/p dofetilide admission 5/3-04/22/20. She did not require DCCV. She had an increased burden of afib and underwent another ablation with Dr Curt Bears on 01/30/23.  On follow up today, patient reports that since the ablation she has had afib about 50% of the days. The episodes typically last 2-5 hours. She denies chest pain, swallowing pain, or groin issues.   Today, she denies symptoms of chest pain, orthopnea, PND, lower extremity edema, dizziness, presyncope, syncope, bleeding, or neurologic sequela. The patient is tolerating medications without difficulties and is otherwise without complaint today.    Atrial Fibrillation Risk Factors:  she does have symptoms or diagnosis of sleep apnea. She is compliant with CPAP. she does not have a history of rheumatic fever. she does not have a history of alcohol use.   she has a BMI of Body mass index is 27.11 kg/m.Marland Kitchen Filed Weights   02/27/23 1333  Weight: 83.3 kg   Family History  Problem Relation Age of Onset   Asthma Mother    Asthma Sister      Atrial Fibrillation Management history:  Previous antiarrhythmic drugs: Multaq, flecainide, sotalol, dofetilide Previous cardioversions: none Previous ablations: 2016, 12/29/19, 01/30/23 CHADS2VASC score: 4 Anticoagulation history: Xarelto    Past Medical History:  Diagnosis  Date   Allergic rhinitis    Atrial fibrillation (HCC)    GERD (gastroesophageal reflux disease)    Hyperlipidemia    Hypothyroid    Past Surgical History:  Procedure Laterality Date   ADENOIDECTOMY     APPENDECTOMY     ATRIAL FIBRILLATION ABLATION N/A 01/30/2023   Procedure: ATRIAL FIBRILLATION ABLATION;  Surgeon: Constance Haw, MD;  Location: [facility];  Service: Cardiovascular;  Laterality: N/A;   HERNIA REPAIR     pulmonary vein abrasion     SHOULDER ARTHROSCOPY     TONSILLECTOMY      Current Outpatient Medications  Medication Sig Dispense Refill   atorvastatin (LIPITOR) 40 MG tablet Take 1 tablet (40 mg total) by mouth daily. 90 tablet 3   azelastine (ASTELIN) 0.1 % nasal spray Place 2 sprays into both nostrils 2 (two) times daily. (Patient taking differently: Place 2 sprays into both nostrils daily as needed for allergies.) 30 mL 5   Cholecalciferol (VITAMIN D3) 2000 units capsule Take 2,000 Units by mouth daily.      diltiazem (CARDIZEM CD) 360 MG 24 hr capsule Take 1 capsule (360 mg total) by mouth daily. 90 capsule 3   dofetilide (TIKOSYN) 125 MCG capsule Take 1 capsule (125 mcg total) by mouth 2 (two) times daily. 60 capsule 6   fexofenadine (ALLEGRA) 180 MG tablet Take 180 mg by mouth daily as needed for allergies.      furosemide (LASIX) 20 MG tablet Take 1 tablet (20 mg total) by mouth daily as needed (swelling). 30 tablet 3   Levothyroxine Sodium 50 MCG CAPS Take 50 mcg by mouth daily before  breakfast.     pantoprazole (PROTONIX) 40 MG tablet Take 40 mg by mouth at bedtime.     potassium chloride (KLOR-CON) 10 MEQ tablet TAKE ONE TABLET BY MOUTH ONE TIME DAILY 30 tablet 3   rivaroxaban (XARELTO) 20 MG TABS tablet Take 1 tablet (20 mg total) by mouth daily with supper. 90 tablet 1   No current facility-administered medications for this encounter.    Allergies  Allergen Reactions   Sulfa Antibiotics Rash    rash   Amoxicillin Other (See Comments)     Stomach issues   Augmentin [Amoxicillin-Pot Clavulanate] Other (See Comments)    Stomach issues   Nsaids Other (See Comments)    PATIENT TAKES XARELTO AND HAS CHRONIC KIDNEY DISEASE   Other Other (See Comments)    PATIENT TAKES XARELTO AND HAS CHRONIC KIDNEY DISEASE     Trazodone     nightmares   Latex Rash    Other reaction(s): RASH     Social History   Socioeconomic History   Marital status: Married    Spouse name: Not on file   Number of children: Not on file   Years of education: Not on file   Highest education level: Not on file  Occupational History   Not on file  Tobacco Use   Smoking status: Never   Smokeless tobacco: Never   Tobacco comments:    Never smoke 02/27/23  Vaping Use   Vaping Use: Never used  Substance and Sexual Activity   Alcohol use: Yes    Alcohol/week: 2.0 standard drinks of alcohol    Types: 1 Glasses of wine, 1 Cans of beer per week    Comment: 1 glass of wine/beer once a month 01/18/22   Drug use: No   Sexual activity: Not on file  Other Topics Concern   Not on file  Social History Narrative   Not on file   Social Determinants of Health   Financial Resource Strain: Not on file  Food Insecurity: Not on file  Transportation Needs: Not on file  Physical Activity: Not on file  Stress: Not on file  Social Connections: Not on file  Intimate Partner Violence: Not on file     ROS- All systems are reviewed and negative except as per the HPI above.  Physical Exam: Vitals:   02/27/23 1333  BP: (!) 142/70  Pulse: 82  Weight: 83.3 kg  Height: '5\' 9"'$  (1.753 m)     GEN- The patient is a well appearing female, alert and oriented x 3 today.   HEENT-head normocephalic, atraumatic, sclera clear, conjunctiva pink, hearing intact, trachea midline. Lungs- Clear to ausculation bilaterally, normal work of breathing Heart- Regular rate and rhythm, no murmurs, rubs or gallops  GI- soft, NT, ND, + BS Extremities- no clubbing, cyanosis, or  edema MS- no significant deformity or atrophy Skin- no rash or lesion Psych- euthymic mood, full affect Neuro- strength and sensation are intact   Wt Readings from Last 3 Encounters:  02/27/23 83.3 kg  01/30/23 82.6 kg  10/03/22 84 kg    EKG today demonstrates  SR Vent. rate 82 BPM PR interval 196 ms QRS duration 86 ms QT/QTcB 384/448 ms  TEE 01/07/20 demonstrated  Review of the above records today demonstrates:  The left ventricular size is normal. There is normal left ventricular wall thickness. LV ejection fraction = 60-65%. No segmental wall motion abnormalities seen in the left ventricle The left atrium is moderately dilated. A left-to-right shunt was visualized by  color flow Doppler.  Epic records are reviewed at length today  CHA2DS2-VASc Score = 4  The patient's score is based upon: CHF History: 0 HTN History: 0 Diabetes History: 0 Stroke History: 0 Vascular Disease History: 1 (aortic atherosclerosis) Age Score: 2 Gender Score: 1        ASSESSMENT AND PLAN: 1. Persistent Atrial Fibrillation/atrial flutter The patient's CHA2DS2-VASc score is 4, indicating a 4.8% annual risk of stroke.   S/p afib ablation x2 and atrial flutter ablation. Repeat ablation 01/30/23 Patient still having frequent symptoms. We discussed that she is still in the blanking period, hopefully her symptoms will improve as she continues to heal. If not, may need to change AAD to amiodarone. Patient will continue to monitor with her Apple Watch.  Continue diltiazem 360 mg daily  Continue dofetilide 125 mcg BID. QT stable. Check bmet/mag today.  Continue Xarelto 20 mg daily with no missed doses for 3 months post ablation.   2. Secondary Hypercoagulable State (ICD10:  D68.69) The patient is at significant risk for stroke/thromboembolism based upon her CHA2DS2-VASc Score of 4.  Continue Rivaroxaban (Xarelto).   3. Aortic atherosclerosis On statin CAC score very low 2.8   Follow up with  Dr Curt Bears as scheduled.    [facility] [address] [place], [place] [address] [phone] 02/27/2023 1:42 PM

## 2023-02-28 ENCOUNTER — Other Ambulatory Visit (HOSPITAL_COMMUNITY): Payer: Self-pay | Admitting: *Deleted

## 2023-02-28 DIAGNOSIS — I4819 Other persistent atrial fibrillation: Secondary | ICD-10-CM

## 2023-02-28 MED ORDER — POTASSIUM CHLORIDE ER 10 MEQ PO TBCR: 20.0000 meq | EXTENDED_RELEASE_TABLET | Freq: Every day | ORAL | 6 refills | Status: DC

## 2023-03-12 ENCOUNTER — Ambulatory Visit: Payer: Medicare (Managed Care) | Admitting: Cardiology

## 2023-03-23 ENCOUNTER — Encounter: Payer: Self-pay | Admitting: Cardiology

## 2023-03-23 MED ORDER — FUROSEMIDE 20 MG PO TABS: 20.0000 mg | ORAL_TABLET | Freq: Every day | ORAL | 6 refills | Status: DC | PRN

## 2023-03-26 ENCOUNTER — Other Ambulatory Visit (HOSPITAL_COMMUNITY): Payer: Self-pay | Admitting: Physician Assistant

## 2023-03-27 LAB — BASIC METABOLIC PANEL
BUN/Creatinine Ratio: 14 (ref 12–28)
BUN: 13 mg/dL (ref 8–27)
CO2: 22 mmol/L (ref 20–29)
Calcium: 9.5 mg/dL (ref 8.7–10.3)
Chloride: 110 mmol/L — ABNORMAL HIGH (ref 96–106)
Creatinine, Ser: 0.91 mg/dL (ref 0.57–1.00)
Glucose: 83 mg/dL (ref 70–99)
Potassium: 4.3 mmol/L (ref 3.5–5.2)
Sodium: 148 mmol/L — ABNORMAL HIGH (ref 134–144)
eGFR: 65 mL/min/{1.73_m2} (ref >59)

## 2023-04-04 ENCOUNTER — Encounter: Payer: Self-pay | Admitting: Cardiology

## 2023-04-06 ENCOUNTER — Encounter (HOSPITAL_BASED_OUTPATIENT_CLINIC_OR_DEPARTMENT_OTHER): Payer: Self-pay | Admitting: Emergency Medicine

## 2023-04-06 ENCOUNTER — Emergency Department (HOSPITAL_BASED_OUTPATIENT_CLINIC_OR_DEPARTMENT_OTHER)
Admission: EM | Admit: 2023-04-06 | Discharge: 2023-04-06 | Disposition: A | Discharge status: Home / Self Care | Payer: Medicare (Managed Care) | Attending: Emergency Medicine | Admitting: Emergency Medicine

## 2023-04-06 ENCOUNTER — Other Ambulatory Visit: Payer: Self-pay

## 2023-04-06 DIAGNOSIS — I4891 Unspecified atrial fibrillation: Secondary | ICD-10-CM | POA: Diagnosis not present

## 2023-04-06 DIAGNOSIS — Z91040 Latex allergy status: Secondary | ICD-10-CM | POA: Insufficient documentation

## 2023-04-06 DIAGNOSIS — Z7901 Long term (current) use of anticoagulants: Secondary | ICD-10-CM | POA: Diagnosis not present

## 2023-04-06 DIAGNOSIS — R002 Palpitations: Secondary | ICD-10-CM | POA: Diagnosis present

## 2023-04-06 LAB — CBC
HCT: 40.1 % (ref 36.0–46.0)
Hemoglobin: 13.3 g/dL (ref 12.0–15.0)
MCH: 31.6 pg (ref 26.0–34.0)
MCHC: 33.2 g/dL (ref 30.0–36.0)
MCV: 95.2 fL (ref 80.0–100.0)
Platelets: 233 10*3/uL (ref 150–400)
RBC: 4.21 MIL/uL (ref 3.87–5.11)
RDW: 13 % (ref 11.5–15.5)
WBC: 5.3 10*3/uL (ref 4.0–10.5)
nRBC: 0 % (ref 0.0–0.2)

## 2023-04-06 LAB — BASIC METABOLIC PANEL
Anion gap: 8 (ref 5–15)
BUN: 15 mg/dL (ref 8–23)
CO2: 22 mmol/L (ref 22–32)
Calcium: 8.6 mg/dL — ABNORMAL LOW (ref 8.9–10.3)
Chloride: 109 mmol/L (ref 98–111)
Creatinine, Ser: 1.12 mg/dL — ABNORMAL HIGH (ref 0.44–1.00)
GFR, Estimated: 51 mL/min — ABNORMAL LOW (ref >60)
Glucose, Bld: 109 mg/dL — ABNORMAL HIGH (ref 70–99)
Potassium: 3.6 mmol/L (ref 3.5–5.1)
Sodium: 139 mmol/L (ref 135–145)

## 2023-04-06 LAB — MAGNESIUM: Magnesium: 2 mg/dL (ref 1.7–2.4)

## 2023-04-06 MED ORDER — PROPOFOL 10 MG/ML IV BOLUS
1.0000 mg/kg | Freq: Once | INTRAVENOUS | Status: AC
  Administered 2023-04-06: 84.1 mg via INTRAVENOUS
  Filled 2023-04-06: qty 20

## 2023-04-06 MED ORDER — AMIODARONE HCL 200 MG PO TABS: 200.0000 mg | ORAL_TABLET | Freq: Every day | ORAL | 0 refills | Status: DC

## 2023-04-06 MED ORDER — AMIODARONE HCL 200 MG PO TABS
200.0000 mg | ORAL_TABLET | Freq: Every day | ORAL | Status: DC
  Administered 2023-04-06: 200 mg via ORAL
  Filled 2023-04-06: qty 1

## 2023-04-06 NOTE — Sedation Documentation (Addendum)
Assisted ventilations with BVM.

## 2023-04-06 NOTE — Discharge Instructions (Addendum)
You were given propofol medication for sedation for cardioversion in the ER.  We were able to cardiovert or convert your heart rhythm into a sinus rhythm.  We also started you on amiodarone.  It is important that you follow-up with your electrophysiologist, who is a heart rhythm specialist, as soon as possible for this visit to the ED today.  You should contact their office this weekend or early next week to discuss this visit and arrange for a follow-up appointment.  Amiodarone is an important medication for the heart that may require outpatient blood testing to be done.  You should return to the ER if you have dizziness, lightheadedness, loss of consciousness, new or worsening chest pain, or difficulty breathing.  You can continue all of your other typical medications at home as per usual.

## 2023-04-06 NOTE — Sedation Documentation (Signed)
200j cardioversion

## 2023-04-06 NOTE — Sedation Documentation (Signed)
200j cardioversion  

## 2023-04-06 NOTE — ED Provider Notes (Signed)
[place] [facility] Provider Note   CSN: [id number] Arrival date & time: 04/06/23  1033     History  Chief Complaint  Patient presents with   Palpitations    Rebecca Carter is a 77 y.o. female with a history of persistent atrial fibrillation and a flutter, status post multiple ablations, on Xarelto, Tikosyn and diltiazem, presenting to the ED with complaint of palpitations and lightheadedness.  Patient reports that her last ablation was 2 months ago in February with Dr Elberta Fortis, and she continues to have intermittent frequent episodes of atrial fibrillation.  She feels the frequency has increased over the past week, and came to the ED requesting cardioversion, adding that "At the A-fib clinic they told me that anytime I need cardioversion, I can just come into the ER".  Patient reports compliance with all of her medications at home including her Xarelto.  She does state that her electrophysiologist and recommended initiating amiodarone but the patient has not wanted to start this medicine due to concerns about side effects of this medication.  She does not tolerate beta-blockers and metoprolol.  HPI     Home Medications Prior to Admission medications   Medication Sig Start Date End Date Taking? Authorizing Provider  amiodarone (PACERONE) 200 MG tablet Take 1 tablet (200 mg total) by mouth daily. 04/07/23 05/07/23 Yes Ladiamond Gallina, Kermit Balo, MD  atorvastatin (LIPITOR) 40 MG tablet Take 1 tablet (40 mg total) by mouth daily. Patient taking differently: Take 40 mg by mouth every evening. 08/14/22  Yes Tamera Punt, Erika S, DO  azelastine (ASTELIN) 0.1 % nasal spray Place 2 sprays into both nostrils 2 (two) times daily. Patient taking differently: Place 2 sprays into both nostrils daily as needed for allergies. 01/29/19  Yes Bobbitt, Heywood Iles, MD  Cholecalciferol (VITAMIN D3) 2000 units capsule Take 2,000 Units by mouth daily.    Yes [provider]   diltiazem (CARDIZEM CD) 360 MG 24 hr capsule Take 1 capsule (360 mg total) by mouth daily. 11/01/22  Yes Camnitz, Will Daphine Deutscher, MD  dofetilide (TIKOSYN) 125 MCG capsule Take 1 capsule (125 mcg total) by mouth 2 (two) times daily. 01/12/23  Yes Camnitz, Will Daphine Deutscher, MD  fexofenadine (ALLEGRA) 180 MG tablet Take 180 mg by mouth daily as needed for allergies.  02/04/16  Yes [provider]  furosemide (LASIX) 20 MG tablet Take 1 tablet (20 mg total) by mouth daily as needed (swelling). 03/23/23  Yes Camnitz, Will Daphine Deutscher, MD  Levothyroxine Sodium 50 MCG CAPS Take 50 mcg by mouth daily before breakfast.   Yes [provider]  pantoprazole (PROTONIX) 40 MG tablet Take 40 mg by mouth daily.   Yes [provider]  potassium chloride (KLOR-CON) 10 MEQ tablet Take 2 tablets (20 mEq total) by mouth daily. Patient taking differently: Take 20 mEq by mouth at bedtime. 02/28/23  Yes Fenton, Clint R, PA  rivaroxaban (XARELTO) 20 MG TABS tablet Take 1 tablet (20 mg total) by mouth daily with supper. 11/06/22  Yes Camnitz, Andree Coss, MD      Allergies    Sulfa antibiotics, Amoxicillin, Augmentin [amoxicillin-pot clavulanate], Nsaids, Other, Trazodone, and Latex    Review of Systems   Review of Systems  Physical Exam Updated Vital Signs BP (!) 149/68   Pulse 72   Temp 98.2 F (36.8 C) (Oral)   Resp 16   Ht  (1.753 m)   Wt 84.1 kg   SpO2 100%   BMI 27.38 kg/m  Physical Exam Constitutional:      General: She is not in acute distress. HENT:     Head: Normocephalic and atraumatic.  Eyes:     Conjunctiva/sclera: Conjunctivae normal.     Pupils: Pupils are equal, round, and reactive to light.  Cardiovascular:     Rate and Rhythm: Tachycardia present. Rhythm irregular.  Pulmonary:     Effort: Pulmonary effort is normal. No respiratory distress.  Abdominal:     General: There is no distension.     Tenderness: There is no abdominal tenderness.  Skin:    General: Skin is  warm and dry.  Neurological:     General: No focal deficit present.     Mental Status: She is alert. Mental status is at baseline.  Psychiatric:        Mood and Affect: Mood normal.        Behavior: Behavior normal.     ED Results / Procedures / Treatments   Labs (all labs ordered are listed, but only abnormal results are displayed) Labs Reviewed  BASIC METABOLIC PANEL - Abnormal; Notable for the following components:      Result Value   Glucose, Bld 109 (*)    Creatinine, Ser 1.12 (*)    Calcium 8.6 (*)    GFR, Estimated 51 (*)    All other components within normal limits  CBC  MAGNESIUM    EKG EKG Interpretation  Date/Time:  Friday April 06 2023 10:41:09 EDT Ventricular Rate:  128 PR Interval:  192 QRS Duration: 91 QT Interval:  348 QTC Calculation: 508 R Axis:   -50 Text Interpretation: Sinus tachycardia vs A Fib Prolonged QT interval Confirmed by Alvester Chou [phone]) on 04/06/2023 10:45:09 AM  Radiology No results found.  Procedures .Sedation  Date/Time: 04/06/2023 4:59 PM  Performed by: Terald Sleeper, MD Authorized by: Terald Sleeper, MD   Consent:    Consent obtained:  Written   Consent given by:  Patient   Risks discussed:  Inadequate sedation, nausea, dysrhythmia, allergic reaction, prolonged hypoxia resulting in organ damage, prolonged sedation necessitating reversal, respiratory compromise necessitating ventilatory assistance and intubation and vomiting Universal protocol:    Procedure explained and questions answered to patient or proxy's satisfaction: yes     Relevant documents present and verified: yes     Test results available: yes     Imaging studies available: yes     Required blood products, implants, devices, and special equipment available: yes     Site/side marked: yes     Immediately prior to procedure, a time out was called: yes     Patient identity confirmed:  Arm band and verbally with patient Indications:    Procedure  performed:  Cardioversion   Procedure necessitating sedation performed by:  Physician performing sedation Pre-sedation assessment:    Time since last food or drink:  6 hour   ASA classification: class 2 - patient with mild systemic disease     Mallampati score:  II - soft palate, uvula, fauces visible   Pre-sedation assessments completed and reviewed: airway patency, cardiovascular function, hydration status, mental status, nausea/vomiting, pain level and respiratory function   Immediate pre-procedure details:    Reassessment: Patient reassessed immediately prior to procedure     Reviewed: vital signs, relevant labs/tests and NPO status     Verified: bag valve mask available, emergency equipment available, intubation equipment available, IV patency confirmed, oxygen available and reversal medications available   Procedure details (see MAR for exact dosages):  Preoxygenation:  Nasal cannula   Sedation:  Propofol   Intended level of sedation: deep   Analgesia:  None   Intra-procedure monitoring:  Blood pressure monitoring, cardiac monitor, continuous capnometry, frequent vital sign checks, frequent LOC assessments and continuous pulse oximetry   Intra-procedure events comment:  Apnea   Intra-procedure management:  Airway repositioning, supplemental oxygen and BVM ventilation   Total Provider sedation time (minutes):  40 Post-procedure details:    Attendance: Constant attendance by certified staff until patient recovered     Recovery: Patient returned to pre-procedure baseline     Post-sedation assessments completed and reviewed: airway patency, cardiovascular function, hydration status, mental status, nausea/vomiting, pain level, respiratory function and temperature     Patient is stable for discharge or admission: yes     Procedure completion:  Tolerated well, no immediate complications .Cardioversion  Date/Time: 04/06/2023 5:00 PM  Performed by: Terald Sleeper, MD Authorized by:  Terald Sleeper, MD   Consent:    Consent obtained:  Verbal   Consent given by:  Patient   Risks discussed:  Cutaneous burn, induced arrhythmia, death and pain   Alternatives discussed:  Observation and rate-control medication Pre-procedure details:    Cardioversion basis:  Elective   Rhythm:  Atrial fibrillation   Electrode placement:  Anterior-posterior Patient sedated: Yes. Refer to sedation procedure documentation for details of sedation.  Attempt one:    Cardioversion mode:  Synchronous   Waveform:  Biphasic   Shock (Joules):  200   Shock outcome:  Conversion to normal sinus rhythm Post-procedure details:    Patient status:  Awake   Patient tolerance of procedure:  Tolerated well, no immediate complications     Medications Ordered in ED Medications  amiodarone (PACERONE) tablet 200 mg (200 mg Oral Given 04/06/23 1220)  propofol (DIPRIVAN) 10 mg/mL bolus/IV push 84.1 mg (84.1 mg Intravenous Given 04/06/23 1430)    ED Course/ Medical Decision Making/ A&P Clinical Course as of 04/06/23 1659  Fri Apr 06, 2023  1145 Spoke to [employer], awaiting callback from cardiologist [MT]  1212 I spoke to DR Nahser from cardiology who agreed that given that the patient is compliant with her medications Xarelto would be reasonably safe to attempt a cardioversion, although it is difficult to predict how long this would maintain the patient in a sinus rhythm.  I discussed with the patient and her husband regarding the risks and benefits of cardioversion with propofol sedation.  We discussed the alternative, which would include initiation of another antiarrhythmic, amiodarone, which her electrophysiologist had wanted to start her on in February prior to her last ablation, according to medical records, but ultimately opted not to initiate amiodarone in favor of ablation.  The patient would prefer to try the oral amiodarone to see if we can achieve rate control.  If we are able to better  control her heart rate, she could be discharged with close cardiology outpatient follow-up.  Otherwise we can consider cardioversion at a later time this afternoon [MT]  1405 Patient remains in A-fib with RVR now, and is performing cardioversion, risk and benefits were discussed with the patient and her husband at bedside.  Will proceed with attempted cardioversion [MT]  1439 Cardioverted on 2nd attempt, now awake from sedation [MT]  1527 Patient is awake, remains in sinus rhythm.  She feels shaky and has some emotional lability now, which I suspect may be secondary to the propofol sedation.  Will monitor her for period longer in the ED,  and if she remains stable I would anticipate discharge home with EP follow up. [MT]    Clinical Course User Index [MT] Maliah Pyles, Kermit Balo, MD                             Medical Decision Making Amount and/or Complexity of Data Reviewed Labs: ordered.  Risk Prescription drug management.   Patient is presented to ED with palpitations found to be in A-fib with RVR, heart rate approximately 130s.  No evidence of pulmonary edema or congestive heart failure per initial presentation, no hypoxia.  We will check her electrolyte levels that she does report an issue with hypokalemia in the past.  I will speak to cardiology regarding the utility of an electrocardioversion, given her history of stubborn and persistent atrial fibrillation despite multiple ablations and antiarrhythmics.    Labs reviewed and unremarkable here  I reviewed the patient's external records including EP and cardiology office evaluations.  I personally reviewed the patient's EKG which my interpretation shows likely atrial fibrillation with RVR.  I reviewed her telemetry which does show A-fib with RVR, heart rate 120-1 30s.  Blood pressure has been stable.  I consulted by phone with the cardiologist, Dr Elease Hashimoto, regarding this patient's case and her history, see ed course  Amiodarone initiated in  ED. After 2 hour observation period with pt remained in A Fib with RVR, she elected for cardioversion  Patient cardioverted to NSR  Will observe until sober and stable for discharge          Final Clinical Impression(s) / ED Diagnoses Final diagnoses:  Atrial fibrillation with RVR    Rx / DC Orders ED Discharge Orders          Ordered    amiodarone (PACERONE) 200 MG tablet  Daily        04/06/23 1517              Terald Sleeper, MD 04/06/23 1701

## 2023-04-06 NOTE — ED Notes (Signed)
Patient ambulated to the bathroom without assistance.

## 2023-04-06 NOTE — Telephone Encounter (Signed)
Pt scheduled to see Dr. Elberta Fortis next Friday, 4/26 She appreciates the help with getting this appt and agreeable to plan.

## 2023-04-06 NOTE — Sedation Documentation (Signed)
Patient responsive to voice but breathing on her own.

## 2023-04-06 NOTE — ED Triage Notes (Addendum)
Palpitations since 4 yesterday has had several ablations she states  states feels weak

## 2023-04-06 NOTE — Sedation Documentation (Signed)
NSR

## 2023-04-09 ENCOUNTER — Encounter: Payer: Self-pay | Admitting: Cardiology

## 2023-04-10 MED ORDER — AMIODARONE HCL 200 MG PO TABS: 200.0000 mg | ORAL_TABLET | Freq: Every day | ORAL | 2 refills | Status: DC

## 2023-04-10 NOTE — Telephone Encounter (Signed)
Pt aware sending in requested Rx. Will discuss further at her appt on Friday. Patient verbalized understanding and agreeable to plan.

## 2023-04-13 ENCOUNTER — Encounter: Payer: Self-pay | Admitting: Cardiology

## 2023-04-13 ENCOUNTER — Ambulatory Visit: Payer: Medicare (Managed Care) | Attending: Cardiology | Admitting: Cardiology

## 2023-04-13 VITALS — BP 140/76 | HR 117 | Ht 69.0 in | Wt 183.0 lb

## 2023-04-13 DIAGNOSIS — Z79899 Other long term (current) drug therapy: Secondary | ICD-10-CM

## 2023-04-13 DIAGNOSIS — D6869 Other thrombophilia: Secondary | ICD-10-CM | POA: Diagnosis not present

## 2023-04-13 DIAGNOSIS — I4819 Other persistent atrial fibrillation: Secondary | ICD-10-CM

## 2023-04-13 NOTE — Patient Instructions (Addendum)
Medication Instructions:  Your physician recommends that you continue on your current medications as directed. Please refer to the Current Medication list given to you today.  *If you need a refill on your cardiac medications before your next appointment, please call your pharmacy*   Lab Work: None ordered If you have labs (blood work) drawn today and your tests are completely normal, you will receive your results only by: MyChart Message (if you have MyChart) OR A paper copy in the mail If you have any lab test that is abnormal or we need to change your treatment, we will call you to review the results.   Testing/Procedures: Your physician has recommended that you have a Cardioversion (DCCV). Electrical Cardioversion uses a jolt of electricity to your heart either through paddles or wired patches attached to your chest. This is a controlled, usually prescheduled, procedure. Defibrillation is done under light anesthesia in the hospital, and you usually go home the day of the procedure. This is done to get your heart back into a normal rhythm. You are not awake for the procedure. Please see the instructions below.      Dear Rebecca Carter  You are scheduled for a Cardioversion on Monday, May 6 with Dr. Izora Ribas.  Please arrive at the [facility] (Main Entrance A) at [facility]: [address] [place], [place] [address] at 8:30 AM (This time is 1 hour(s) before your procedure to ensure your preparation). Free valet parking service is available. You will check in at ADMITTING. The support person will be asked to wait in the waiting room.  It is OK to have someone drop you off and come back when you are ready to be discharged.      DIET:  Nothing to eat or drink after midnight except a sip of water with medications. Hold your Lasix the morning of this procedure.  MEDICATION INSTRUCTIONS: !!IF ANY NEW MEDICATIONS ARE STARTED AFTER TODAY, PLEASE NOTIFY YOUR PROVIDER AS SOON AS  POSSIBLE!!  FYI: Medications such as Semaglutide (Ozempic, [place]), Tirzepatide (Mounjaro, Zepbound), Dulaglutide (Trulicity), etc ("GLP1 agonists") must be held around the time of a procedure. Talk to your provider if you take one of these.  Continue taking your anticoagulant (blood thinner): Rivaroxaban (Xarelto).  You will need to continue this after your procedure until you are told by your provider that it is safe to stop.    LABS:  Your labs will be done at the hospital prior to your procedure  FYI:  For your safety, and to allow [place] to monitor your vital signs accurately during the surgery/procedure we request: If you have artificial nails, gel coating, SNS etc, please have those removed prior to your surgery/procedure. Not having the nail coverings /polish removed may result in cancellation or delay of your surgery/procedure.  You must have a responsible person to drive you home and stay in the waiting area during your procedure. Failure to do so could result in cancellation.  Bring your insurance cards.  *Special Note: Every effort is made to have your procedure done on time. Occasionally there are emergencies that occur at the hospital that may cause delays. Please be patient if a delay does occur.       Follow-Up: At [facility], you and your health needs are our priority.  As part of our continuing mission to provide you with exceptional heart care, we have created designated Provider Care Teams.  These Care Teams include your primary Cardiologist (physician) and Advanced Practice Providers (  APPs -  Physician Assistants and Nurse Practitioners) who all work together to provide you with the care you need, when you need it.  Your next appointment:   keep  your scheduled follow up next month on  05/09/23  The format for your next appointment:   In Person  Provider:   Loman Brooklyn, MD    Thank you for choosing [facility]!!   Dory Horn, RN [phone]

## 2023-04-13 NOTE — Progress Notes (Signed)
Electrophysiology Office Note   Date:  04/13/2023   ID:  Rebecca Carter, DOB [birth year], MRN [id number]  PCP:  Adolph Pollack, FNP  Cardiologist: Rudolpho Sevin Primary Electrophysiologist:  Alyssa Mancera Jorja Loa, MD    Chief Complaint: Second opinion   History of Present Illness: Rebecca Carter is a 77 y.o. female who is being seen today for the evaluation of atrial fibrillation at the request of Adolph Pollack, FNP. Presenting today for electrophysiology evaluation.  She has a history seen for atrial fibrillation and atrial flutter.  She is post cryoablation with repeat ablation 12/29/2019.  She continued to have episodes of atrial fibrillation and is now status post her third ablation 01/30/2023.  She was initially on dofetilide but continued to have episodes of atrial fibrillation.  Since her most recent ablation her dofetilide has been switched to amiodarone.  Today, denies symptoms of chest pain, orthopnea, PND, lower extremity edema, claudication, dizziness, presyncope, syncope, bleeding, or neurologic sequela. The patient is tolerating medications without difficulties.  She feels tired, fatigued, short of breath.  During her last ablation, she had signal in her left inferior pulmonary vein which required minor ablation as well as along the posterior wall.  She has been finding it difficult to sleep due to her palpitations, and is having trouble with her level of fatigue.   Past Medical History:  Diagnosis Date   Allergic rhinitis    Atrial fibrillation (HCC)    GERD (gastroesophageal reflux disease)    Hyperlipidemia    Hypothyroid    Past Surgical History:  Procedure Laterality Date   ADENOIDECTOMY     APPENDECTOMY     ATRIAL FIBRILLATION ABLATION N/A 01/30/2023   Procedure: ATRIAL FIBRILLATION ABLATION;  Surgeon: Regan Lemming, MD;  Location: [facility] INVASIVE CV LAB;  Service: Cardiovascular;  Laterality: N/A;   HERNIA REPAIR     pulmonary vein abrasion     SHOULDER  ARTHROSCOPY     TONSILLECTOMY       Current Outpatient Medications  Medication Sig Dispense Refill   amiodarone (PACERONE) 200 MG tablet Take 1 tablet (200 mg total) by mouth daily. 90 tablet 2   atorvastatin (LIPITOR) 40 MG tablet Take 1 tablet (40 mg total) by mouth daily. (Patient taking differently: Take 40 mg by mouth every evening.) 90 tablet 3   azelastine (ASTELIN) 0.1 % nasal spray Place 2 sprays into both nostrils 2 (two) times daily. (Patient taking differently: Place 2 sprays into both nostrils daily as needed for allergies.) 30 mL 5   Cholecalciferol (VITAMIN D3) 2000 units capsule Take 2,000 Units by mouth daily.      diltiazem (CARDIZEM CD) 360 MG 24 hr capsule Take 1 capsule (360 mg total) by mouth daily. 90 capsule 3   fexofenadine (ALLEGRA) 180 MG tablet Take 180 mg by mouth daily as needed for allergies.      furosemide (LASIX) 20 MG tablet Take 1 tablet (20 mg total) by mouth daily as needed (swelling). 30 tablet 6   Levothyroxine Sodium 50 MCG CAPS Take 50 mcg by mouth daily before breakfast.     pantoprazole (PROTONIX) 40 MG tablet Take 40 mg by mouth daily.     potassium chloride (KLOR-CON) 10 MEQ tablet Take 2 tablets (20 mEq total) by mouth daily. (Patient taking differently: Take 20 mEq by mouth at bedtime.) 60 tablet 6   rivaroxaban (XARELTO) 20 MG TABS tablet Take 1 tablet (20 mg total) by mouth daily with supper. 90 tablet 1   No  current facility-administered medications for this visit.    Allergies:   Sulfa antibiotics, Amoxicillin, Augmentin [amoxicillin-pot clavulanate], Nsaids, Other, Trazodone, and Latex   Social History:  The patient  reports that she has never smoked. She has never used smokeless tobacco. She reports current alcohol use of about 2.0 standard drinks of alcohol per week. She reports that she does not use drugs.   Family History:  The patient's family history includes Asthma in her mother and sister.   ROS:  Please see the history of  present illness.   Otherwise, review of systems is positive for none.   All other systems are reviewed and negative.   PHYSICAL EXAM: VS:  BP (!) 140/76   Pulse (!) 117   Ht 5\' 9"  (1.753 m)   Wt 183 lb (83 kg)   SpO2 97%   BMI 27.02 kg/m  , BMI Body mass index is 27.02 kg/m. GEN: Well nourished, well developed, in no acute distress  HEENT: normal  Neck: no JVD, carotid bruits, or masses Cardiac: tachycardic, regular; no murmurs, rubs, or gallops,no edema  Respiratory:  clear to auscultation bilaterally, normal work of breathing GI: soft, nontender, nondistended, + BS MS: no deformity or atrophy  Skin: warm and dry Neuro:  Strength and sensation are intact Psych: euthymic mood, full affect  EKG:  EKG is ordered today. Personal review of the ekg ordered shows atrial tachycardia  Recent Labs: 04/06/2023: BUN 15; Creatinine, Ser 1.12; Hemoglobin 13.3; Magnesium 2.0; Platelets 233; Potassium 3.6; Sodium 139    Lipid Panel  No results found for: "CHOL", "TRIG", "HDL", "CHOLHDL", "VLDL", "LDLCALC", "LDLDIRECT"   Wt Readings from Last 3 Encounters:  04/13/23 183 lb (83 kg)  04/06/23 185 lb 6.5 oz (84.1 kg)  02/27/23 183 lb 9.6 oz (83.3 kg)      Other studies Reviewed: Additional studies/ records that were reviewed today include: TEE 01/07/2020 Review of the above records today demonstrates:  The left ventricular size is normal. There is normal left ventricular wall thickness. LV ejection fraction = 60-65%. No segmental wall motion abnormalities seen in the left ventricle The left atrium is moderately dilated. A left-to-right shunt was visualized by color flow Doppler.   ASSESSMENT AND PLAN:  1.  Persistent atrial fibrillation/atrial flutter/atrial tachycardia: Status post ablation x 3, most recently 01/30/2023.  Currently on amiodarone and Xarelto.  CHA2DS2-VASc of 2.  She was having more frequent episodes of atrial fibrillation post ablation and is now on amiodarone.  She  feels quite poorly.  She was recently cardioverted in the emergency room and got 1 dose of amiodarone.  He is unfortunately on back out of rhythm.  She would benefit from continuing amiodarone.  Delmy Holdren plan for a full load and cardioversion.  2.  Hyperlipidemia: Continue atorvastatin per primary physician  3.  Secondary hypercoagulable state: Currently on Xarelto for atrial fibrillation  4.  High risk medication monitoring: Currently on amiodarone.  Recent labs within normal limits.    Current medicines are reviewed at length with the patient today.   The patient does not have concerns regarding her medicines.  The following changes were made today: None  Labs/ tests ordered today include:  Orders Placed This Encounter  Procedures   EKG 12-Lead      Disposition:   FU 1 months  Signed, Maxx Pham Jorja Loa, MD  04/13/2023 1:00 PM     [facility] [address] [place] [address] [phone] (office) [phone] (fax)

## 2023-04-17 ENCOUNTER — Encounter: Payer: Self-pay | Admitting: Cardiology

## 2023-04-23 ENCOUNTER — Ambulatory Visit (HOSPITAL_COMMUNITY)
Admission: RE | Admit: 2023-04-23 | Payer: Medicare (Managed Care) | Source: Home / Self Care | Admitting: Internal Medicine

## 2023-04-23 ENCOUNTER — Encounter: Payer: Self-pay | Admitting: Cardiology

## 2023-04-23 ENCOUNTER — Encounter (HOSPITAL_COMMUNITY): Admission: RE | Payer: Self-pay | Source: Home / Self Care

## 2023-04-23 DIAGNOSIS — I4819 Other persistent atrial fibrillation: Secondary | ICD-10-CM

## 2023-04-23 DIAGNOSIS — I484 Atypical atrial flutter: Secondary | ICD-10-CM

## 2023-04-23 SURGERY — CARDIOVERSION
Anesthesia: Monitor Anesthesia Care

## 2023-04-23 NOTE — Telephone Encounter (Signed)
Called and spoke with patient concerning heart rate. Instructed patient to take amiodarone once daily as prescribed. Patient will continue to monitor heart rate and reach out again if no improvement in heart rate and/or fatigue. No further questions at this time.

## 2023-05-08 ENCOUNTER — Other Ambulatory Visit: Payer: Self-pay

## 2023-05-08 DIAGNOSIS — I480 Paroxysmal atrial fibrillation: Secondary | ICD-10-CM

## 2023-05-08 MED ORDER — RIVAROXABAN 20 MG PO TABS
20.0000 mg | ORAL_TABLET | Freq: Every day | ORAL | 1 refills | Status: DC
Start: 2023-05-08 — End: 2023-11-05

## 2023-05-08 NOTE — Telephone Encounter (Signed)
Prescription refill request for Xarelto received.  Indication:afib Last office visit:4/24 Weight:83 kg Age:77 Scr:1.1 CrCl:57.01  ml/min  Prescription refilled

## 2023-05-09 ENCOUNTER — Ambulatory Visit: Payer: Medicare (Managed Care) | Attending: Cardiology | Admitting: Cardiology

## 2023-05-09 ENCOUNTER — Encounter: Payer: Self-pay | Admitting: Cardiology

## 2023-05-09 VITALS — BP 136/68 | HR 65 | Ht 70.0 in | Wt 186.0 lb

## 2023-05-09 DIAGNOSIS — I4819 Other persistent atrial fibrillation: Secondary | ICD-10-CM

## 2023-05-09 DIAGNOSIS — Z79899 Other long term (current) drug therapy: Secondary | ICD-10-CM

## 2023-05-09 DIAGNOSIS — D6869 Other thrombophilia: Secondary | ICD-10-CM

## 2023-05-09 MED ORDER — DILTIAZEM HCL ER COATED BEADS 180 MG PO CP24: 180.0000 mg | ORAL_CAPSULE | Freq: Every day | ORAL | 2 refills | Status: DC

## 2023-05-09 MED ORDER — AMIODARONE HCL 200 MG PO TABS: ORAL_TABLET | ORAL | 2 refills | Status: DC

## 2023-05-09 NOTE — Progress Notes (Signed)
Electrophysiology Office Note   Date:  05/09/2023   ID:  Ferrah, Mcclernon 30-Apr-1946, MRN [id number]  PCP:  Adolph Pollack, FNP  Cardiologist: Rudolpho Sevin Primary Electrophysiologist:  Seldon Barrell Jorja Loa, MD    Chief Complaint: Second opinion   History of Present Illness: Rebecca Carter is a 77 y.o. female who is being seen today for the evaluation of atrial fibrillation at the request of Wachs, Erika S, DO. Presenting today for electrophysiology evaluation.  She has a history significant for atrial fibrillation and atrial flutter.  She is post cryoablation with repeat ablation 12/29/2019.  She continued to have episodes of atrial fibrillation status post ablation x3 on 01/30/23.  Today, denies symptoms of palpitations, chest pain, shortness of breath, orthopnea, PND, lower extremity edema, claudication, dizziness, presyncope, syncope, bleeding, or neurologic sequela. The patient is tolerating medications without difficulties.  Since being started on amiodarone when she was in the emergency room, she has done well.  She has had no further episodes of atrial fibrillation.  Her only complaint today is mild fatigue.   Past Medical History:  Diagnosis Date   Allergic rhinitis    Atrial fibrillation (HCC)    GERD (gastroesophageal reflux disease)    Hyperlipidemia    Hypothyroid    Past Surgical History:  Procedure Laterality Date   ADENOIDECTOMY     APPENDECTOMY     ATRIAL FIBRILLATION ABLATION N/A 01/30/2023   Procedure: ATRIAL FIBRILLATION ABLATION;  Surgeon: Regan Lemming, MD;  Location: [facility] INV[facility] CV LAB;  Service: Cardiovascular;  Laterality: N/A;   HERNIA REPAIR     pulmonary vein abrasion     SHOULDER ARTHROSCOPY     TONSILLECTOMY       Current Outpatient Medications  Medication Sig Dispense Refill   atorvastatin (LIPITOR) 40 MG tablet Take 1 tablet (40 mg total) by mouth daily. (Patient taking differently: Take 40 mg by mouth every evening.) 90 tablet 3    azelastine (ASTELIN) 0.1 % nasal spray Place 2 sprays into both nostrils 2 (two) times daily. (Patient taking differently: Place 2 sprays into both nostrils daily as needed for allergies.) 30 mL 5   Cholecalciferol (VITAMIN D3) 2000 units capsule Take 2,000 Units by mouth daily.      diltiazem (CARDIZEM CD) 180 MG 24 hr capsule Take 1 capsule (180 mg total) by mouth daily. 90 capsule 2   fexofenadine (ALLEGRA) 180 MG tablet Take 180 mg by mouth daily as needed for allergies.      furosemide (LASIX) 20 MG tablet Take 1 tablet (20 mg total) by mouth daily as needed (swelling). 30 tablet 6   Levothyroxine Sodium 50 MCG CAPS Take 50 mcg by mouth daily before breakfast.     pantoprazole (PROTONIX) 40 MG tablet Take 40 mg by mouth daily.     potassium chloride (KLOR-CON) 10 MEQ tablet Take 2 tablets (20 mEq total) by mouth daily. (Patient taking differently: Take 20 mEq by mouth at bedtime.) 60 tablet 6   rivaroxaban (XARELTO) 20 MG TABS tablet Take 1 tablet (20 mg total) by mouth daily with supper. 90 tablet 1   amiodarone (PACERONE) 200 MG tablet Take 2 tablets (400 mg total) TWICE a day for 2 weeks, then take 1 tablet (200 mg total) TWICE a day for 2 weeks, then take 1 tablet (200 mg total) ONCE a day 90 tablet 2   No current facility-administered medications for this visit.    Allergies:   Sulfa antibiotics, Amoxicillin, Augmentin [amoxicillin-pot clavulanate], Nsaids, Other,  Trazodone, and Latex   Social History:  The patient  reports that she has never smoked. She has never used smokeless tobacco. She reports current alcohol use of about 2.0 standard drinks of alcohol per week. She reports that she does not use drugs.   Family History:  The patient's family history includes Asthma in her mother and sister.   ROS:  Please see the history of present illness.   Otherwise, review of systems is positive for none.   All other systems are reviewed and negative.   PHYSICAL EXAM: VS:  BP 136/68    Pulse 65   Ht 5\' 10"  (1.778 m)   Wt 186 lb (84.4 kg)   SpO2 98%   BMI 26.69 kg/m  , BMI Body mass index is 26.69 kg/m. GEN: Well nourished, well developed, in no acute distress  HEENT: normal  Neck: no JVD, carotid bruits, or masses Cardiac: RRR; no murmurs, rubs, or gallops,no edema  Respiratory:  clear to auscultation bilaterally, normal work of breathing GI: soft, nontender, nondistended, + BS MS: no deformity or atrophy  Skin: warm and dry Neuro:  Strength and sensation are intact Psych: euthymic mood, full affect  EKG:  EKG is ordered today. Personal review of the ekg ordered shows sinus rhythm   Recent Labs: 04/06/2023: BUN 15; Creatinine, Ser 1.12; Hemoglobin 13.3; Magnesium 2.0; Platelets 233; Potassium 3.6; Sodium 139    Lipid Panel  No results found for: "CHOL", "TRIG", "HDL", "CHOLHDL", "VLDL", "LDLCALC", "LDLDIRECT"   Wt Readings from Last 3 Encounters:  05/09/23 186 lb (84.4 kg)  04/13/23 183 lb (83 kg)  04/06/23 185 lb 6.5 oz (84.1 kg)      Other studies Reviewed: Additional studies/ records that were reviewed today include: TEE 01/07/2020 Review of the above records today demonstrates:  The left ventricular size is normal. There is normal left ventricular wall thickness. LV ejection fraction = 60-65%. No segmental wall motion abnormalities seen in the left ventricle The left atrium is moderately dilated. A left-to-right shunt was visualized by color flow Doppler.   ASSESSMENT AND PLAN:  1.  Persistent atrial fibrillation/atrial flutter/atrial tachycardia: Status post ablation x 3, most recently 01/30/2023.  Currently on amiodarone and Xarelto.  CHA2DS2-VASc of 2.  She currently is in sinus rhythm.  She does have some fatigue.  [name] reduce diltiazem to 180 mg daily.  Aside from that we [name] continue with current management.  2.  Hyperlipidemia: Continue atorvastatin per primary physician  3.  Secondary hypercoagulable state: Currently on Xarelto for  atrial fibrillation  4.  High risk medication monitoring: Currently on amiodarone.  [name] check TSH and LFTs today.   Current medicines are reviewed at length with the patient today.   The patient does not have concerns regarding her medicines.  The following changes were made today: Reduce diltiazem  Labs/ tests ordered today include:  Orders Placed This Encounter  Procedures   EKG 12-Lead      Disposition:   FU 6 months  Signed, Lacinda Curvin Jorja Loa, MD  05/09/2023 2:51 PM     [facility] [address] [place] [address] [phone] (office) [phone] (fax)

## 2023-05-09 NOTE — Patient Instructions (Addendum)
Medication Instructions:  Your physician has recommended you make the following change in your medication:  Decrease Diltiazem 180 mg once daily  *If you need a refill on your cardiac medications before your next appointment, please call your pharmacy*   Lab Work: When you go to see your primary doctor later this month, add these labs:   TSH & LFTs If you have labs (blood work) drawn today and your tests are completely normal, you will receive your results only by: MyChart Message (if you have MyChart) OR A paper copy in the mail If you have any lab test that is abnormal or we need to change your treatment, we will call you to review the results.   Testing/Procedures: None ordered   Follow-Up: At [facility], you and your health needs are our priority.  As part of our continuing mission to provide you with exceptional heart care, we have created designated Provider Care Teams.  These Care Teams include your primary Cardiologist (physician) and Advanced Practice Providers (APPs -  Physician Assistants and Nurse Practitioners) who all work together to provide you with the care you need, when you need it.  Your next appointment:   6 month(s) in [facility]  The format for your next appointment:   In Person  Provider:   Loman Brooklyn, MD{   Thank you for choosing [facility]!!   Dory Horn, RN [phone]  Other Instructions

## 2023-07-31 ENCOUNTER — Telehealth: Payer: Self-pay

## 2023-07-31 ENCOUNTER — Encounter: Payer: Self-pay | Admitting: Cardiology

## 2023-07-31 NOTE — Progress Notes (Unsigned)
Cardiology Office Note Date:  07/31/2023  Patient ID:  Rebecca, Carter [birth year], MRN [id number] PCP:  Adolph Pollack, FNP  Electrophysiologist: Dr. Elberta Fortis  ***refresh   Chief Complaint: *** fatigue  History of Present Illness: Rebecca Carter is a 77 y.o. female with history of Hypothyroidism, HLD, AFib.  She saw Dr. Elberta Fortis 05/09/23, c/o fatigue, her dilt dose reduced in effort help it.  Pt called with c/o extreme fatigue, reported HRs 53-60bpm, and BPs 140's/70's and given an appt to come in.  *** check H/H  *** amio labs *** xarelto, labs, dose, bleeding *** amio labs *** brady? Fatigue? Other causes? *** stop dilt *** update echo?    AFib hx/AAD Diagnosed 2017 Cryoablation Re-do ablation 12/29/19 PVI ablation 01/30/23 (here, Dr. Elberta Fortis) Tikosyn May 2021 > stopped Jan 2024, failure to maintain SR Amiodarone started > started Jan 2024  Past Medical History:  Diagnosis Date   Allergic rhinitis    Atrial fibrillation (HCC)    GERD (gastroesophageal reflux disease)    Hyperlipidemia    Hypothyroid     Past Surgical History:  Procedure Laterality Date   ADENOIDECTOMY     APPENDECTOMY     ATRIAL FIBRILLATION ABLATION N/A 01/30/2023   Procedure: ATRIAL FIBRILLATION ABLATION;  Surgeon: Regan Lemming, MD;  Location: [facility];  Service: Cardiovascular;  Laterality: N/A;   HERNIA REPAIR     pulmonary vein abrasion     SHOULDER ARTHROSCOPY     TONSILLECTOMY      Current Outpatient Medications  Medication Sig Dispense Refill   amiodarone (PACERONE) 200 MG tablet Take 2 tablets (400 mg total) TWICE a day for 2 weeks, then take 1 tablet (200 mg total) TWICE a day for 2 weeks, then take 1 tablet (200 mg total) ONCE a day 90 tablet 2   atorvastatin (LIPITOR) 40 MG tablet Take 1 tablet (40 mg total) by mouth daily. (Patient taking differently: Take 40 mg by mouth every evening.) 90 tablet 3   azelastine (ASTELIN) 0.1 % nasal spray Place 2 sprays  into both nostrils 2 (two) times daily. (Patient taking differently: Place 2 sprays into both nostrils daily as needed for allergies.) 30 mL 5   Cholecalciferol (VITAMIN D3) 2000 units capsule Take 2,000 Units by mouth daily.      diltiazem (CARDIZEM CD) 180 MG 24 hr capsule Take 1 capsule (180 mg total) by mouth daily. 90 capsule 2   fexofenadine (ALLEGRA) 180 MG tablet Take 180 mg by mouth daily as needed for allergies.      furosemide (LASIX) 20 MG tablet Take 1 tablet (20 mg total) by mouth daily as needed (swelling). 30 tablet 6   Levothyroxine Sodium 50 MCG CAPS Take 50 mcg by mouth daily before breakfast.     pantoprazole (PROTONIX) 40 MG tablet Take 40 mg by mouth daily.     potassium chloride (KLOR-CON) 10 MEQ tablet Take 2 tablets (20 mEq total) by mouth daily. (Patient taking differently: Take 20 mEq by mouth at bedtime.) 60 tablet 6   rivaroxaban (XARELTO) 20 MG TABS tablet Take 1 tablet (20 mg total) by mouth daily with supper. 90 tablet 1   No current facility-administered medications for this visit.    Allergies:   Sulfa antibiotics, Amoxicillin, Augmentin [amoxicillin-pot clavulanate], Nsaids, Other, Trazodone, and Latex   Social History:  The patient  reports that she has never smoked. She has never used smokeless tobacco. She reports current alcohol use of about 2.0  standard drinks of alcohol per week. She reports that she does not use drugs.   Family History:  The patient's family history includes Asthma in her mother and sister.  ROS:  Please see the history of present illness.    All other systems are reviewed and otherwise negative.   PHYSICAL EXAM:  VS:  There were no vitals taken for this visit. BMI: There is no height or weight on file to calculate BMI. Well nourished, well developed, in no acute distress HEENT: normocephalic, atraumatic Neck: no JVD, carotid bruits or masses Cardiac:  *** RRR; no significant murmurs, no rubs, or gallops Lungs:  *** CTA b/l, no  wheezing, rhonchi or rales Abd: soft, nontender MS: no deformity or *** atrophy Ext: *** no edema Skin: warm and dry, no rash Neuro:  No gross deficits appreciated Psych: euthymic mood, full affect    EKG:  Done today and reviewed by myself shows        01/30/23: EPS/ablation CONCLUSIONS: 1.  Sinus rhythm upon presentation.   2. Successful electrical isolation and anatomical encircling of left inferior pulmonary vein with radiofrequency current.  A WACA approach was used 3. Additional left atrial ablation was performed with along the posterior wall of the left atrium 4.  No early apparent complications.   01/06/20: limited TTE SUMMARY  The left ventricular size is normal.  There is normal left ventricular wall thickness.  LV ejection fraction = 60-65%.  No segmental wall motion abnormalities seen in the left ventricle  The left atrium is moderately dilated.  A left-to-right shunt was visualized by color flow Doppler. Status  post ablation on 12-29-2019.  Pericardial fat pad is noted.   Recent Labs: 04/06/2023: BUN 15; Creatinine, Ser 1.12; Hemoglobin 13.3; Magnesium 2.0; Platelets 233; Potassium 3.6; Sodium 139  No results found for requested labs within last 365 days.   CrCl cannot be calculated (Patient's most recent lab result is older than the maximum 21 days allowed.).   Wt Readings from Last 3 Encounters:  05/09/23 186 lb (84.4 kg)  04/13/23 183 lb (83 kg)  04/06/23 185 lb 6.5 oz (84.1 kg)     Other studies reviewed: Additional studies/records reviewed today include: summarized above  ASSESSMENT AND PLAN:  Persistent AFib CHA2DS2Vasc is 3, on Xarelto, *** appropriately dosed ***  Secondary hypercoagulable state     Disposition: F/u with ***  Current medicines are reviewed at length with the patient today.  The patient did not have any concerns regarding medicines.  Rebecca Fredrickson, PA-C 07/31/2023 2:14 PM     [facility] [address] [place] [address] [phone] (office)  [phone] (fax)

## 2023-07-31 NOTE — Telephone Encounter (Signed)
Called patient to confirm she can make 8 am appt w/ Francis Dowse on 08/02/23. Patient confirms she can make visit, appt scheduled.

## 2023-08-02 ENCOUNTER — Other Ambulatory Visit: Payer: Self-pay | Admitting: Physician Assistant

## 2023-08-02 ENCOUNTER — Encounter: Payer: Self-pay | Admitting: Physician Assistant

## 2023-08-02 ENCOUNTER — Ambulatory Visit: Payer: Medicare (Managed Care) | Attending: Physician Assistant | Admitting: Physician Assistant

## 2023-08-02 ENCOUNTER — Ambulatory Visit: Payer: Medicare (Managed Care) | Attending: Physician Assistant

## 2023-08-02 VITALS — BP 134/62 | HR 55 | Ht 70.0 in | Wt 183.2 lb

## 2023-08-02 DIAGNOSIS — R001 Bradycardia, unspecified: Secondary | ICD-10-CM

## 2023-08-02 DIAGNOSIS — Z9889 Other specified postprocedural states: Secondary | ICD-10-CM

## 2023-08-02 DIAGNOSIS — D6869 Other thrombophilia: Secondary | ICD-10-CM | POA: Diagnosis not present

## 2023-08-02 DIAGNOSIS — R5383 Other fatigue: Secondary | ICD-10-CM

## 2023-08-02 DIAGNOSIS — Z8679 Personal history of other diseases of the circulatory system: Secondary | ICD-10-CM

## 2023-08-02 DIAGNOSIS — I4819 Other persistent atrial fibrillation: Secondary | ICD-10-CM

## 2023-08-02 MED ORDER — AMIODARONE HCL 200 MG PO TABS: 200.0000 mg | ORAL_TABLET | ORAL | 3 refills | Status: DC

## 2023-08-02 NOTE — Progress Notes (Unsigned)
Enrolled for [employer] to mail a ZIO XT long term holter monitor to the patients address on file.   Dr. Camnitz to read. 

## 2023-08-02 NOTE — Patient Instructions (Addendum)
Medication Instructions:   START TAKING:  AMIODARONE 200 MG EVERY OTHER DAY    *If you need a refill on your cardiac medications before your next appointment, please call your pharmacy*   Lab Work:  BMET AND CBC TODAY   If you have labs (blood work) drawn today and your tests are completely normal, you will receive your results only by: MyChart Message (if you have MyChart) OR A paper copy in the mail If you have any lab test that is abnormal or we need to change your treatment, we will call you to review the results.   Testing/Procedures: Your physician has recommended that you wear an event monitor. Event monitors are medical devices that record the heart's electrical activity. Doctors most often [place] these monitors to diagnose arrhythmias. Arrhythmias are problems with the speed or rhythm of the heartbeat. The monitor is a small, portable device. You can wear one while you do your normal daily activities. This is usually used to diagnose what is causing palpitations/syncope (passing out).\    Follow-Up: At [facility], you and your health needs are our priority.  As part of our continuing mission to provide you with exceptional heart care, we have created designated Provider Care Teams.  These Care Teams include your primary Cardiologist (physician) and Advanced Practice Providers (APPs -  Physician Assistants and Nurse Practitioners) who all work together to provide you with the care you need, when you need it.  We recommend signing up for the patient portal called "MyChart".  Sign up information is provided on this After Visit Summary.  MyChart is used to connect with patients for Virtual Visits (Telemedicine).  Patients are able to view lab/test results, encounter notes, upcoming appointments, etc.  Non-urgent messages can be sent to your provider as well.   To learn more about what you can do with MyChart, go to [email or web address].    Your next appointment:   2  month(s)  Provider:   Francis Dowse, PA-C    Other Instructions  Rebecca Carter- Long Term Monitor Instructions  Your physician has requested you wear a ZIO patch monitor for  3 days.  This is a single patch monitor. [employer] supplies one patch monitor per enrollment. Additional stickers are not available. Please do not apply patch if you will be having a Nuclear Stress Test,  Echocardiogram, Cardiac CT, MRI, or Chest Xray during the period you would be wearing the  monitor. The patch cannot be worn during these tests. You cannot remove and re-apply the  ZIO XT patch monitor.  Your ZIO patch monitor will be mailed 3 day USPS to your address on file. It may take 3-5 days  to receive your monitor after you have been enrolled.  Once you have received your monitor, please review the enclosed instructions. Your monitor  has already been registered assigning a specific monitor serial # to you.  Billing and Patient Assistance Program Information  We have supplied [employer] with any of your insurance information on file for billing purposes. [employer] offers a sliding scale Patient Assistance Program for patients that do not have  insurance, or whose insurance does not completely cover the cost of the ZIO monitor.  You must apply for the Patient Assistance Program to qualify for this discounted rate.  To apply, please call [employer] at [phone], select option 4, select option 2, ask to apply for  Patient Assistance Program. Meredeth Ide will ask your household income, and how many people  are in your household.  They will quote your out-of-pocket cost based on that information.  [employer] will also be able to set up a 36-month, interest-free payment plan if needed.  Applying the monitor   Shave hair from upper left chest.  Hold abrader disc by orange tab. Rub abrader in 40 strokes over the upper left chest as  indicated in your monitor instructions.  Clean area with 4 enclosed alcohol pads. Let dry.  Apply  patch as indicated in monitor instructions. Patch will be placed under collarbone on left  side of chest with arrow pointing upward.  Rub patch adhesive wings for 2 minutes. Remove white label marked "1". Remove the white  label marked "2". Rub patch adhesive wings for 2 additional minutes.  While looking in a mirror, press and release button in center of patch. A small green light will  flash 3-4 times. This will be your only indicator that the monitor has been turned on.  Do not shower for the first 24 hours. You may shower after the first 24 hours.  Press the button if you feel a symptom. You will hear a small click. Record Date, Time and  Symptom in the Patient Logbook.  When you are ready to remove the patch, follow instructions on the last 2 pages of Patient  Logbook. Stick patch monitor onto the last page of Patient Logbook.  Place Patient Logbook in the blue and white box. Use locking tab on box and tape box closed  securely. The blue and white box has prepaid postage on it. Please place it in the mailbox as  soon as possible. Your physician should have your test results approximately 7 days after the  monitor has been mailed back to [facility].  Call [facility] Customer Care at [phone] if you have questions regarding  your ZIO XT patch monitor. Call them immediately if you see an orange light blinking on your  monitor.  If your monitor falls off in less than 4 days, contact our Monitor department at [phone].  If your monitor becomes loose or falls off after 4 days call [employer] at [phone] for  suggestions on securing your monitor

## 2023-08-03 LAB — BASIC METABOLIC PANEL
BUN/Creatinine Ratio: 19 (ref 12–28)
BUN: 22 mg/dL (ref 8–27)
CO2: 22 mmol/L (ref 20–29)
Calcium: 9.2 mg/dL (ref 8.7–10.3)
Chloride: 105 mmol/L (ref 96–106)
Creatinine, Ser: 1.14 mg/dL — ABNORMAL HIGH (ref 0.57–1.00)
Glucose: 102 mg/dL — ABNORMAL HIGH (ref 70–99)
Potassium: 4.5 mmol/L (ref 3.5–5.2)
Sodium: 141 mmol/L (ref 134–144)
eGFR: 50 mL/min/{1.73_m2} — ABNORMAL LOW (ref >59)

## 2023-08-03 LAB — CBC
Hematocrit: 39.4 % (ref 34.0–46.6)
Hemoglobin: 13.1 g/dL (ref 11.1–15.9)
MCH: 31.2 pg (ref 26.6–33.0)
MCHC: 33.2 g/dL (ref 31.5–35.7)
MCV: 94 fL (ref 79–97)
Platelets: 228 10*3/uL (ref 150–450)
RBC: 4.2 x10E6/uL (ref 3.77–5.28)
RDW: 13.9 % (ref 11.7–15.4)
WBC: 6.3 10*3/uL (ref 3.4–10.8)

## 2023-08-04 DIAGNOSIS — R001 Bradycardia, unspecified: Secondary | ICD-10-CM | POA: Diagnosis not present

## 2023-08-04 DIAGNOSIS — Z91040 Latex allergy status: Secondary | ICD-10-CM | POA: Diagnosis not present

## 2023-08-04 DIAGNOSIS — R5383 Other fatigue: Secondary | ICD-10-CM

## 2023-08-04 DIAGNOSIS — N3001 Acute cystitis with hematuria: Secondary | ICD-10-CM | POA: Diagnosis not present

## 2023-08-04 DIAGNOSIS — Z79899 Other long term (current) drug therapy: Secondary | ICD-10-CM | POA: Diagnosis not present

## 2023-08-04 DIAGNOSIS — Z7901 Long term (current) use of anticoagulants: Secondary | ICD-10-CM | POA: Diagnosis not present

## 2023-08-04 DIAGNOSIS — Z9889 Other specified postprocedural states: Secondary | ICD-10-CM | POA: Diagnosis not present

## 2023-08-04 DIAGNOSIS — Z8679 Personal history of other diseases of the circulatory system: Secondary | ICD-10-CM

## 2023-08-04 DIAGNOSIS — R1030 Lower abdominal pain, unspecified: Secondary | ICD-10-CM | POA: Diagnosis present

## 2023-08-05 ENCOUNTER — Other Ambulatory Visit: Payer: Self-pay

## 2023-08-05 ENCOUNTER — Other Ambulatory Visit: Payer: Self-pay | Admitting: Family Medicine

## 2023-08-05 ENCOUNTER — Emergency Department (HOSPITAL_BASED_OUTPATIENT_CLINIC_OR_DEPARTMENT_OTHER): Payer: Medicare (Managed Care)

## 2023-08-05 ENCOUNTER — Encounter (HOSPITAL_BASED_OUTPATIENT_CLINIC_OR_DEPARTMENT_OTHER): Payer: Self-pay | Admitting: Emergency Medicine

## 2023-08-05 ENCOUNTER — Emergency Department (HOSPITAL_BASED_OUTPATIENT_CLINIC_OR_DEPARTMENT_OTHER)
Admission: EM | Admit: 2023-08-05 | Discharge: 2023-08-05 | Disposition: A | Discharge status: Home / Self Care | Payer: Medicare (Managed Care) | Attending: Emergency Medicine | Admitting: Emergency Medicine

## 2023-08-05 DIAGNOSIS — N3001 Acute cystitis with hematuria: Secondary | ICD-10-CM

## 2023-08-05 LAB — BASIC METABOLIC PANEL
Anion gap: 10 (ref 5–15)
BUN: 21 mg/dL (ref 8–23)
CO2: 24 mmol/L (ref 22–32)
Calcium: 9 mg/dL (ref 8.9–10.3)
Chloride: 106 mmol/L (ref 98–111)
Creatinine, Ser: 1.01 mg/dL — ABNORMAL HIGH (ref 0.44–1.00)
GFR, Estimated: 57 mL/min — ABNORMAL LOW (ref >60)
Glucose, Bld: 111 mg/dL — ABNORMAL HIGH (ref 70–99)
Potassium: 3.6 mmol/L (ref 3.5–5.1)
Sodium: 140 mmol/L (ref 135–145)

## 2023-08-05 LAB — URINALYSIS, MICROSCOPIC (REFLEX): Bacteria, UA: NONE SEEN

## 2023-08-05 LAB — URINALYSIS, ROUTINE W REFLEX MICROSCOPIC
Bilirubin Urine: NEGATIVE
Glucose, UA: NEGATIVE mg/dL
Ketones, ur: NEGATIVE mg/dL
Nitrite: POSITIVE — AB
Protein, ur: 30 mg/dL — AB
Specific Gravity, Urine: 1.02 (ref 1.005–1.030)
pH: 5.5 (ref 5.0–8.0)

## 2023-08-05 LAB — CBC WITH DIFFERENTIAL/PLATELET
Abs Immature Granulocytes: 0.02 10*3/uL (ref 0.00–0.07)
Basophils Absolute: 0 10*3/uL (ref 0.0–0.1)
Basophils Relative: 0 %
Eosinophils Absolute: 0.1 10*3/uL (ref 0.0–0.5)
Eosinophils Relative: 1 %
HCT: 38 % (ref 36.0–46.0)
Hemoglobin: 12.6 g/dL (ref 12.0–15.0)
Immature Granulocytes: 0 %
Lymphocytes Relative: 19 %
Lymphs Abs: 1.3 10*3/uL (ref 0.7–4.0)
MCH: 31.2 pg (ref 26.0–34.0)
MCHC: 33.2 g/dL (ref 30.0–36.0)
MCV: 94.1 fL (ref 80.0–100.0)
Monocytes Absolute: 0.7 10*3/uL (ref 0.1–1.0)
Monocytes Relative: 11 %
Neutro Abs: 4.8 10*3/uL (ref 1.7–7.7)
Neutrophils Relative %: 69 %
Platelets: 210 10*3/uL (ref 150–400)
RBC: 4.04 MIL/uL (ref 3.87–5.11)
RDW: 14.1 % (ref 11.5–15.5)
WBC: 7 10*3/uL (ref 4.0–10.5)
nRBC: 0 % (ref 0.0–0.2)

## 2023-08-05 LAB — HEPATIC FUNCTION PANEL
ALT: 15 U/L (ref 0–44)
AST: 16 U/L (ref 15–41)
Albumin: 3.8 g/dL (ref 3.5–5.0)
Alkaline Phosphatase: 92 U/L (ref 38–126)
Bilirubin, Direct: 0.1 mg/dL (ref 0.0–0.2)
Indirect Bilirubin: 0.5 mg/dL (ref 0.3–0.9)
Total Bilirubin: 0.6 mg/dL (ref 0.3–1.2)
Total Protein: 6.7 g/dL (ref 6.5–8.1)

## 2023-08-05 LAB — LIPASE, BLOOD: Lipase: 39 U/L (ref 11–51)

## 2023-08-05 MED ORDER — ACETAMINOPHEN 325 MG PO TABS
650.0000 mg | ORAL_TABLET | Freq: Once | ORAL | Status: AC
  Administered 2023-08-05: 650 mg via ORAL
  Filled 2023-08-05: qty 2

## 2023-08-05 MED ORDER — SODIUM CHLORIDE 0.9 % IV SOLN
1.0000 g | Freq: Once | INTRAVENOUS | Status: AC
  Administered 2023-08-05: 1 g via INTRAVENOUS
  Filled 2023-08-05: qty 10

## 2023-08-05 MED ORDER — SODIUM CHLORIDE 0.9 % IV BOLUS
1000.0000 mL | Freq: Once | INTRAVENOUS | Status: AC
  Administered 2023-08-05: 1000 mL via INTRAVENOUS

## 2023-08-05 MED ORDER — CEPHALEXIN 500 MG PO CAPS: 500.0000 mg | ORAL_CAPSULE | Freq: Three times a day (TID) | ORAL | 0 refills | Status: DC

## 2023-08-05 NOTE — ED Notes (Signed)
Patient returned from CT via stretcher.

## 2023-08-05 NOTE — ED Notes (Signed)
ED Provider at bedside. 

## 2023-08-05 NOTE — ED Triage Notes (Signed)
Burning with urination, lower pelvic pain and lower back pain x 2 - 3 days. Concerned for UTI. Denies n/v/d, fevers.

## 2023-08-05 NOTE — ED Provider Notes (Signed)
[facility] Provider Note   CSN: [id number] Arrival date & time: 08/04/23  2357     History  Chief Complaint  Patient presents with   Female GU Problem    Rebecca Carter is a 77 y.o. female.  Patient with a history of atrial fibrillation on amiodarone as well as Xarelto here with lower abdominal pain and low back pain.  States she has had dysuria for the past 2 to 3 days.  This evening she developed lower back pain as well is suprapubic abdominal pain that is constant progressive worsening over the past several hours.  No radiation of the pain down her buttocks or down her legs.  Still having frequency, urgency and burning with urination.  No hematuria.  Concerned she could have UTI.  No history of same.  No fever, chills, nausea, vomiting, diarrhea or constipation.  No chest pain or shortness of breath.  States her doctor recently told her her GFR was elevated and her amiodarone dose was reduced. No history of kidney stones.  The history is provided by the patient.  Female GU Problem Associated symptoms include abdominal pain. Pertinent negatives include no headaches and no shortness of breath.       Home Medications Prior to Admission medications   Medication Sig Start Date End Date Taking? Authorizing Provider  amiodarone (PACERONE) 200 MG tablet Take 1 tablet (200 mg total) by mouth every other day. T 08/02/23   Sheilah Pigeon, PA-C  atorvastatin (LIPITOR) 40 MG tablet Take 1 tablet (40 mg total) by mouth daily. Patient taking differently: Take 40 mg by mouth every evening. 08/14/22   Charlton Amor, DO  azelastine (ASTELIN) 0.1 % nasal spray Place 2 sprays into both nostrils 2 (two) times daily. Patient taking differently: Place 2 sprays into both nostrils daily as needed for allergies. 01/29/19   Bobbitt, Heywood Iles, MD  Cholecalciferol (VITAMIN D3) 2000 units capsule Take 2,000 Units by mouth daily.     [provider]   diltiazem (CARDIZEM CD) 180 MG 24 hr capsule Take 1 capsule (180 mg total) by mouth daily. 05/09/23 08/07/23  Camnitz, Andree Coss, MD  fexofenadine (ALLEGRA) 180 MG tablet Take 180 mg by mouth daily as needed for allergies.  02/04/16   [provider]  furosemide (LASIX) 20 MG tablet Take 1 tablet (20 mg total) by mouth daily as needed (swelling). 03/23/23   Camnitz, Andree Coss, MD  Levothyroxine Sodium 50 MCG CAPS Take 50 mcg by mouth daily before breakfast.    [provider]  potassium chloride (KLOR-CON) 10 MEQ tablet Take 2 tablets (20 mEq total) by mouth daily. Patient taking differently: Take 20 mEq by mouth at bedtime. 02/28/23   Fenton, Clint R, PA  rivaroxaban (XARELTO) 20 MG TABS tablet Take 1 tablet (20 mg total) by mouth daily with supper. 05/08/23   Camnitz, Andree Coss, MD      Allergies    Sulfa antibiotics, Amoxicillin, Augmentin [amoxicillin-pot clavulanate], Nsaids, Other, Trazodone, and Latex    Review of Systems   Review of Systems  Constitutional:  Negative for activity change, appetite change and fever.  HENT:  Negative for congestion and rhinorrhea.   Respiratory:  Negative for cough and shortness of breath.   Gastrointestinal:  Positive for abdominal pain. Negative for nausea and vomiting.  Genitourinary:  Positive for dysuria, frequency, pelvic pain, urgency and vaginal bleeding. Negative for hematuria and vaginal discharge.  Musculoskeletal:  Positive for back pain.  Skin:  Negative for rash.  Neurological:  Negative for weakness and headaches.   all other systems are negative except as noted in the HPI and PMH.    Physical Exam Updated Vital Signs BP (!) 168/67 (BP Location: Right Arm)   Pulse 79   Temp 98.4 F (36.9 C) (Oral)   Resp 17   Ht 5\' 10"  (1.778 m)   Wt 83 kg   SpO2 95%   BMI 26.26 kg/m  Physical Exam Vitals and nursing note reviewed.  Constitutional:      General: She is not in acute distress.    Appearance: She is  well-developed.  HENT:     Head: Normocephalic and atraumatic.     Mouth/Throat:     Pharynx: No oropharyngeal exudate.  Eyes:     Conjunctiva/sclera: Conjunctivae normal.     Pupils: Pupils are equal, round, and reactive to light.  Neck:     Comments: No meningismus. Cardiovascular:     Rate and Rhythm: Normal rate and regular rhythm.     Heart sounds: Normal heart sounds. No murmur heard. Pulmonary:     Effort: Pulmonary effort is normal. No respiratory distress.     Breath sounds: Normal breath sounds.  Abdominal:     Palpations: Abdomen is soft.     Tenderness: There is abdominal tenderness. There is no guarding or rebound.     Comments: Suprapubic tenderness, no right lower quadrant tenderness  Musculoskeletal:        General: Tenderness present. Normal range of motion.     Cervical back: Normal range of motion and neck supple.     Comments: Paraspinal lumbar tenderness bilaterally, no CVA tenderness  Skin:    General: Skin is warm.  Neurological:     Mental Status: She is alert and oriented to person, place, and time.     Cranial Nerves: No cranial nerve deficit.     Motor: No abnormal muscle tone.     Coordination: Coordination normal.     Comments:  5/5 strength throughout. CN 2-12 intact.Equal grip strength.   Psychiatric:        Behavior: Behavior normal.     ED Results / Procedures / Treatments   Labs (all labs ordered are listed, but only abnormal results are displayed) Labs Reviewed  URINALYSIS, ROUTINE W REFLEX MICROSCOPIC - Abnormal; Notable for the following components:      Result Value   APPearance HAZY (*)    Hgb urine dipstick LARGE (*)    Protein, ur 30 (*)    Nitrite POSITIVE (*)    Leukocytes,Ua TRACE (*)    All other components within normal limits  BASIC METABOLIC PANEL - Abnormal; Notable for the following components:   Glucose, Bld 111 (*)    Creatinine, Ser 1.01 (*)    GFR, Estimated 57 (*)    All other components within normal limits   URINE CULTURE  CBC WITH DIFFERENTIAL/PLATELET  HEPATIC FUNCTION PANEL  LIPASE, BLOOD  URINALYSIS, MICROSCOPIC (REFLEX)    EKG None  Radiology CT Renal Stone Study  Result Date: 08/05/2023 CLINICAL DATA:  Abdominal/flank pain, stone suspected EXAM: CT ABDOMEN AND PELVIS WITHOUT CONTRAST TECHNIQUE: Multidetector CT imaging of the abdomen and pelvis was performed following the standard protocol without IV contrast. RADIATION DOSE REDUCTION: This exam was performed according to the departmental dose-optimization program which includes automated exposure control, adjustment of the mA and/or kV according to patient size and/or use of iterative reconstruction technique. COMPARISON:  CT chest  12/26/2019 FINDINGS: Lower chest: No acute abnormality. Hepatobiliary: Cholelithiasis without pericholecystic inflammatory change. Stable cysts within segment 8 of the liver. No definite solid intrahepatic mass on this noncontrast examination. Liver otherwise unremarkable. No intra or extrahepatic biliary ductal dilation. Pancreas: Unremarkable Spleen: Unremarkable Adrenals/Urinary Tract: The adrenal glands are unremarkable. The kidneys are normal in size and position. No hydronephrosis. No intrarenal or ureteral calculi. No perinephric fluid collections. The bladder is unremarkable. Stomach/Bowel: Status post appendectomy. Mild sigmoid diverticulosis. Stomach, small bowel, and large bowel are otherwise unremarkable. No evidence of obstruction or focal inflammation. No free intraperitoneal gas or fluid. Vascular/Lymphatic: Aortic atherosclerosis. No enlarged abdominal or pelvic lymph nodes. Reproductive: Lobulated appearance of the uterus likely relates to underlying uterine fibroids. The pelvic organs are otherwise unremarkable on this noncontrast examination. Other: Tiny fat containing umbilical hernia. Musculoskeletal: No acute bone abnormality. No lytic or blastic bone lesion. IMPRESSION: 1. No acute intra-abdominal  pathology identified. No nephro or urolithiasis. No definite radiographic explanation for the patient's reported symptoms. 2. Cholelithiasis. 3. Mild sigmoid diverticulosis. 4. Fibroid uterus Aortic Atherosclerosis (ICD10-I70.0). Electronically Signed   By: Helyn Numbers M.D.   On: 08/05/2023 01:03    Procedures Procedures    Medications Ordered in ED Medications  sodium chloride 0.9 % bolus 1,000 mL (has no administration in time range)  acetaminophen (TYLENOL) tablet 650 mg (has no administration in time range)    ED Course/ Medical Decision Making/ A&P                                 Medical Decision Making Amount and/or Complexity of Data Reviewed Labs: ordered. Decision-making details documented in ED Course. Radiology: ordered and independent interpretation performed. Decision-making details documented in ED Course. ECG/medicine tests: ordered and independent interpretation performed. Decision-making details documented in ED Course.  Risk OTC drugs.   Lower abdominal pain and back pain with urinary symptoms.  Stable vitals, no distress.  Abdomen soft without peritoneal signs.  Patient given IV fluids.  Urinalysis is concerning for infection with positive nitrite and blood.  Will send for culture.  Labs are reassuring.  CT scan is obtained to rule out kidney stone or other obstructive pathology.  CT scan as above.  No acute findings.  Results reviewed interpreted by me.  No nephrolithiasis or ureterolithiasis.  Does have a fibroid uterus and diverticulosis. No AAA.  Will treat for suspected UTI.  IV Rocephin given while culture is pending.  Patient feels improved.  Abdomen is soft without peritoneal signs.  Discussed antibiotic, p.o. hydration and PCP follow-up.        Final Clinical Impression(s) / ED Diagnoses Final diagnoses:  None    Rx / DC Orders ED Discharge Orders     None         Cathey Fredenburg, Jeannett Senior, MD 08/05/23 [phone]

## 2023-08-05 NOTE — Discharge Instructions (Signed)
Take the antibiotic as prescribed.  Follow-up with your doctor.  You will be called if your urine culture grows something and the antibiotic needs to be changed.  Keep yourself hydrated.  Return to the ED with worsening pain, fever, vomiting, unable to urinate or any other concerns.

## 2023-08-05 NOTE — ED Notes (Signed)
Patient transported to CT 

## 2023-08-07 LAB — URINE CULTURE: Culture: >=100000 — AB

## 2023-08-08 ENCOUNTER — Telehealth (HOSPITAL_BASED_OUTPATIENT_CLINIC_OR_DEPARTMENT_OTHER): Payer: Self-pay

## 2023-08-08 NOTE — Telephone Encounter (Signed)
Post ED Visit - Positive Culture Follow-up  Culture report reviewed by antimicrobial stewardship pharmacist: [facility] []  Enzo Bi, Pharm.D. []  Celedonio Miyamoto, Pharm.D., BCPS AQ-ID []  Garvin Fila, Pharm.D., BCPS []  Georgina Pillion, Pharm.D., BCPS []  [place], [address].D., BCPS, AAHIVP []  Estella Husk, Pharm.D., BCPS, AAHIVP [x]  Lysle Pearl, PharmD, BCPS []  Phillips Climes, PharmD, BCPS []  Agapito Games, PharmD, BCPS []  Verlan Friends, PharmD []  Mervyn Gay, PharmD, BCPS []  Vinnie Level, PharmD  [facility] []  Len Childs, PharmD []  Greer Pickerel, PharmD []  Adalberto Cole, PharmD []  Perlie Gold, Rph []  Lonell Face) Jean Rosenthal, PharmD []  Earl Many, PharmD []  Junita Push, PharmD []  Dorna Leitz, PharmD []  Terrilee Files, PharmD []  Lynann Beaver, PharmD []  Keturah Barre, PharmD []  Loralee Pacas, PharmD []  Bernadene Person, PharmD   Positive urine culture Treated with Cephalexin, organism sensitive to the same and no further patient follow-up is required at this time.  Sandria Senter 08/08/2023, 11:21 AM

## 2023-08-13 ENCOUNTER — Other Ambulatory Visit: Payer: Self-pay | Admitting: *Deleted

## 2023-08-13 MED ORDER — DILTIAZEM HCL 30 MG PO TABS: 30.0000 mg | ORAL_TABLET | ORAL | 0 refills | Status: DC | PRN

## 2023-09-23 ENCOUNTER — Other Ambulatory Visit (HOSPITAL_COMMUNITY): Payer: Self-pay | Admitting: Physician Assistant

## 2023-10-06 NOTE — Progress Notes (Unsigned)
Electrophysiology Office Note:   Date:  10/08/2023  ID:  Rebecca Carter, DOB 1946/07/07, MRN [id number]  Primary Cardiologist: None Electrophysiologist: Will Jorja Loa, MD      History of Present Illness:   Rebecca Carter is a 77 y.o. female with h/o persistent AF, HLD, hypothyroidism seen today for routine electrophysiology followup.   EP Clinic visit 08/02/2023 noted her to have low energy / tired all the time despite reduction of diltiazem. She was seen 08/05/23 in ER for UTI with positive culture for klebsiella and was treated with cephalexin.   Since last being seen in our clinic the patient reports she feels better after having reduced her amiodarone, stopped scheduled cardizem.  She has been caring for her husband.  She reports she is not active.  In recent weeks has been bothered by a sensation of her heart pounding in her chest.  She describes it to be like driving over the lane departure grid in the road.  It is so bad that at night she feels she can not get comfortable on her pillow because of the impulse in her chest. Never pain but an "un-nerving feeling".    She denies chest pain, palpitations, dyspnea, PND, orthopnea, nausea, vomiting, dizziness, syncope, edema, weight gain, or early satiety.   Review of systems complete and found to be negative unless listed in HPI.   EP Information / Studies Reviewed:    EKG is ordered today. Personal review as below.  EKG Interpretation Date/Time:  Monday October 08 2023 13:43:43 EDT Ventricular Rate:  60 PR Interval:  178 QRS Duration:  88 QT Interval:  406 QTC Calculation: 406 R Axis:   -59  Text Interpretation: Sinus rhythm with marked sinus arrhythmia with occasional Premature ventricular complexes Left anterior fascicular block Confirmed by Canary Brim ([id number]) on 10/08/2023 2:25:56 PM   Studies:  Limited ECHO 12/2019 > LVEF 60-65% EP Study 01/30/23 >  Sinus rhythm upon presentation. Successful electrical isolation and  anatomical encircling of left inferior pulmonary vein with radiofrequency current.  A WACA approach was used. Additional left atrial ablation was performed with along the posterior wall of the left atrium. No early apparent complications. LTM 07/2023 > no AF, few episodes of short fast rates, some brady episodes but ave HR in 60's   Arrhythmia / AAD Dx 2017  Cryoablation  Re-do Ablation 12/29/2019  PVI Ablation 01/30/23  Tikosyn 04/2020-12/2022, failure to maintain SR  DCCV 03/2023 with NSR  Amiodarone 04/2023 > reduced 07/2023 to 200mg  every other day with fatigue Diltiazem 07/2023 > changed to PRN only for palpitations   Risk Assessment/Calculations:    CHA2DS2-VASc Score = 4   This indicates a 4.8% annual risk of stroke. The patient's score is based upon: CHF History: 0 HTN History: 0 Diabetes History: 0 Stroke History: 0 Vascular Disease History: 1 (aortic atherosclerosis) Age Score: 2 Gender Score: 1             Physical Exam:   VS:  BP 138/62   Ht 5\' 10"  (1.778 m)   Wt 180 lb (81.6 kg)   BMI 25.83 kg/m    Wt Readings from Last 3 Encounters:  10/08/23 180 lb (81.6 kg)  08/05/23 183 lb (83 kg)  08/02/23 183 lb 3.2 oz (83.1 kg)     GEN: Well nourished, well developed in no acute distress NECK: No JVD; No carotid bruits CARDIAC: Regular rate and rhythm, no appreciable murmurs, rubs, gallops RESPIRATORY:  Clear to auscultation without rales, wheezing or rhonchi  ABDOMEN: Soft, non-tender, non-distended EXTREMITIES:  No edema; No deformity   ASSESSMENT AND PLAN:    Persistent Atrial Fibrillation  S/p multiple ablations, DCCV in 03/2023 with NSR. CHA2DS2-VASc 4 / 4.8% annual risk of CVA -EKG with NSR, PAC's/PVC's   -continue amiodarone 200 mg every other day (feels fatigue improved on dose) -amiodarone labs up to date > CMP 07/2023 with normal LFT's, TSH 06/2023 3.674  -asking about coming off amiodarone > reviewed prior ablation hx, discussed possibility of pulse field  ablation, other alternative would be to come off amio all together and consider pulse field ablation if AF returns -diltiazem 30mg  Q4 PRN palpitations   -no known AF currently  Secondary Hypercoagulable State -continue Xarelto, cr cl 61 based on last wt of 183 / cr 1.01  Cardiac Awareness  "Forceful" cardiac impulse per pt, no pain  -update ECHO to ensure no structural abnormalities / valvular changes -discussed adequate hydration   Recent Klebsiella UTI  -per GYN/Urology   Follow up with Dr. Elberta Fortis  on 11/26/23 as scheduled   Signed, Canary Brim, MSN, APRN, NP-C, AGACNP-BC [facility] - Electrophysiology  10/08/2023, 3:36 PM

## 2023-10-08 ENCOUNTER — Ambulatory Visit: Payer: Medicare (Managed Care) | Attending: Physician Assistant | Admitting: Pulmonary Disease

## 2023-10-08 ENCOUNTER — Encounter: Payer: Self-pay | Admitting: Pulmonary Disease

## 2023-10-08 VITALS — BP 138/62 | Ht 70.0 in | Wt 180.0 lb

## 2023-10-08 DIAGNOSIS — R002 Palpitations: Secondary | ICD-10-CM

## 2023-10-08 DIAGNOSIS — I4819 Other persistent atrial fibrillation: Secondary | ICD-10-CM | POA: Diagnosis not present

## 2023-10-08 DIAGNOSIS — D6869 Other thrombophilia: Secondary | ICD-10-CM | POA: Diagnosis not present

## 2023-10-08 NOTE — Patient Instructions (Signed)
Medication Instructions:   Your physician recommends that you continue on your current medications as directed. Please refer to the Current Medication list given to you today.   *If you need a refill on your cardiac medications before your next appointment, please call your pharmacy*   Lab Work:  None ordered.  If you have labs (blood work) drawn today and your tests are completely normal, you will receive your results only by: [place] (if you have MyChart) OR A paper copy in the mail If you have any lab test that is abnormal or we need to change your treatment, we will call you to review the results.   Testing/Procedures:  None ordered.   Follow-Up: At [facility], you and your health needs are our priority.  As part of our continuing mission to provide you with exceptional heart care, we have created designated Provider Care Teams.  These Care Teams include your primary Cardiologist (physician) and Advanced Practice Providers (APPs -  Physician Assistants and Nurse Practitioners) who all work together to provide you with the care you need, when you need it.  We recommend signing up for the patient portal called "MyChart".  Sign up information is provided on this After Visit Summary.  MyChart is used to connect with patients for Virtual Visits (Telemedicine).  Patients are able to view lab/test results, encounter notes, upcoming appointments, etc.  Non-urgent messages can be sent to your provider as well.   To learn more about what you can do with MyChart, go to [email or web address].    Your next appointment:   2 month(s)  Provider:   Allegra Lai, MD    Other Instructions  HOW TO TAKE YOUR BLOOD PRESSURE  Rest 5 minutes before taking your blood pressure. Don't  smoke or drink caffeinated beverages for at least 30 minutes before. Take your blood pressure before (not after) you eat. Sit comfortably with your back supported and both feet on the floor (  don't cross your legs). Elevate your arm to heart level on a table or a desk. Use the proper sized cuff.  It should fit smoothly and snugly around your bare upper arm.  There should be  Enough room to slip a fingertip under the cuff.  The bottom edge of the cuff should be 1 inch above the crease Of the elbow. Please monitor your blood pressure once daily 2 hours after your am medication. If you blood pressure Consistently remains above 836 (systolic) top number or over 80 ( diastolic) bottom number X 3 days  Consecutively.  Please call our office at [phone] or send Mychart message.     ----Avoid cold medicines with D or DM at the end of them----

## 2023-11-02 ENCOUNTER — Ambulatory Visit (HOSPITAL_COMMUNITY): Payer: Medicare (Managed Care) | Attending: Internal Medicine

## 2023-11-02 DIAGNOSIS — G4730 Sleep apnea, unspecified: Secondary | ICD-10-CM | POA: Diagnosis not present

## 2023-11-02 DIAGNOSIS — I4892 Unspecified atrial flutter: Secondary | ICD-10-CM | POA: Diagnosis not present

## 2023-11-02 DIAGNOSIS — I4891 Unspecified atrial fibrillation: Secondary | ICD-10-CM | POA: Insufficient documentation

## 2023-11-02 DIAGNOSIS — E785 Hyperlipidemia, unspecified: Secondary | ICD-10-CM | POA: Insufficient documentation

## 2023-11-02 DIAGNOSIS — E039 Hypothyroidism, unspecified: Secondary | ICD-10-CM | POA: Diagnosis not present

## 2023-11-02 DIAGNOSIS — R5383 Other fatigue: Secondary | ICD-10-CM | POA: Insufficient documentation

## 2023-11-02 DIAGNOSIS — R002 Palpitations: Secondary | ICD-10-CM | POA: Insufficient documentation

## 2023-11-02 DIAGNOSIS — I081 Rheumatic disorders of both mitral and tricuspid valves: Secondary | ICD-10-CM | POA: Diagnosis not present

## 2023-11-02 LAB — ECHOCARDIOGRAM COMPLETE: S' Lateral: 3.1 cm

## 2023-11-05 ENCOUNTER — Other Ambulatory Visit: Payer: Self-pay

## 2023-11-05 DIAGNOSIS — I480 Paroxysmal atrial fibrillation: Secondary | ICD-10-CM

## 2023-11-05 MED ORDER — RIVAROXABAN 20 MG PO TABS: 20.0000 mg | ORAL_TABLET | Freq: Every day | ORAL | 1 refills | Status: DC

## 2023-11-05 NOTE — Telephone Encounter (Signed)
Prescription refill request for Xarelto received.  Indication:afib Last office visit:10/24 Weight:81.6  kg Age:77 Scr:1.01  8/24 CrCl:60.09  ml/min  Prescription refilled

## 2023-11-19 ENCOUNTER — Other Ambulatory Visit: Payer: Self-pay | Admitting: *Deleted

## 2023-11-19 MED ORDER — LOSARTAN POTASSIUM 25 MG PO TABS: 25.0000 mg | ORAL_TABLET | Freq: Two times a day (BID) | ORAL | 3 refills | Status: DC

## 2023-11-19 NOTE — Telephone Encounter (Signed)
Spoke with patient and discussed note below from [place] with her to which she states her understanding. Medication send to pharmacy of choice and scheduled nurse visit with her as well.

## 2023-11-20 ENCOUNTER — Ambulatory Visit: Payer: PRIVATE HEALTH INSURANCE | Admitting: Internal Medicine

## 2023-11-26 ENCOUNTER — Ambulatory Visit: Payer: Medicare (Managed Care) | Attending: Cardiology | Admitting: Cardiology

## 2023-11-26 ENCOUNTER — Encounter: Payer: Self-pay | Admitting: Cardiology

## 2023-11-26 VITALS — BP 136/66 | HR 79 | Ht 70.0 in | Wt 177.0 lb

## 2023-11-26 DIAGNOSIS — D6869 Other thrombophilia: Secondary | ICD-10-CM | POA: Diagnosis not present

## 2023-11-26 DIAGNOSIS — I480 Paroxysmal atrial fibrillation: Secondary | ICD-10-CM | POA: Diagnosis not present

## 2023-11-26 DIAGNOSIS — Z79899 Other long term (current) drug therapy: Secondary | ICD-10-CM

## 2023-11-26 MED ORDER — FUROSEMIDE 20 MG PO TABS: 20.0000 mg | ORAL_TABLET | Freq: Every day | ORAL | 3 refills | Status: AC | PRN

## 2023-11-26 NOTE — Patient Instructions (Signed)
Medication Instructions:  °Your physician recommends that you continue on your current medications as directed. Please refer to the Current Medication list given to you today. ° °*If you need a refill on your cardiac medications before your next appointment, please call your pharmacy* ° ° °Lab Work: °None ordered ° ° °Testing/Procedures: °None ordered ° ° °Follow-Up: °At [facility], you and your health needs are our priority.  As part of our continuing mission to provide you with exceptional heart care, we have created designated Provider Care Teams.  These Care Teams include your primary Cardiologist (physician) and Advanced Practice Providers (APPs -  Physician Assistants and Nurse Practitioners) who all work together to provide you with the care you need, when you need it. ° °Your next appointment:   °6 month(s) ° °The format for your next appointment:   °In Person ° °Provider:   °Will Camnitz, MD ° ° ° °Thank you for choosing [facility]!! ° ° °Jyaire Koudelka, RN °[phone] °  °

## 2023-11-26 NOTE — Progress Notes (Signed)
  Electrophysiology Office Note:   Date:  11/26/2023  ID:  Rebecca Carter, DOB 12-20-1945, MRN [id number]  Primary Cardiologist: None Electrophysiologist: Dim Meisinger Jorja Loa, MD      History of Present Illness:   Rebecca Carter is a 77 y.o. female with h/o atrial fibrillation, hyperlipidemia, hypothyroidism seen today for routine electrophysiology followup.   Since last being seen in our clinic the patient reports doing overall well.  She has no chest pain or shortness of breath.  She was initially feeling quite poor on amiodarone, but since reducing the dose to every other day, she is felt well.  She has no acute complaints.  She may be pulsed field ablation in the future..  she denies chest pain, palpitations, dyspnea, PND, orthopnea, nausea, vomiting, dizziness, syncope, edema, weight gain, or early satiety.   Review of systems complete and found to be negative unless listed in HPI.   EP Information / Studies Reviewed:    EKG is ordered today. Personal review as below.  EKG Interpretation Date/Time:  Monday November 26 2023 15:55:12 EST Ventricular Rate:  79 PR Interval:  160 QRS Duration:  86 QT Interval:  380 QTC Calculation: 435 R Axis:   -53  Text Interpretation: Normal sinus rhythm Pulmonary disease pattern Left anterior fascicular block Septal infarct (cited on or before 26-Nov-2023) When compared with ECG of 08-Oct-2023 13:43, Premature ventricular complexes are no longer Present Confirmed by Kataleya Zaugg ([id number]) on 11/26/2023 3:59:27 PM     Risk Assessment/Calculations:    CHA2DS2-VASc Score = 4   This indicates a 4.8% annual risk of stroke. The patient's score is based upon: CHF History: 0 HTN History: 0 Diabetes History: 0 Stroke History: 0 Vascular Disease History: 1 (aortic atherosclerosis) Age Score: 2 Gender Score: 1             Physical Exam:   VS:  BP 136/66   Pulse 79   Ht 5\' 10"  (1.778 m)   Wt 177 lb 0.6 oz (80.3 kg)   SpO2 96%   BMI 25.40 kg/m     Wt Readings from Last 3 Encounters:  11/26/23 177 lb 0.6 oz (80.3 kg)  10/08/23 180 lb (81.6 kg)  08/05/23 183 lb (83 kg)     GEN: Well nourished, well developed in no acute distress NECK: No JVD; No carotid bruits CARDIAC: Regular rate and rhythm, no murmurs, rubs, gallops RESPIRATORY:  Clear to auscultation without rales, wheezing or rhonchi  ABDOMEN: Soft, non-tender, non-distended EXTREMITIES:  No edema; No deformity   ASSESSMENT AND PLAN:    1.  Persistent atrial fibrillation: Post ablation x 3, most recently 01/30/2023.  She is currently on amiodarone.  She has not had any further episodes of atrial fibrillation.  She may wish to stop amiodarone in the future.  I have told her pulse.  Ablation would be a reasonable option.  Jiovanny Burdell continue with current management for now.  2.  Secondary hypercoagulable state: Currently on Xarelto for atrial fibrillation  3.  High risk medication monitoring: Currently on amiodarone.  Recent TSH and LFTs within normal limits.  Follow up with Dr. Elberta Fortis in 6 months  Signed, Josephina Melcher Jorja Loa, MD

## 2023-12-20 ENCOUNTER — Ambulatory Visit: Payer: Medicare (Managed Care)

## 2023-12-27 ENCOUNTER — Ambulatory Visit: Payer: PRIVATE HEALTH INSURANCE | Admitting: Internal Medicine

## 2024-03-26 ENCOUNTER — Telehealth (HOSPITAL_COMMUNITY): Payer: Self-pay | Admitting: *Deleted

## 2024-03-26 MED ORDER — DILTIAZEM HCL ER COATED BEADS 120 MG PO CP24: 120.0000 mg | ORAL_CAPSULE | Freq: Every day | ORAL | 3 refills | Status: DC

## 2024-03-26 NOTE — Telephone Encounter (Signed)
 Pt continues having symptoms of "heart beating hard" intermittently throughout the day and into the night. Per Jorja Loa PA will add cardizem 120mg  once a day to see if this will decrease sensation/palpitations. Pt in agreement. If continues with symptoms pt willing to wear 7 day monitor to further assess.

## 2024-04-08 ENCOUNTER — Encounter: Payer: Self-pay | Admitting: Cardiology

## 2024-04-14 NOTE — Telephone Encounter (Signed)
 Patient called in to follow up on this. Patient has been scheduled with Ricky on 5/2 at 11:30am.

## 2024-04-15 NOTE — Telephone Encounter (Signed)
 Apologized to patient for the delay in returning her message.  Aware Rebecca Carter (procedure scheduler) or myself will call her AFTER 5/2 appt w/ AFib clinic to schedule a repeat afib ablation date. She will discuss medication/afib with Ricky at her appt on 5/2. She appreciates my follow up call

## 2024-04-18 ENCOUNTER — Telehealth: Payer: Self-pay

## 2024-04-18 ENCOUNTER — Encounter (HOSPITAL_COMMUNITY): Payer: Self-pay | Admitting: Physician Assistant

## 2024-04-18 ENCOUNTER — Ambulatory Visit (HOSPITAL_COMMUNITY)
Admission: RE | Admit: 2024-04-18 | Discharge: 2024-04-18 | Disposition: A | Discharge status: Home / Self Care | Source: Ambulatory Visit | Attending: Physician Assistant | Admitting: Physician Assistant

## 2024-04-18 VITALS — BP 148/62 | HR 76 | Ht 70.0 in | Wt 177.6 lb

## 2024-04-18 DIAGNOSIS — I4819 Other persistent atrial fibrillation: Secondary | ICD-10-CM

## 2024-04-18 DIAGNOSIS — I10 Essential (primary) hypertension: Secondary | ICD-10-CM | POA: Diagnosis not present

## 2024-04-18 DIAGNOSIS — Z5181 Encounter for therapeutic drug level monitoring: Secondary | ICD-10-CM

## 2024-04-18 DIAGNOSIS — D6869 Other thrombophilia: Secondary | ICD-10-CM

## 2024-04-18 DIAGNOSIS — I480 Paroxysmal atrial fibrillation: Secondary | ICD-10-CM

## 2024-04-18 DIAGNOSIS — Z79899 Other long term (current) drug therapy: Secondary | ICD-10-CM

## 2024-04-18 MED ORDER — LOSARTAN POTASSIUM 50 MG PO TABS: 50.0000 mg | ORAL_TABLET | Freq: Two times a day (BID) | ORAL | 3 refills | Status: DC

## 2024-04-18 NOTE — Progress Notes (Signed)
 Primary Care Physician: Diona Franklin, FNP Primary Cardiologist: Dr Jearldine Mina Primary Electrophysiologist: Dr Lawana Pray Referring Physician: Dr Lawana Pray   Rebecca Carter is a 78 y.o. female with a history of persistent atrial fibrillation, atrial flutter, HLD, HTN, aortic atherosclerosis, hypothyroidism who presents for follow up in the [facility]. Patient is on Xarelto  for a CHADS2VASC score of 4. She had an afib ablation in 2016 and an afib/flutter ablation by Dr Jearldine Mina on 12/29/19. Patient continued to have paroxysms of afib and presented to Dr Lawana Pray for a second opinion on 04/01/20. Patient is s/p dofetilide  admission 5/3-04/22/20. She did not require DCCV. She had an increased burden of afib and underwent another ablation with Dr Lawana Pray on 01/30/23.  Patient was seen at ED 04/06/23 for afib and underwent DCCV. She was started on amiodarone .   Patient returns for follow up for atrial fibrillation and amiodarone  monitoring. She reports that she has been staying in SR but she has feelings of "forceful heartbeats". They are fairly constant but more noticeable when she lays down. Interestingly, her symptoms improved when her amiodarone  was decreased but did not improve with diltiazem . No bleeding issues on anticoagulation.   Today, she  denies symptoms of chest pain, shortness of breath, orthopnea, PND, lower extremity edema, dizziness, presyncope, syncope, bleeding, or neurologic sequela. The patient is tolerating medications without difficulties and is otherwise without complaint today.    Atrial Fibrillation Risk Factors:  she does have symptoms or diagnosis of sleep apnea. she does not have a history of rheumatic fever. she does not have a history of alcohol use.   Atrial Fibrillation Management history:  Previous antiarrhythmic drugs: Multaq, flecainide , sotalol, dofetilide , amiodarone   Previous cardioversions: 04/06/23 Previous ablations: 2016, 12/29/19,  01/30/23 Anticoagulation history: Xarelto     Past Medical History:  Diagnosis Date   Allergic rhinitis    Atrial fibrillation (HCC)    GERD (gastroesophageal reflux disease)    Hyperlipidemia    Hypothyroid     Current Outpatient Medications  Medication Sig Dispense Refill   amiodarone  (PACERONE ) 200 MG tablet Take 1 tablet (200 mg total) by mouth every other day. T 45 tablet 3   atorvastatin  (LIPITOR) 40 MG tablet TAKE ONE TABLET BY MOUTH ONE TIME DAILY 90 tablet 3   azelastine  (ASTELIN ) 0.1 % nasal spray Place 2 sprays into both nostrils 2 (two) times daily. (Patient taking differently: Place 2 sprays into both nostrils daily as needed for allergies.) 30 mL 5   Cholecalciferol  (VITAMIN D3) 2000 units capsule Take 2,000 Units by mouth daily.      diltiazem  (CARDIZEM ) 30 MG tablet Take 1 tablet (30 mg total) by mouth every 4 (four) hours as needed (AFIB/PALPITATIONS). 90 tablet 0   fexofenadine  (ALLEGRA ) 180 MG tablet Take 180 mg by mouth daily as needed for allergies.      furosemide  (LASIX ) 20 MG tablet Take 1 tablet (20 mg total) by mouth daily as needed (swelling). 30 tablet 3   Levothyroxine  Sodium 50 MCG CAPS Take 50 mcg by mouth daily before breakfast.     pantoprazole  (PROTONIX ) 40 MG tablet Take 1 tablet by mouth every morning.     potassium chloride  (KLOR-CON ) 10 MEQ tablet TAKE TWO TABLETS BY MOUTH ONE TIME DAILY 60 tablet 6   rivaroxaban  (XARELTO ) 20 MG TABS tablet Take 1 tablet (20 mg total) by mouth daily with supper. 90 tablet 1   diltiazem  (CARDIZEM  CD) 120 MG 24 hr capsule Take 1 capsule (120 mg total)  by mouth daily. (Patient not taking: Reported on 04/18/2024) 30 capsule 3   losartan  (COZAAR ) 50 MG tablet Take 1 tablet (50 mg total) by mouth 2 (two) times daily. 60 tablet 3   No current facility-administered medications for this encounter.    ROS- All systems are reviewed and negative except as per the HPI above.  Physical Exam: Vitals:   04/18/24 1127  BP: (!)  148/62  Pulse: 76  Weight: 80.6 kg  Height: 5\' 10"  (1.778 m)     GEN: Well nourished, well developed in no acute distress CARDIAC: Regular rate and rhythm, no murmurs, rubs, gallops RESPIRATORY:  Clear to auscultation without rales, wheezing or rhonchi  ABDOMEN: Soft, non-tender, non-distended EXTREMITIES:  No edema; No deformity    Wt Readings from Last 3 Encounters:  04/18/24 80.6 kg  11/26/23 80.3 kg  10/08/23 81.6 kg    EKG today demonstrates  SR Vent. rate 76 BPM PR interval 194 ms QRS duration 92 ms QT/QTcB 396/445 ms   Echo 11/02/23 demonstrated   1. Left ventricular ejection fraction, by estimation, is 60 to 65%. The  left ventricle has normal function. The left ventricle has no regional  wall motion abnormalities. Left ventricular diastolic parameters are  consistent with Grade II diastolic dysfunction (pseudonormalization).   2. Right ventricular systolic function is normal. The right ventricular  size is normal. There is mildly elevated pulmonary artery systolic  pressure. The estimated right ventricular systolic pressure is 36.9 mmHg.   3. Left atrial size was severely dilated.   4. The mitral valve is normal in structure. Mild mitral valve  regurgitation. No evidence of mitral stenosis. There is mild prolapse of  of the mitral valve.   5. Tricuspid valve regurgitation is moderate.   6. The aortic valve is bicuspid. Aortic valve regurgitation is not  visualized. No aortic stenosis is present.   7. The inferior vena cava is normal in size with greater than 50%  respiratory variability, suggesting right atrial pressure of 3 mmHg.    Epic records are reviewed at length today  CHA2DS2-VASc Score = 5  The patient's score is based upon: CHF History: 0 HTN History: 1 Diabetes History: 0 Stroke History: 0 Vascular Disease History: 1 (aoritc atherosclerosis) Age Score: 2 Gender Score: 1       ASSESSMENT AND PLAN: Persistent Atrial Fibrillation/atrial  flutter The patient's CHA2DS2-VASc score is 5, indicating a 7.2% annual risk of stroke.   S/p afib ablation x 3, most recently 01/30/23 Dofetilide  previously ineffective Currently in SR on amiodarone . She would like to get off amiodarone  to avoid off target effects. She is agreeable to PFA with Dr Lawana Pray.  Continue amiodarone  100 mg daily for now. Can consider decreasing further if needed.  Continue diltiazem  30 mg PRN q 4 hours for heart racing. Patient discontinued diltiazem  120 mg as it was not helping.  Continue Xarelto  20 mg daily   Secondary Hypercoagulable State (ICD10:  D68.69) The patient is at significant risk for stroke/thromboembolism based upon her CHA2DS2-VASc Score of 5.  Continue Rivaroxaban  (Xarelto ). No bleeding issues.   High Risk Medication Monitoring (ICD 10: Z79.899) Intervals on ECG acceptable for amiodarone  monitoring.   HTN Mildly elevated today, will increased losartan  to 50 mg BID to try and optimize her BP. This may help her forceful heart beat.  Check bmet in two weeks.     Follow up with Dr Lawana Pray as scheduled.    Myrtha Ates PA-C [facility] [address] [place], [place] [address] [phone] 04/18/2024 12:02 PM

## 2024-04-18 NOTE — Telephone Encounter (Signed)
 Called pt to schedule her for repeat Afib Ablation with Dr. Lawana Pray. Offered her 5/21 since we had a cancellation. She wants to think about it over the weekend and will call be back on Monday morning with a definite answer. She is aware that the next available opening is 8/6.  Pt will need updated labs.

## 2024-04-21 ENCOUNTER — Telehealth: Payer: Self-pay | Admitting: *Deleted

## 2024-04-21 NOTE — Telephone Encounter (Signed)
 Spoke to pt around 9:30 am, procedure date of 7/17 held for patient.  She is scheduled to see Dr. Lawana Pray on 6/18 to discuss and obtain pre procedure blood work and procedure letter instructions.  Patient verbalized understanding and agreeable to plan.

## 2024-04-28 ENCOUNTER — Other Ambulatory Visit (HOSPITAL_COMMUNITY): Payer: Self-pay | Admitting: Physician Assistant

## 2024-05-03 LAB — BASIC METABOLIC PANEL WITH GFR
BUN/Creatinine Ratio: 15 (ref 12–28)
BUN: 16 mg/dL (ref 8–27)
CO2: 20 mmol/L (ref 20–29)
Calcium: 9.3 mg/dL (ref 8.7–10.3)
Chloride: 105 mmol/L (ref 96–106)
Creatinine, Ser: 1.06 mg/dL — ABNORMAL HIGH (ref 0.57–1.00)
Glucose: 98 mg/dL (ref 70–99)
Potassium: 4.2 mmol/L (ref 3.5–5.2)
Sodium: 144 mmol/L (ref 134–144)
eGFR: 54 mL/min/{1.73_m2} — ABNORMAL LOW (ref >59)

## 2024-05-05 ENCOUNTER — Ambulatory Visit (HOSPITAL_COMMUNITY): Payer: Self-pay | Admitting: Physician Assistant

## 2024-05-06 ENCOUNTER — Telehealth (HOSPITAL_COMMUNITY): Payer: Self-pay | Admitting: *Deleted

## 2024-05-06 ENCOUNTER — Other Ambulatory Visit: Payer: Self-pay

## 2024-05-06 DIAGNOSIS — I480 Paroxysmal atrial fibrillation: Secondary | ICD-10-CM

## 2024-05-06 MED ORDER — AMIODARONE HCL 200 MG PO TABS: ORAL_TABLET | ORAL | Status: DC

## 2024-05-06 MED ORDER — RIVAROXABAN 20 MG PO TABS: 20.0000 mg | ORAL_TABLET | Freq: Every day | ORAL | 1 refills | Status: DC

## 2024-05-06 NOTE — Telephone Encounter (Signed)
 Received faxed refill request for Xarelto  from [facility].  Pt last saw Clint Fenton, PA on 04/18/24, last labs 05/02/24 Creat 1.06, age 78, weight 80.6kg, CrCl 56.55, based on CrCl pt is on appropriate dosage of Xarelto  20mg  every day for afib.  Will refill rx.

## 2024-05-06 NOTE — Telephone Encounter (Signed)
 Patient called in stating since increase of losartan  her blood pressures have been running in the 130s/60s. Does still feel the "forceful heart beats."  Also inquiring if amiodarone  can be further reduced. Discussed above with Myrtha Ates PA will decrease Amiodarone  to 100mg  on Monday, Wednesday, Friday, Saturday only. Continue at current dose of losartan . Pt in agreement.

## 2024-05-19 ENCOUNTER — Ambulatory Visit: Payer: PRIVATE HEALTH INSURANCE | Admitting: Allergy

## 2024-05-19 ENCOUNTER — Ambulatory Visit (INDEPENDENT_AMBULATORY_CARE_PROVIDER_SITE_OTHER): Payer: PRIVATE HEALTH INSURANCE | Admitting: Internal Medicine

## 2024-05-19 ENCOUNTER — Encounter: Payer: Self-pay | Admitting: Internal Medicine

## 2024-05-19 VITALS — BP 138/70 | HR 66 | Temp 97.9°F | Resp 16 | Ht 69.0 in | Wt 179.0 lb

## 2024-05-19 DIAGNOSIS — I10 Essential (primary) hypertension: Secondary | ICD-10-CM | POA: Diagnosis not present

## 2024-05-19 DIAGNOSIS — J3089 Other allergic rhinitis: Secondary | ICD-10-CM

## 2024-05-19 DIAGNOSIS — I4819 Other persistent atrial fibrillation: Secondary | ICD-10-CM

## 2024-05-19 DIAGNOSIS — G4733 Obstructive sleep apnea (adult) (pediatric): Secondary | ICD-10-CM | POA: Diagnosis not present

## 2024-05-19 MED ORDER — NEFFY 2 MG/0.1ML NA SOLN: 1.0000 | Freq: Once | NASAL | 1 refills | Status: AC | PRN

## 2024-05-19 NOTE — Patient Instructions (Signed)
 Allergic rhinitis Chronic allergic rhinitis with worsening symptoms post-discontinuation of allergy injections. Controlled with Allegra  and Astelin , but breakthrough symptoms persist. Discussed resuming allergy injections to reduce antihistamine use and improve control. Considered Rush immunotherapy for expedited treatment. No contraindications for resuming injections. Allergy injections have 80% success rate if completed for 3-5 years. - Order blood work to recheck allergy status. - Plan to restart allergy injections (will combine any new sensitizations with old testing)  - Prescribe Neffy and allergy action plan - Continue Allegra  180mg  daily  - Continue Astelin  1 spray per nostril twice daily as needed   Start allergy injections. Let us  know if you want to start via RUSH or standard build up  Had a detailed discussion with patient/family that clinical history is suggestive of allergic rhinitis, and may benefit from allergy immunotherapy (AIT). Discussed in detail regarding the dosing, schedule, side effects (mild to moderate local allergic reaction and rarely systemic allergic reactions including anaphylaxis/death), alternatives and benefits (significant improvement in nasal symptoms, seasonal flares of asthma) of immunotherapy with the patient. There is significant time commitment involved with allergy shots, which includes weekly immunotherapy injections for first 9-12 months and then biweekly to monthly injections for 3-5 years. Clinical response is often delayed and patient may not see an improvement for 6-12 months. Consent was signed. I have prescribed epinephrine  injectable and demonstrated proper use. For mild symptoms you can take over the counter antihistamines such as Benadryl and monitor symptoms closely. If symptoms worsen or if you have severe symptoms including breathing issues, throat closure, significant swelling, whole body hives, severe diarrhea and vomiting, lightheadedness then  inject epinephrine  and seek immediate medical care afterwards. Action plan given.   Atrial fibrillation Managed with amiodarone . Preparing for fourth ablation to potentially discontinue amiodarone . No beta blockers used currently   Hypertension Managed with losartan . No concerns related to allergy treatment.  OSA on CPAP  Continue CPAP use   Allergic rhinitis follow-up Follow-up plan discussed for management and potential resumption of allergy injections. - Call her with blood work results to discuss starting allergy injections. - Provide information on standard and Rush immunotherapy options. - Schedule follow-up appointments as needed based on treatment decisions.  Thank you so much for letting me partake in your care today.  Don't hesitate to reach out if you have any additional concerns!  Orelia Binet, MD  [facility] [place], [place]

## 2024-05-19 NOTE — Progress Notes (Unsigned)
 NEW PATIENT Date of Service/Encounter:  05/19/24 Referring provider: Diona Franklin, FNP Primary care provider: Lennon Race, MD  Subjective:  Rebecca Carter is a 78 y.o. female  presenting today for evaluation of rhinitis  History obtained from: chart review and patient.   Discussed the use of AI scribe software for clinical note transcription with the patient, who gave verbal consent to proceed.  History of Present Illness Rebecca Carter is a 78 year old female who presents with allergy symptoms and consideration of restarting allergy injections.  She has a history of allergies and previously underwent allergy injections from 2018 to 2020. She discontinued the injections before the pandemic due to large local reactions and logistical challenges, such as being late for appointments and having to restart vials due to reactions. She never reached maintenance doses. She did feel like it helped her symptoms.    Her symptoms are primarily seasonal, starting around late January or February, coinciding with tree pollen season. She uses Allegra  and Astelin  nasal spray, which help control her symptoms, particularly nasal congestion, but she is experiencing breakthrough symptoms. No eye symptoms such as itching or watering are present.  Her past medical history includes atrial fibrillation, for which she is on amiodarone  and is preparing for her fourth ablation. She also takes losartan  for blood pressure and thyroid medication. She is on Xarelto  and uses a CPAP machine, which sometimes makes it difficult to breathe at night due to nasal symptoms. She has experienced epistaxis, which she attributes to her medications.  She is retired and does not have logistical issues that would prevent her from restarting allergy injections.    Other allergy screening: Asthma: no Rhino conjunctivitis: yes Food allergy: no Medication allergy: yes Hymenoptera allergy: no Urticaria:  no Eczema:no History of recurrent infections suggestive of immunodeficency: no Vaccinations are up to date.   Past Medical History: Past Medical History:  Diagnosis Date   Allergic rhinitis    Atrial fibrillation (HCC)    GERD (gastroesophageal reflux disease)    Hyperlipidemia    Hypothyroid    Medication List:  Current Outpatient Medications  Medication Sig Dispense Refill   amiodarone  (PACERONE ) 200 MG tablet Take 100mg  by mouth on Mon, Wed, Fri, Sat only     atorvastatin  (LIPITOR) 40 MG tablet TAKE ONE TABLET BY MOUTH ONE TIME DAILY 90 tablet 3   azelastine  (ASTELIN ) 0.1 % nasal spray Place 2 sprays into both nostrils 2 (two) times daily. (Patient taking differently: Place 2 sprays into both nostrils daily as needed for allergies.) 30 mL 5   Cholecalciferol  (VITAMIN D3) 2000 units capsule Take 2,000 Units by mouth daily.      EPINEPHrine  (NEFFY) 2 MG/0.1ML SOLN Place 1 spray into the nose once as needed for up to 1 dose (for severe allergic reactions). 6 each 1   Ergocalciferol  50 MCG (2000 UT) CAPS 1 capsule.     fexofenadine  (ALLEGRA ) 180 MG tablet Take 180 mg by mouth daily as needed for allergies.      furosemide  (LASIX ) 20 MG tablet Take 1 tablet (20 mg total) by mouth daily as needed (swelling). 30 tablet 3   Levothyroxine  Sodium 50 MCG CAPS Take 50 mcg by mouth daily before breakfast.     losartan  (COZAAR ) 50 MG tablet Take 1 tablet (50 mg total) by mouth 2 (two) times daily. 60 tablet 3   pantoprazole  (PROTONIX ) 40 MG tablet Take 1 tablet by mouth every morning.     potassium chloride  (KLOR-CON ) 10  MEQ tablet Take 1 tablet (10 mEq total) by mouth 2 (two) times daily. 180 tablet 2   rivaroxaban  (XARELTO ) 20 MG TABS tablet Take 1 tablet (20 mg total) by mouth daily with supper. 90 tablet 1   No current facility-administered medications for this visit.   Known Allergies:  Allergies  Allergen Reactions   Sulfa Antibiotics Rash   Amoxicillin Other (See Comments)     Stomach issues   Augmentin [Amoxicillin-Pot Clavulanate] Other (See Comments)    Stomach issues   Nsaids Other (See Comments)    PATIENT TAKES XARELTO  AND HAS CHRONIC KIDNEY DISEASE   Other Other (See Comments)    PATIENT TAKES XARELTO  AND HAS CHRONIC KIDNEY DISEASE     Trazodone Other (See Comments)    Nightmares    Latex Rash   Past Surgical History: Past Surgical History:  Procedure Laterality Date   ABLATION     cardiac x3   ADENOIDECTOMY     APPENDECTOMY     ATRIAL FIBRILLATION ABLATION N/A 01/30/2023   Procedure: ATRIAL FIBRILLATION ABLATION;  Surgeon: Lei Pump, MD;  Location: [facility];  Service: Cardiovascular;  Laterality: N/A;   HERNIA REPAIR     pulmonary vein abrasion     SHOULDER ARTHROSCOPY     TONSILLECTOMY     Family History: Family History  Problem Relation Age of Onset   Asthma Mother    Asthma Sister    Social History: Mykiah lives TH that is 78 years old, hardwood throughout, gas heat and central cooling, no pets, no roaches in the house and bed is 2 feet off the floor, no tobacco exposure, retired, [employer] filter in the home .   ROS:  All other systems negative except as noted per HPI.  Objective:  Blood pressure 138/70, pulse 66, temperature 97.9 F (36.6 C), temperature source Temporal, resp. rate 16, height 5\' 9"  (1.753 m), weight 179 lb (81.2 kg), SpO2 99%. Body mass index is 26.43 kg/m. Physical Exam:  General Appearance:  Alert, cooperative, no distress, appears stated age  Head:  Normocephalic, without obvious abnormality, atraumatic  Eyes:  Conjunctiva clear, EOM's intact  Ears EACs normal bilaterally  Nose: Nares normal, erythematous , hypertrophic turbinates, no visible anterior polyps, and septum midline  Throat: Lips, tongue normal; teeth and gums normal, + cobblestoning  Neck: Supple, symmetrical  Lungs:   clear to auscultation bilaterally, Respirations unlabored, no coughing  Heart:  regular rate and rhythm and no  murmur, Appears well perfused  Extremities: No edema  Skin: Skin color, texture, turgor normal and no rashes or lesions on visualized portions of skin  Neurologic: No gross deficits   Diagnostics: None    Labs:  Lab Orders         Allergens Zone 2       Assessment and Plan  Assessment and Plan Assessment & Plan Allergic rhinitis Chronic allergic rhinitis with worsening symptoms post-discontinuation of allergy injections. Controlled with Allegra  and Astelin , but breakthrough symptoms persist. Discussed resuming allergy injections to reduce antihistamine use and improve control. Considered Rush immunotherapy for expedited treatment. No contraindications for resuming injections. Allergy injections have 80% success rate if completed for 3-5 years. - Order blood work to recheck allergy status. - Plan to restart allergy injections (will combine any new sensitizations with old testing)  - Prescribe Neffy and allergy action plan - Continue Allegra  180mg  daily  - Continue Astelin  1 spray per nostril twice daily as needed   Start allergy injections. Let  us  know if you want to start via RUSH or standard build up  Had a detailed discussion with patient/family that clinical history is suggestive of allergic rhinitis, and may benefit from allergy immunotherapy (AIT). Discussed in detail regarding the dosing, schedule, side effects (mild to moderate local allergic reaction and rarely systemic allergic reactions including anaphylaxis/death), alternatives and benefits (significant improvement in nasal symptoms, seasonal flares of asthma) of immunotherapy with the patient. There is significant time commitment involved with allergy shots, which includes weekly immunotherapy injections for first 9-12 months and then biweekly to monthly injections for 3-5 years. Clinical response is often delayed and patient may not see an improvement for 6-12 months. Consent was signed. I have prescribed epinephrine   injectable and demonstrated proper use. For mild symptoms you can take over the counter antihistamines such as Benadryl and monitor symptoms closely. If symptoms worsen or if you have severe symptoms including breathing issues, throat closure, significant swelling, whole body hives, severe diarrhea and vomiting, lightheadedness then inject epinephrine  and seek immediate medical care afterwards. Action plan given.   Atrial fibrillation Managed with amiodarone . Preparing for fourth ablation to potentially discontinue amiodarone . No beta blockers used currently   Hypertension Managed with losartan . No concerns related to allergy treatment.  OSA on CPAP  Continue CPAP use   Allergic rhinitis follow-up Follow-up plan discussed for management and potential resumption of allergy injections. - Call her with blood work results to discuss starting allergy injections. - Provide information on standard and Rush immunotherapy options. - Schedule follow-up appointments as needed based on treatment decisions.    This note in its entirety was forwarded to the Provider who requested this consultation.  Other:    Thank you for your kind referral. I appreciate the opportunity to take part in Kalyse's care. Please do not hesitate to contact me with questions.  Sincerely,  Thank you so much for letting me partake in your care today.  Don't hesitate to reach out if you have any additional concerns!  Orelia Binet, MD  [facility] [place], [place]

## 2024-05-21 LAB — IGE+ALLERGENS ZONE 2(30)
Alternaria Alternata IgE: <0.1 kU/L
Amer Sycamore IgE Qn: <0.1 kU/L
Aspergillus Fumigatus IgE: <0.1 kU/L
Bahia Grass IgE: <0.1 kU/L
Bermuda Grass IgE: <0.1 kU/L
Cat Dander IgE: 0.38 kU/L — AB
Cedar, Mountain IgE: 1.19 kU/L — AB
Cladosporium Herbarum IgE: <0.1 kU/L
Cockroach, American IgE: <0.1 kU/L
Common Silver Birch IgE: 0.8 kU/L — AB
D Farinae IgE: 0.19 kU/L — AB
D Pteronyssinus IgE: 0.21 kU/L — AB
Dog Dander IgE: <0.1 kU/L
Elm, American IgE: <0.1 kU/L
Hickory, White IgE: 0.45 kU/L — AB
IgE (Immunoglobulin E), Serum: 49 [IU]/mL (ref 6–495)
Johnson Grass IgE: <0.1 kU/L
Maple/Box Elder IgE: 0.13 kU/L — AB
Mucor Racemosus IgE: <0.1 kU/L
Mugwort IgE Qn: <0.1 kU/L
Nettle IgE: <0.1 kU/L
Oak, White IgE: 0.78 kU/L — AB
Penicillium Chrysogen IgE: <0.1 kU/L
Pigweed, Rough IgE: <0.1 kU/L
Plantain, English IgE: <0.1 kU/L
Ragweed, Short IgE: <0.1 kU/L
Sheep Sorrel IgE Qn: <0.1 kU/L
Stemphylium Herbarum IgE: <0.1 kU/L
Sweet gum IgE RAST Ql: 0.35 kU/L — AB
Timothy Grass IgE: <0.1 kU/L
White Mulberry IgE: <0.1 kU/L

## 2024-05-23 ENCOUNTER — Telehealth: Payer: Self-pay

## 2024-05-23 ENCOUNTER — Ambulatory Visit: Payer: Self-pay | Admitting: Internal Medicine

## 2024-05-23 DIAGNOSIS — I480 Paroxysmal atrial fibrillation: Secondary | ICD-10-CM

## 2024-05-23 NOTE — Telephone Encounter (Signed)
 Called pt to go over instructions for Afib Ablation on 7/17 with Dr. Lawana Pray. She will have labs done at her o/v with Dr. Lawana Pray on 6/18.  No CT needed per WC.

## 2024-05-23 NOTE — Progress Notes (Signed)
 Patient still positive testing to dust mite, cat, tree pollen.  Recommend starting allergy injections.  She just needs to let us  know if you are start rash for standard buildup.

## 2024-06-04 ENCOUNTER — Encounter: Payer: Self-pay | Admitting: Cardiology

## 2024-06-04 ENCOUNTER — Other Ambulatory Visit: Payer: Self-pay

## 2024-06-04 ENCOUNTER — Ambulatory Visit: Payer: Medicare (Managed Care) | Attending: Cardiology | Admitting: Cardiology

## 2024-06-04 VITALS — BP 134/68 | HR 67 | Ht 69.0 in | Wt 168.0 lb

## 2024-06-04 DIAGNOSIS — R002 Palpitations: Secondary | ICD-10-CM

## 2024-06-04 DIAGNOSIS — R001 Bradycardia, unspecified: Secondary | ICD-10-CM | POA: Diagnosis not present

## 2024-06-04 DIAGNOSIS — I480 Paroxysmal atrial fibrillation: Secondary | ICD-10-CM

## 2024-06-04 DIAGNOSIS — Z79899 Other long term (current) drug therapy: Secondary | ICD-10-CM

## 2024-06-04 DIAGNOSIS — I484 Atypical atrial flutter: Secondary | ICD-10-CM | POA: Diagnosis not present

## 2024-06-04 DIAGNOSIS — I4819 Other persistent atrial fibrillation: Secondary | ICD-10-CM

## 2024-06-04 NOTE — Progress Notes (Signed)
  Electrophysiology Office Note:   Date:  06/04/2024  ID:  Rebecca Carter, DOB 04-Nov-1946, MRN [id number]  Primary Cardiologist: None Primary Heart Failure: None Electrophysiologist: Jaziel Bennett Cortland Ding, MD      History of Present Illness:   Rebecca Carter is a 78 y.o. female with h/o atrial fibrillation, hyperlipidemia, hypothyroidism seen today for routine electrophysiology followup.   Since last being seen in our clinic the patient reports doing about the same.  She continues to feel fatigued and mildly short of breath.  She thinks that the amiodarone  is making her feel fatigued.  She continues to have palpitations consistent with her atrial fibrillation.  she denies chest pain, palpitations, dyspnea, PND, orthopnea, nausea, vomiting, dizziness, syncope, edema, weight gain, or early satiety.   Review of systems complete and found to be negative unless listed in HPI.   EP Information / Studies Reviewed:    EKG is ordered today. Personal review as below. Sinus rhythm  Risk Assessment/Calculations:    CHA2DS2-VASc Score = 5   This indicates a 7.2% annual risk of stroke. The patient's score is based upon: CHF History: 0 HTN History: 1 Diabetes History: 0 Stroke History: 0 Vascular Disease History: 1 (aoritc atherosclerosis) Age Score: 2 Gender Score: 1            Physical Exam:   VS:  BP 134/68 (BP Location: Left Arm, Patient Position: Sitting, Cuff Size: Normal)   Pulse 67   Ht 5' 9 (1.753 m)   Wt 168 lb (76.2 kg)   SpO2 98%   BMI 24.81 kg/m    Wt Readings from Last 3 Encounters:  06/04/24 168 lb (76.2 kg)  05/19/24 179 lb (81.2 kg)  04/18/24 177 lb 9.6 oz (80.6 kg)     GEN: Well nourished, well developed in no acute distress NECK: No JVD; No carotid bruits CARDIAC: Regular rate and rhythm, no murmurs, rubs, gallops RESPIRATORY:  Clear to auscultation without rales, wheezing or rhonchi  ABDOMEN: Soft, non-tender, non-distended EXTREMITIES:  No edema; No deformity    ASSESSMENT AND PLAN:    1.  Persistent atrial fibrillation: Post ablation x 3 most recently 01/30/2023.  On amiodarone .  She is continuing to have atrial fibrillation and thinks that the amiodarone  is causing her to have fatigue.  Due to that, we Nabeel Gladson plan for ablation.  Risk, benefits, and alternatives to EP study and radiofrequency/pulse field ablation for afib were also discussed in detail today. These risks include but are not limited to stroke, bleeding, vascular damage, tamponade, perforation, damage to the esophagus, lungs, and other structures, pulmonary vein stenosis, worsening renal function, and death. The patient understands these risk and wishes to proceed.  We Treyvion Durkee therefore proceed with catheter ablation at the next available time.  Carto, ICE, anesthesia are requested for the procedure.  Alenna Russell also obtain CT PV protocol prior to the procedure to exclude LAA thrombus and further evaluate atrial anatomy.  2.  Secondary hypercoagulable state: On Xarelto   3.  High-risk medication monitoring: On amiodarone  at low-dose.  TSH and LFTs within normal limits  Follow up with Afib Clinic as usual post procedure  Signed, Cyntha Brickman Cortland Ding, MD

## 2024-06-05 LAB — CBC
Hematocrit: 41.3 % (ref 34.0–46.6)
Hemoglobin: 13.5 g/dL (ref 11.1–15.9)
MCH: 32.1 pg (ref 26.6–33.0)
MCHC: 32.7 g/dL (ref 31.5–35.7)
MCV: 98 fL — ABNORMAL HIGH (ref 79–97)
Platelets: 236 10*3/uL (ref 150–450)
RBC: 4.2 x10E6/uL (ref 3.77–5.28)
RDW: 13.1 % (ref 11.7–15.4)
WBC: 5 10*3/uL (ref 3.4–10.8)

## 2024-06-05 LAB — BASIC METABOLIC PANEL WITH GFR
BUN/Creatinine Ratio: 21 (ref 12–28)
BUN: 21 mg/dL (ref 8–27)
CO2: 21 mmol/L (ref 20–29)
Calcium: 9.8 mg/dL (ref 8.7–10.3)
Chloride: 102 mmol/L (ref 96–106)
Creatinine, Ser: 1.02 mg/dL — ABNORMAL HIGH (ref 0.57–1.00)
Glucose: 74 mg/dL (ref 70–99)
Potassium: 4.1 mmol/L (ref 3.5–5.2)
Sodium: 142 mmol/L (ref 134–144)
eGFR: 56 mL/min/{1.73_m2} — ABNORMAL LOW (ref >59)

## 2024-06-09 ENCOUNTER — Other Ambulatory Visit: Payer: Self-pay

## 2024-06-09 ENCOUNTER — Encounter: Payer: Self-pay | Admitting: Internal Medicine

## 2024-06-09 MED ORDER — AZELASTINE HCL 0.1 % NA SOLN: 2.0000 | Freq: Every day | NASAL | 5 refills | Status: AC | PRN

## 2024-06-09 NOTE — Telephone Encounter (Signed)
 Refill sent to [facility] on file.

## 2024-06-10 ENCOUNTER — Other Ambulatory Visit: Payer: Self-pay | Admitting: Internal Medicine

## 2024-06-10 DIAGNOSIS — J3089 Other allergic rhinitis: Secondary | ICD-10-CM

## 2024-06-16 NOTE — Progress Notes (Signed)
 Aeroallergen Immunotherapy   Ordering Provider: Dr. Hargis Springer   Patient Details  Name: Rebecca Carter  MRN: [id number]  Date of Birth: 03-Jul-1946   Order 1 of 2   Vial Label: DM-C   0.5 ml (Volume)  1:10 Concentration -- Cat Hair  0.5 ml (Volume)   AU Concentration -- Mite Mix (DF 5,000 & DP 5,000)    1.0  ml Extract Subtotal  4.0  ml Diluent   5.0  ml Maintenance Total   Schedule:   Silver Vial (1:1,000,000): RUSH (6 doses)  Blue Vial (1:100,000): RUSH (6 doses)  Yellow Vial (1:10,000): RUSH (6 doses)  Green Vial (1:1,000): Schedule B (6 doses)  Red Vial (1:100): Schedule A (14 doses)   Special Instructions: After completion of the first Red Vial, please space to every two weeks. After completion of the second Red Vial, please space to every 4 weeks. Ok to up dose new vials at 0.84mL --> 0.3 mL --> 0.5 mL. Ok to come twice weekly, if desired, as long as there is 48 hours between injections.

## 2024-06-16 NOTE — Progress Notes (Signed)
MAKE VIALS CLOSER TO APPT.

## 2024-06-16 NOTE — Progress Notes (Signed)
 Aeroallergen Immunotherapy  Ordering Provider: Dr. Hargis Springer  Patient Details Name: Rebecca Carter MRN: [id number] Date of Birth: 1946/03/15  Order 2 of 2  Vial Label: G-W-T  0.3 ml (Volume)  BAU Concentration -- 7 Grass Mix* 100,000 ([place]  Blue, [place], [place], Perennial [place], RedTop, Sweet Vernal, Timothy) 0.2 ml (Volume)  1:20 Concentration -- [place] 0.3 ml (Volume)  BAU Concentration -- [place] 10,000 0.2 ml (Volume)  1:20 Concentration -- [place] 0.3 ml (Volume)  1:20 Concentration -- Ragweed Mix 0.5 ml (Volume)  1:20 Concentration -- Eastern 10 Tree Mix (also Sweet Gum) 0.2 ml (Volume)  1:20 Concentration -- Box Elder 0.2 ml (Volume)  1:10 Concentration -- Cedar, red   2.2  ml Extract Subtotal 2.8  ml Diluent  5.0  ml Maintenance Total  Schedule:   Silver Vial (1:1,000,000): RUSH (6 doses) Blue Vial (1:100,000): RUSH (6 doses) Yellow Vial (1:10,000): RUSH (6 doses) Green Vial (1:1,000): Schedule B (6 doses) Red Vial (1:100): Schedule A (14 doses)  Special Instructions: After completion of the first Red Vial, please space to every two weeks. After completion of the second Red Vial, please space to every 4 weeks. Ok to up dose new vials at 0.13mL --> 0.3 mL --> 0.5 mL. Ok to come twice weekly, if desired, as long as there is 48 hours between injections.

## 2024-06-26 ENCOUNTER — Telehealth (HOSPITAL_COMMUNITY): Payer: Self-pay

## 2024-06-26 NOTE — Telephone Encounter (Signed)
 Spoke with patient to discuss upcoming procedure.   Labs: completed.   Any recent signs of acute illness or been started on antibiotics? No Any new medications started? No Any medications to hold? No Any missed doses of blood thinner? No Advised patient to continue taking ANTICOAGULANT: Xarelto  (Rivaroxaban ) daily without missing any doses.  Medication instructions:  On the morning of your procedure DO NOT take any medication., including Xarelto  or the procedure may be rescheduled. Nothing to eat or drink after midnight prior to your procedure.  Confirmed patient is scheduled for Atrial Fibrillation Ablation on Thursday, July 17 with Dr. Soyla Norton. Instructed patient to arrive at the Main Entrance A at [facility]: [address] [place], [place] [address] and check in at Admitting at 10:00 AM.  Advised of plan to go home the same day and will only stay overnight if medically necessary. You MUST have a responsible adult to drive you home and MUST be with you the first 24 hours after you arrive home or your procedure could be cancelled.  Patient verbalized understanding to all instructions provided and agreed to proceed with procedure.

## 2024-06-30 ENCOUNTER — Encounter: Payer: Self-pay | Admitting: Cardiology

## 2024-07-02 NOTE — Pre-Procedure Instructions (Signed)
 Instructed patient on the following items: Arrival time 1000 Nothing to eat or drink after midnight No meds AM of procedure Responsible person to drive you home and stay with you for 24 hrs  Have you missed any doses of anti-coagulant Xarelto- takes once a day, hasn't missed any doses.

## 2024-07-03 ENCOUNTER — Ambulatory Visit (HOSPITAL_COMMUNITY): Admitting: Anesthesiology

## 2024-07-03 ENCOUNTER — Ambulatory Visit (HOSPITAL_COMMUNITY)
Admission: RE | Admit: 2024-07-03 | Discharge: 2024-07-03 | Disposition: A | Discharge status: Home / Self Care | Attending: Cardiology | Admitting: Cardiology

## 2024-07-03 ENCOUNTER — Encounter (HOSPITAL_COMMUNITY): Payer: Self-pay | Admitting: Cardiology

## 2024-07-03 ENCOUNTER — Other Ambulatory Visit: Payer: Self-pay

## 2024-07-03 ENCOUNTER — Ambulatory Visit (HOSPITAL_COMMUNITY)
Admission: RE | Disposition: A | Discharge status: Home / Self Care | Payer: Self-pay | Source: Home / Self Care | Attending: Cardiology

## 2024-07-03 DIAGNOSIS — G4733 Obstructive sleep apnea (adult) (pediatric): Secondary | ICD-10-CM

## 2024-07-03 DIAGNOSIS — F418 Other specified anxiety disorders: Secondary | ICD-10-CM | POA: Diagnosis not present

## 2024-07-03 DIAGNOSIS — I483 Typical atrial flutter: Secondary | ICD-10-CM

## 2024-07-03 DIAGNOSIS — Z7901 Long term (current) use of anticoagulants: Secondary | ICD-10-CM | POA: Diagnosis not present

## 2024-07-03 DIAGNOSIS — G4730 Sleep apnea, unspecified: Secondary | ICD-10-CM | POA: Insufficient documentation

## 2024-07-03 DIAGNOSIS — I4891 Unspecified atrial fibrillation: Secondary | ICD-10-CM

## 2024-07-03 DIAGNOSIS — Z79899 Other long term (current) drug therapy: Secondary | ICD-10-CM | POA: Diagnosis not present

## 2024-07-03 DIAGNOSIS — E039 Hypothyroidism, unspecified: Secondary | ICD-10-CM | POA: Diagnosis not present

## 2024-07-03 DIAGNOSIS — E785 Hyperlipidemia, unspecified: Secondary | ICD-10-CM | POA: Insufficient documentation

## 2024-07-03 DIAGNOSIS — I10 Essential (primary) hypertension: Secondary | ICD-10-CM | POA: Diagnosis not present

## 2024-07-03 DIAGNOSIS — F419 Anxiety disorder, unspecified: Secondary | ICD-10-CM | POA: Diagnosis not present

## 2024-07-03 DIAGNOSIS — K219 Gastro-esophageal reflux disease without esophagitis: Secondary | ICD-10-CM | POA: Diagnosis not present

## 2024-07-03 DIAGNOSIS — F32A Depression, unspecified: Secondary | ICD-10-CM | POA: Diagnosis not present

## 2024-07-03 DIAGNOSIS — I4819 Other persistent atrial fibrillation: Secondary | ICD-10-CM | POA: Diagnosis present

## 2024-07-03 HISTORY — PX: ATRIAL FIBRILLATION ABLATION: EP1191

## 2024-07-03 SURGERY — ATRIAL FIBRILLATION ABLATION
Anesthesia: General

## 2024-07-03 MED ORDER — ONDANSETRON HCL 4 MG/2ML IJ SOLN
INTRAMUSCULAR | Status: DC | PRN
Start: 2024-07-03 — End: 2024-07-03
  Administered 2024-07-03: 4 mg via INTRAVENOUS

## 2024-07-03 MED ORDER — LIDOCAINE 2% (20 MG/ML) 5 ML SYRINGE
INTRAMUSCULAR | Status: DC | PRN
  Administered 2024-07-03: 80 mg via INTRAVENOUS

## 2024-07-03 MED ORDER — FENTANYL CITRATE (PF) 250 MCG/5ML IJ SOLN
INTRAMUSCULAR | Status: DC | PRN
  Administered 2024-07-03: 75 ug via INTRAVENOUS
  Administered 2024-07-03: 25 ug via INTRAVENOUS

## 2024-07-03 MED ORDER — ACETAMINOPHEN 325 MG PO TABS
650.0000 mg | ORAL_TABLET | ORAL | Status: DC | PRN
Start: 2024-07-03 — End: 2024-07-03

## 2024-07-03 MED ORDER — PROPOFOL 500 MG/50ML IV EMUL
INTRAVENOUS | Status: DC | PRN
  Administered 2024-07-03: 70 ug/kg/min via INTRAVENOUS

## 2024-07-03 MED ORDER — DEXAMETHASONE SODIUM PHOSPHATE 10 MG/ML IJ SOLN
INTRAMUSCULAR | Status: DC | PRN
  Administered 2024-07-03: 10 mg via INTRAVENOUS

## 2024-07-03 MED ORDER — ATROPINE SULFATE 1 MG/10ML IJ SOSY
PREFILLED_SYRINGE | INTRAMUSCULAR | Status: DC | PRN
  Administered 2024-07-03: 1 mg via INTRAVENOUS

## 2024-07-03 MED ORDER — PROTAMINE SULFATE 10 MG/ML IV SOLN
INTRAVENOUS | Status: DC | PRN
  Administered 2024-07-03: 40 mg via INTRAVENOUS

## 2024-07-03 MED ORDER — HEPARIN (PORCINE) IN NACL 1000-0.9 UT/500ML-% IV SOLN
INTRAVENOUS | Status: DC | PRN
  Administered 2024-07-03 (×3): 500 mL

## 2024-07-03 MED ORDER — SODIUM CHLORIDE 0.9 % IV SOLN: INTRAVENOUS | Status: DC

## 2024-07-03 MED ORDER — ACETAMINOPHEN 500 MG PO TABS
1000.0000 mg | ORAL_TABLET | Freq: Once | ORAL | Status: AC
  Administered 2024-07-03: 1000 mg via ORAL
  Filled 2024-07-03: qty 2

## 2024-07-03 MED ORDER — SUGAMMADEX SODIUM 200 MG/2ML IV SOLN
INTRAVENOUS | Status: DC | PRN
  Administered 2024-07-03: 200 mg via INTRAVENOUS

## 2024-07-03 MED ORDER — HEPARIN SODIUM (PORCINE) 1000 UNIT/ML IJ SOLN
INTRAMUSCULAR | Status: DC | PRN
  Administered 2024-07-03: 14000 [IU] via INTRAVENOUS

## 2024-07-03 MED ORDER — FENTANYL CITRATE (PF) 100 MCG/2ML IJ SOLN
INTRAMUSCULAR | Status: AC
  Filled 2024-07-03: qty 2

## 2024-07-03 MED ORDER — PHENYLEPHRINE HCL-NACL 20-0.9 MG/250ML-% IV SOLN
INTRAVENOUS | Status: DC | PRN
  Administered 2024-07-03: 40 ug/min via INTRAVENOUS

## 2024-07-03 MED ORDER — PROPOFOL 10 MG/ML IV BOLUS
INTRAVENOUS | Status: DC | PRN
  Administered 2024-07-03: 120 mg via INTRAVENOUS

## 2024-07-03 MED ORDER — PHENYLEPHRINE 80 MCG/ML (10ML) SYRINGE FOR IV PUSH (FOR BLOOD PRESSURE SUPPORT)
PREFILLED_SYRINGE | INTRAVENOUS | Status: DC | PRN
  Administered 2024-07-03: 120 ug via INTRAVENOUS

## 2024-07-03 MED ORDER — ROCURONIUM BROMIDE 10 MG/ML (PF) SYRINGE
PREFILLED_SYRINGE | INTRAVENOUS | Status: DC | PRN
  Administered 2024-07-03: 20 mg via INTRAVENOUS
  Administered 2024-07-03: 60 mg via INTRAVENOUS

## 2024-07-03 SURGICAL SUPPLY — 18 items
CABLE FARASTAR GEN2 SNGL USE (CABLE) IMPLANT
CATH FARAWAVE 2.0 31 (CATHETERS) IMPLANT
CATH GE 8FR SOUNDSTAR (CATHETERS) IMPLANT
CATH OCTARAY 2.0 F 3-3-3-3-3 (CATHETERS) IMPLANT
CATH WEB BI DIR CSDF CRV REPRO (CATHETERS) IMPLANT
CLOSURE MYNX CONTROL 6F/7F (Vascular Products) IMPLANT
CLOSURE PERCLOSE PROSTYLE (VASCULAR PRODUCTS) IMPLANT
COVER SWIFTLINK CONNECTOR (BAG) ×1 IMPLANT
DILATOR VESSEL 38 20CM 16FR (INTRODUCER) IMPLANT
GUIDEWIRE INQWIRE 1.5J.035X260 (WIRE) IMPLANT
KIT VERSACROSS CNCT FARADRIVE (KITS) IMPLANT
PACK EP LF (CUSTOM PROCEDURE TRAY) ×1 IMPLANT
PAD DEFIB RADIO PHYSIO CONN (PAD) ×1 IMPLANT
PATCH CARTO3 (PAD) IMPLANT
SHEATH FARADRIVE STEERABLE (SHEATH) IMPLANT
SHEATH PINNACLE 8F 10CM (SHEATH) IMPLANT
SHEATH PINNACLE 9F 10CM (SHEATH) IMPLANT
SHEATH PROBE COVER 6X72 (BAG) IMPLANT

## 2024-07-03 NOTE — Anesthesia Procedure Notes (Signed)
 Procedure Name: Intubation Date/Time: 07/03/2024 11:29 AM  Performed by: Moishe Reyes CROME, CRNAPre-anesthesia Checklist: Patient identified, Emergency Drugs available, Suction available and Patient being monitored Patient Re-evaluated:Patient Re-evaluated prior to induction Oxygen Delivery Method: Circle System Utilized Preoxygenation: Pre-oxygenation with 100% oxygen Induction Type: IV induction Ventilation: Mask ventilation without difficulty Laryngoscope Size: Glidescope and 3 Grade View: Grade I Tube type: Oral Tube size: 7.0 mm Number of attempts: 1 Airway Equipment and Method: Stylet and Oral airway Placement Confirmation: ETT inserted through vocal cords under direct vision, positive ETCO2 and breath sounds checked- equal and bilateral Secured at: 23 cm Tube secured with: Tape Dental Injury: Teeth and Oropharynx as per pre-operative assessment

## 2024-07-03 NOTE — H&P (Signed)
  Electrophysiology Office Note:   Date:  07/03/2024  ID:  Narelle Schoening, DOB May 11, 1946, MRN [id number]  Primary Cardiologist: None Primary Heart Failure: None Electrophysiologist: Durinda Buzzelli Gladis Norton, MD      History of Present Illness:   Aamirah Salmi is a 78 y.o. female with h/o atrial fibrillation, hyperlipidemia, hypothyroidism seen today for routine electrophysiology followup.   Since last being seen in our clinic the patient reports doing about the same.  She continues to feel fatigued and mildly short of breath.  She thinks that the amiodarone  is making her feel fatigued.  She continues to have palpitations consistent with her atrial fibrillation.  Plan AF ablatoin today.  EP Information / Studies Reviewed:    EKG is ordered today. Personal review as below. Sinus rhythm  Risk Assessment/Calculations:    CHA2DS2-VASc Score = 5   This indicates a 7.2% annual risk of stroke. The patient's score is based upon: CHF History: 0 HTN History: 1 Diabetes History: 0 Stroke History: 0 Vascular Disease History: 1 (aoritc atherosclerosis) Age Score: 2 Gender Score: 1            Physical Exam:   VS:  BP (!) 160/68   Pulse 70   Temp 98.4 F (36.9 C) (Oral)   Resp 16   Ht 5' 9 (1.753 m)   Wt 78 kg   SpO2 98%   BMI 25.40 kg/m    Wt Readings from Last 3 Encounters:  07/03/24 78 kg  06/04/24 76.2 kg  05/19/24 81.2 kg    GEN: No acute distress.   Neck: No JVD Cardiac: RRR, no murmurs, rubs, or gallops.  Respiratory: normal BS bilaterally. GI: Soft, nontender, non-distended  MS: No edema; No deformity. Neuro:  Nonfocal  Skin: warm and dry, Psych: Normal affect    ASSESSMENT AND PLAN:    1.  Persistent atrial fibrillation: Anber Mckiver has presented today for surgery, with the diagnosis of AF.  The various methods of treatment have been discussed with the patient and family. After consideration of risks, benefits and other options for treatment, the patient has  consented to  Procedure(s): Catheter ablation as a surgical intervention .  Risks include but not limited to complete heart block, stroke, esophageal damage, nerve damage, bleeding, vascular damage, tamponade, perforation, MI, and death. The patient's history has been reviewed, patient examined, no change in status, stable for surgery.  I have reviewed the patient's chart and labs.  Questions were answered to the patient's satisfaction.    Denny Mccree Norton, MD 07/03/2024 11:02 AM

## 2024-07-03 NOTE — Transfer of Care (Signed)
 Immediate Anesthesia Transfer of Care Note  Patient: Rebecca Carter  Procedure(s) Performed: ATRIAL FIBRILLATION ABLATION  Patient Location: PACU and Cath Lab  Anesthesia Type:General  Level of Consciousness: awake  Airway & Oxygen Therapy: Patient Spontanous Breathing and Patient connected to nasal cannula oxygen  Post-op Assessment: Report given to RN and Post -op Vital signs reviewed and stable  Post vital signs: stable  Last Vitals:  Vitals Value Taken Time  BP 103/45 07/03/24 12:35  Temp    Pulse 59 07/03/24 12:38  Resp 19 07/03/24 12:38  SpO2 100 % 07/03/24 12:38  Vitals shown include unfiled device data.  Last Pain:  Vitals:   07/03/24 0959  TempSrc: Oral         Complications: There were no known notable events for this encounter.

## 2024-07-03 NOTE — Discharge Instructions (Signed)

## 2024-07-03 NOTE — Anesthesia Preprocedure Evaluation (Signed)
 Anesthesia Evaluation  Patient identified by MRN, date of birth, ID band Patient awake    Reviewed: Allergy & Precautions, NPO status , Patient's Chart, lab work & pertinent test results  Airway Mallampati: II  TM Distance: >3 FB Neck ROM: Full    Dental  (+) Teeth Intact, Dental Advisory Given   Pulmonary sleep apnea and Continuous Positive Airway Pressure Ventilation    Pulmonary exam normal breath sounds clear to auscultation       Cardiovascular hypertension, Pt. on medications Normal cardiovascular exam+ dysrhythmias Atrial Fibrillation  Rhythm:Regular Rate:Normal     Neuro/Psych  PSYCHIATRIC DISORDERS Anxiety Depression    negative neurological ROS     GI/Hepatic Neg liver ROS,GERD  Medicated,,  Endo/Other  Hypothyroidism    Renal/GU Renal InsufficiencyRenal disease     Musculoskeletal  (+) Arthritis ,    Abdominal   Peds  Hematology  (+) Blood dyscrasia (Xarelto )   Anesthesia Other Findings Day of surgery medications reviewed with the patient.  Reproductive/Obstetrics                              Anesthesia Physical Anesthesia Plan  ASA: 3  Anesthesia Plan: General   Post-op Pain Management: Tylenol  PO (pre-op)*   Induction: Intravenous  PONV Risk Score and Plan: 3 and Dexamethasone  and Ondansetron   Airway Management Planned: Oral ETT  Additional Equipment:   Intra-op Plan:   Post-operative Plan: Extubation in OR  Informed Consent: I have reviewed the patients History and Physical, chart, labs and discussed the procedure including the risks, benefits and alternatives for the proposed anesthesia with the patient or authorized representative who has indicated his/her understanding and acceptance.     Dental advisory given  Plan Discussed with: CRNA  Anesthesia Plan Comments:         Anesthesia Quick Evaluation

## 2024-07-04 ENCOUNTER — Telehealth (HOSPITAL_COMMUNITY): Payer: Self-pay

## 2024-07-04 MED FILL — Fentanyl Citrate Preservative Free (PF) Inj 100 MCG/2ML: INTRAMUSCULAR | Qty: 2 | Status: AC

## 2024-07-04 NOTE — Telephone Encounter (Signed)
 Spoke with patient to complete post procedure follow up call.  Patient reports no complications with groin sites.   Instructions reviewed with patient:  Remove large bandage at puncture site after 24 hours. It is normal to have bruising, tenderness, mild swelling, and a pea or marble sized lump/knot at the groin site which can take up to three months to resolve.  Get help right away if you notice sudden swelling at the puncture site.  Check your puncture site every day for signs of infection: fever, redness, swelling, pus drainage, warmth, foul odor or excessive pain. If this occurs, please call the office at [phone], to speak with the nurse. Get help right away if your puncture site is bleeding and the bleeding does not stop after applying firm pressure to the area.  You may continue to have skipped beats/ atrial fibrillation during the first several months after your procedure.  It is very important not to miss any doses of your blood thinner Xarelto . You will follow up with the Afib clinic on 08/01/24 and follow up with the Afib clinic on 10/01/24.   Patient verbalized understanding to all instructions provided.

## 2024-07-05 NOTE — Anesthesia Postprocedure Evaluation (Signed)
 Anesthesia Post Note  Patient: Rebecca Carter  Procedure(s) Performed: ATRIAL FIBRILLATION ABLATION     Patient location during evaluation: Cath Lab Anesthesia Type: General Level of consciousness: awake and alert Pain management: pain level controlled Vital Signs Assessment: post-procedure vital signs reviewed and stable Respiratory status: spontaneous breathing, nonlabored ventilation, respiratory function stable and patient connected to nasal cannula oxygen Cardiovascular status: blood pressure returned to baseline and stable Postop Assessment: no apparent nausea or vomiting Anesthetic complications: no   There were no known notable events for this encounter.  Last Vitals:  Vitals:   07/03/24 1500 07/03/24 1600  BP: 120/60 (!) 115/55  Pulse: 70 73  Resp: 17 15  Temp:    SpO2: 94% 95%    Last Pain:  Vitals:   07/03/24 1315  TempSrc:   PainSc: 0-No pain   Pain Goal:                   [name]

## 2024-07-07 NOTE — Telephone Encounter (Signed)
 Spoke to pt and apologized for the delay.  Pt did speak to Dr. Inocencio post ablation last week and agreed to stop Amiodarone .  She appreciates the follow up call.

## 2024-07-09 ENCOUNTER — Encounter: Payer: Self-pay | Admitting: Emergency Medicine

## 2024-07-10 DIAGNOSIS — J3089 Other allergic rhinitis: Secondary | ICD-10-CM | POA: Diagnosis not present

## 2024-07-10 NOTE — Progress Notes (Signed)
 VIALS MADE 07-10-24

## 2024-07-11 DIAGNOSIS — J301 Allergic rhinitis due to pollen: Secondary | ICD-10-CM | POA: Diagnosis not present

## 2024-07-15 ENCOUNTER — Encounter: Payer: Self-pay | Admitting: Internal Medicine

## 2024-07-15 ENCOUNTER — Ambulatory Visit (INDEPENDENT_AMBULATORY_CARE_PROVIDER_SITE_OTHER): Admitting: Internal Medicine

## 2024-07-15 VITALS — BP 128/56 | HR 74 | Temp 97.8°F | Resp 17

## 2024-07-15 DIAGNOSIS — J3089 Other allergic rhinitis: Secondary | ICD-10-CM | POA: Diagnosis not present

## 2024-07-15 DIAGNOSIS — I10 Essential (primary) hypertension: Secondary | ICD-10-CM

## 2024-07-15 DIAGNOSIS — G4733 Obstructive sleep apnea (adult) (pediatric): Secondary | ICD-10-CM

## 2024-07-15 DIAGNOSIS — I4819 Other persistent atrial fibrillation: Secondary | ICD-10-CM

## 2024-07-15 NOTE — Patient Instructions (Signed)
 Allergic rhinitis Chronic allergic rhinitis with worsening symptoms post-discontinuation of allergy injections. Controlled with Allegra  and Astelin , but breakthrough symptoms persist. Discussed resuming allergy injections to reduce antihistamine use and improve control. Considered Rush immunotherapy for expedited treatment. No contraindications for resuming injections. Allergy injections have 80% success rate if completed for 3-5 years. - Order blood work to recheck allergy status. - Plan to restart allergy injections (will combine any new sensitizations with old testing)  - Prescribe Neffy and allergy action plan - Continue Allegra  180mg  daily  - Continue Astelin  1 spray per nostril twice daily as needed   Start allergy injections. Let us  know if you want to start via RUSH or standard build up  Had a detailed discussion with patient/family that clinical history is suggestive of allergic rhinitis, and may benefit from allergy immunotherapy (AIT). Discussed in detail regarding the dosing, schedule, side effects (mild to moderate local allergic reaction and rarely systemic allergic reactions including anaphylaxis/death), alternatives and benefits (significant improvement in nasal symptoms, seasonal flares of asthma) of immunotherapy with the patient. There is significant time commitment involved with allergy shots, which includes weekly immunotherapy injections for first 9-12 months and then biweekly to monthly injections for 3-5 years. Clinical response is often delayed and patient may not see an improvement for 6-12 months. Consent was signed. I have prescribed epinephrine  injectable and demonstrated proper use. For mild symptoms you can take over the counter antihistamines such as Benadryl and monitor symptoms closely. If symptoms worsen or if you have severe symptoms including breathing issues, throat closure, significant swelling, whole body hives, severe diarrhea and vomiting, lightheadedness then  inject epinephrine  and seek immediate medical care afterwards. Action plan given.   Atrial fibrillation Managed with amiodarone . Preparing for fourth ablation to potentially discontinue amiodarone . No beta blockers used currently   Hypertension Managed with losartan . No concerns related to allergy treatment.  OSA on CPAP  Continue CPAP use   Allergic rhinitis follow-up Follow-up plan discussed for management and potential resumption of allergy injections. - Call her with blood work results to discuss starting allergy injections. - Provide information on standard and Rush immunotherapy options. - Schedule follow-up appointments as needed based on treatment decisions.  Thank you so much for letting me partake in your care today.  Don't hesitate to reach out if you have any additional concerns!  Orelia Binet, MD  [facility] [place], [place]

## 2024-07-15 NOTE — Progress Notes (Unsigned)
 FOLLOW UP Date of Service/Encounter:  07/17/24  Subjective:  Rebecca Carter (DOB: 02/14/1946) is a 78 y.o. female who [facility] in re-evaluation of the following: Allergic rhinitis History obtained from: chart review and patient.  For Review, LV was on 05/19/2024 with Dr. Lorin seen for routine follow-up. See below for summary of history and diagnostics.   Today presents for follow-up. Discussed the use of AI scribe software for clinical note transcription with the patient, who gave verbal consent to proceed.  History of Present Illness Rebecca Carter is a 78 year old female who presents for allergy shot administration.  Allergen immunotherapy administration - Present for administration of traditional allergy shots - Inquired about increasing frequency of injections to twice weekly until reaching the red vial  Atrial fibrillation and cardiac ablation - History of atrial fibrillation - Underwent four cardiac ablations, with the most recent procedure revealing no further areas to ablate - Discontinued amiodarone  two weeks ago after over a year of use due to intolerance - Concerned about potential recurrence of atrial fibrillation     All medications reviewed by clinical staff and updated in chart. No new pertinent medical or surgical history except as noted in HPI.  ROS: All others negative except as noted per HPI.   Objective:  BP (!) 128/56   Pulse 74   Temp 97.8 F (36.6 C) (Temporal)   Resp 17   SpO2 96%  There is no height or weight on file to calculate BMI. Physical Exam: General Appearance:  Alert, cooperative, no distress, appears stated age  Head:  Normocephalic, without obvious abnormality, atraumatic  Eyes:  Conjunctiva clear, EOM's intact  Ears EACs normal bilaterally  Nose: Nares normal, erythematous nasal mucosa, hypertrophic turbinates, no visible anterior polyps, and septum midline  Throat: Lips, tongue  normal; teeth and gums normal, + cobblestoning  Neck: Supple, symmetrical  Lungs:   clear to auscultation bilaterally, Respirations unlabored, no coughing  Heart:  regular rate and rhythm and no murmur, Appears well perfused  Extremities: No edema  Skin: Skin color, texture, turgor normal and no rashes or lesions on visualized portions of skin  Neurologic: No gross deficits   Labs:  Lab Orders  No laboratory test(s) ordered today     Assessment/Plan   Patient Instructions  Allergic rhinitis Chronic allergic rhinitis with worsening symptoms post-discontinuation of allergy injections. Controlled with Allegra  and Astelin , but breakthrough symptoms persist. Discussed resuming allergy injections to reduce antihistamine use and improve control. Considered Rush immunotherapy for expedited treatment. No contraindications for resuming injections. Allergy injections have 80% success rate if completed for 3-5 years. - Given history of atrial fibrillation we will switch from rush to standard buildup - Carry Neffy  and allergy action plan - Continue Allegra  180mg  daily  - Continue Astelin  1 spray per nostril twice daily as needed   Start allergy injections. Let us  know if you want to start via RUSH or standard build up  Had a detailed discussion with patient/family that clinical history is suggestive of allergic rhinitis, and may benefit from allergy immunotherapy (AIT). Discussed in detail regarding the dosing, schedule, side effects (mild to moderate local allergic reaction and rarely systemic allergic reactions including anaphylaxis/death), alternatives and benefits (significant improvement in nasal symptoms, seasonal flares of asthma) of immunotherapy with the patient. There is significant time commitment involved with allergy shots, which includes weekly immunotherapy injections for first 9-12 months and then biweekly to monthly injections for 3-5 years. Clinical  response is often delayed and patient  may not see an improvement for 6-12 months. Consent was signed. I have prescribed epinephrine  injectable and demonstrated proper use. For mild symptoms you can take over the counter antihistamines such as Benadryl and monitor symptoms closely. If symptoms worsen or if you have severe symptoms including breathing issues, throat closure, significant swelling, whole body hives, severe diarrhea and vomiting, lightheadedness then inject epinephrine  and seek immediate medical care afterwards. Action plan given.   Atrial fibrillation Managed with amiodarone . Preparing for fourth ablation to potentially discontinue amiodarone . No beta blockers used currently   Hypertension Managed with losartan . No concerns related to allergy treatment.  OSA on CPAP  Continue CPAP use   Follow-up; in 1 week for initial injection appointment Follow-up: In 6 months in clinic Thank you so much for letting me partake in your care today.  Don't hesitate to reach out if you have any additional concerns!  Hargis Springer, MD  [facility] [place] [place]  Other:    Thank you so much for letting me partake in your care today.  Don't hesitate to reach out if you have any additional concerns!  Hargis Springer, MD  [facility] [place], [place]

## 2024-07-22 ENCOUNTER — Ambulatory Visit (INDEPENDENT_AMBULATORY_CARE_PROVIDER_SITE_OTHER)

## 2024-07-22 DIAGNOSIS — J309 Allergic rhinitis, unspecified: Secondary | ICD-10-CM | POA: Diagnosis not present

## 2024-07-22 NOTE — Progress Notes (Signed)
 Immunotherapy   Patient Details  Name: Rebecca Carter MRN: [id number] Date of Birth: April 01, 1946  07/22/2024  Rebecca Carter started injections for  Blue 1:100,000 (G-W-T and DM-C) Following schedule: B  Frequency: 1-2 x week Epi-Pen:Epi-Pen Available  Consent signed and patient instructions given. No problems after 30 minute wait.   Rebecca Carter 07/22/2024, 3:00 PM

## 2024-07-25 ENCOUNTER — Ambulatory Visit (INDEPENDENT_AMBULATORY_CARE_PROVIDER_SITE_OTHER): Payer: Self-pay

## 2024-07-25 DIAGNOSIS — J309 Allergic rhinitis, unspecified: Secondary | ICD-10-CM

## 2024-07-28 ENCOUNTER — Ambulatory Visit (INDEPENDENT_AMBULATORY_CARE_PROVIDER_SITE_OTHER)

## 2024-07-28 DIAGNOSIS — J309 Allergic rhinitis, unspecified: Secondary | ICD-10-CM | POA: Diagnosis not present

## 2024-07-29 ENCOUNTER — Ambulatory Visit: Admitting: Internal Medicine

## 2024-07-31 ENCOUNTER — Ambulatory Visit (INDEPENDENT_AMBULATORY_CARE_PROVIDER_SITE_OTHER)

## 2024-07-31 DIAGNOSIS — J309 Allergic rhinitis, unspecified: Secondary | ICD-10-CM

## 2024-08-01 ENCOUNTER — Ambulatory Visit (HOSPITAL_COMMUNITY)
Admission: RE | Admit: 2024-08-01 | Discharge: 2024-08-01 | Disposition: A | Discharge status: Home / Self Care | Source: Ambulatory Visit | Attending: Physician Assistant | Admitting: Physician Assistant

## 2024-08-01 VITALS — BP 124/62 | HR 70 | Ht 69.0 in | Wt 177.2 lb

## 2024-08-01 DIAGNOSIS — D6869 Other thrombophilia: Secondary | ICD-10-CM

## 2024-08-01 DIAGNOSIS — I4819 Other persistent atrial fibrillation: Secondary | ICD-10-CM

## 2024-08-01 DIAGNOSIS — I4891 Unspecified atrial fibrillation: Secondary | ICD-10-CM

## 2024-08-01 MED ORDER — DILTIAZEM HCL 30 MG PO TABS: 30.0000 mg | ORAL_TABLET | ORAL | 2 refills | Status: DC | PRN

## 2024-08-01 NOTE — Progress Notes (Signed)
 Primary Care Physician: Teresa Thersia Jansky, MD Primary Cardiologist: Dr Meldon Primary Electrophysiologist: Dr Inocencio Referring Physician: Dr Inocencio Slater Rebecca Carter is a 78 y.o. female with a history of persistent atrial fibrillation, atrial flutter, HLD, HTN, aortic atherosclerosis, hypothyroidism who presents for follow up in the [facility]. Patient is on Xarelto for a CHADS2VASC score of 4. She had an afib ablation in 2016 and an afib/flutter ablation by Dr Meldon on 12/29/19. Patient continued to have paroxysms of afib and presented to Dr Inocencio for a second opinion on 04/01/20. Patient is s/p dofetilide admission 5/3-04/22/20. She did not require DCCV. She had an increased burden of afib and underwent another ablation with Dr Inocencio on 01/30/23.  Patient was seen at ED 04/06/23 for afib and underwent DCCV. She was started on amiodarone. She underwent EP study with Dr Inocencio on 07/03/24 which showed electrical isolation of the PV and CTI. No additional ablation was performed. Amiodarone was discontinued.   Patient returns for follow up for atrial fibrillation. She does feel like her forceful heart beats have improved off amiodarone. Her groin sites have healed. No bleeding issues on anticoagulation.   Today, she  denies symptoms of chest pain, shortness of breath, orthopnea, PND, lower extremity edema, dizziness, presyncope, syncope, snoring, daytime somnolence, bleeding, or neurologic sequela. The patient is tolerating medications without difficulties and is otherwise without complaint today.    Atrial Fibrillation Risk Factors:  she does have symptoms or diagnosis of sleep apnea. she does not have a history of rheumatic fever. she does not have a history of alcohol use.   Atrial Fibrillation Management history:  Previous antiarrhythmic drugs: Multaq, flecainide, sotalol, dofetilide, amiodarone  Previous cardioversions: 04/06/23 Previous ablations: 2016,  12/29/19, 01/30/23, EP study 07/03/24 Anticoagulation history: Xarelto    Past Medical History:  Diagnosis Date   Allergic rhinitis    Atrial fibrillation (HCC)    GERD (gastroesophageal reflux disease)    Hyperlipidemia    Hypothyroid     Current Outpatient Medications  Medication Sig Dispense Refill   atorvastatin (LIPITOR) 40 MG tablet TAKE ONE TABLET BY MOUTH ONE TIME DAILY 90 tablet 3   azelastine (ASTELIN) 0.1 % nasal spray Place 2 sprays into both nostrils daily as needed for allergies. 30 mL 5   Cholecalciferol (VITAMIN D3) 2000 units capsule Take 2,000 Units by mouth daily.      diltiazem (CARDIZEM) 30 MG tablet Take 30 mg by mouth every 4 (four) hours as needed (AFIB).     EPINEPHrine (NEFFY) 2 MG/0.1ML SOLN Place 1 spray into the nose once as needed for up to 1 dose (for severe allergic reactions). 6 each 1   fexofenadine (ALLEGRA) 180 MG tablet Take 180 mg by mouth daily as needed for allergies.      furosemide (LASIX) 20 MG tablet Take 1 tablet (20 mg total) by mouth daily as needed (swelling). 30 tablet 3   levothyroxine (SYNTHROID) 50 MCG tablet Take 50 mcg by mouth daily before breakfast.     losartan (COZAAR) 50 MG tablet Take 1 tablet (50 mg total) by mouth 2 (two) times daily. 60 tablet 3   pantoprazole (PROTONIX) 40 MG tablet Take 1 tablet by mouth every morning.     potassium chloride (KLOR-CON) 10 MEQ tablet Take 1 tablet (10 mEq total) by mouth 2 (two) times daily. 180 tablet 2   rivaroxaban (XARELTO) 20 MG TABS tablet Take 1 tablet (20 mg total) by mouth daily with supper.  90 tablet 1   No current facility-administered medications for this encounter.    ROS- All systems are reviewed and negative except as per the HPI above.  Physical Exam: Vitals:   08/01/24 1122  BP: 124/62  Pulse: 70  Weight: 80.4 kg  Height: 5' 9 (1.753 m)    GEN: Well nourished, well developed in no acute distress CARDIAC: Regular rate and rhythm with occasional ectopy, no murmurs,  rubs, gallops RESPIRATORY:  Clear to auscultation without rales, wheezing or rhonchi  ABDOMEN: Soft, non-tender, non-distended EXTREMITIES:  No edema; No deformity    Wt Readings from Last 3 Encounters:  08/01/24 80.4 kg  07/03/24 78 kg  06/04/24 76.2 kg    EKG today demonstrates  SR, PACs Vent. rate 70 BPM PR interval 184 ms QRS duration 92 ms QT/QTcB 420/453 ms   Echo 11/02/23 demonstrated   1. Left ventricular ejection fraction, by estimation, is 60 to 65%. The  left ventricle has normal function. The left ventricle has no regional  wall motion abnormalities. Left ventricular diastolic parameters are  consistent with Grade II diastolic dysfunction (pseudonormalization).   2. Right ventricular systolic function is normal. The right ventricular  size is normal. There is mildly elevated pulmonary artery systolic  pressure. The estimated right ventricular systolic pressure is 36.9 mmHg.   3. Left atrial size was severely dilated.   4. The mitral valve is normal in structure. Mild mitral valve  regurgitation. No evidence of mitral stenosis. There is mild prolapse of  of the mitral valve.   5. Tricuspid valve regurgitation is moderate.   6. The aortic valve is bicuspid. Aortic valve regurgitation is not  visualized. No aortic stenosis is present.   7. The inferior vena cava is normal in size with greater than 50%  respiratory variability, suggesting right atrial pressure of 3 mmHg.    Epic records are reviewed at length today   CHA2DS2-VASc Score = 5  The patient's score is based upon: CHF History: 0 HTN History: 1 Diabetes History: 0 Stroke History: 0 Vascular Disease History: 1 (aoritc atherosclerosis) Age Score: 2 Gender Score: 1       ASSESSMENT AND PLAN: Persistent Atrial Fibrillation (ICD10:  I48.19) The patient's CHA2DS2-VASc score is 5, indicating a 7.2% annual risk of stroke.   S/p afib ablation x 3. EP study 07/03/24 with no ablation. Dofetilide  previously ineffective. She did not feel well on amiodarone.  She is in SR today. If she has significant recurrence of her afib can consider Multaq or dofetilide. ? If she would be a candidate for convergent ablation.  Continue Xarelto 20 mg daily Continue diltiazem 30 mg PRN q 4 hours for heart racing.   Secondary Hypercoagulable State (ICD10:  D68.69) The patient is at significant risk for stroke/thromboembolism based upon her CHA2DS2-VASc Score of 5.  Continue Rivaroxaban (Xarelto). No bleeding issues.  HTN Stable on current regimen    Follow up in the AF clinic in 2 months.    Daril Kicks PA-C Afib Clinic [facility] [address] [place], [place] [address] [phone] 08/01/2024 11:37 AM

## 2024-08-04 ENCOUNTER — Ambulatory Visit (INDEPENDENT_AMBULATORY_CARE_PROVIDER_SITE_OTHER): Admitting: *Deleted

## 2024-08-04 DIAGNOSIS — J309 Allergic rhinitis, unspecified: Secondary | ICD-10-CM

## 2024-08-06 ENCOUNTER — Ambulatory Visit (INDEPENDENT_AMBULATORY_CARE_PROVIDER_SITE_OTHER)

## 2024-08-06 DIAGNOSIS — J309 Allergic rhinitis, unspecified: Secondary | ICD-10-CM

## 2024-08-10 ENCOUNTER — Other Ambulatory Visit (HOSPITAL_COMMUNITY): Payer: Self-pay | Admitting: Physician Assistant

## 2024-08-11 ENCOUNTER — Ambulatory Visit

## 2024-08-12 ENCOUNTER — Ambulatory Visit (INDEPENDENT_AMBULATORY_CARE_PROVIDER_SITE_OTHER)

## 2024-08-12 DIAGNOSIS — J309 Allergic rhinitis, unspecified: Secondary | ICD-10-CM

## 2024-08-14 ENCOUNTER — Ambulatory Visit (INDEPENDENT_AMBULATORY_CARE_PROVIDER_SITE_OTHER)

## 2024-08-14 DIAGNOSIS — J309 Allergic rhinitis, unspecified: Secondary | ICD-10-CM | POA: Diagnosis not present

## 2024-08-19 ENCOUNTER — Ambulatory Visit (INDEPENDENT_AMBULATORY_CARE_PROVIDER_SITE_OTHER)

## 2024-08-19 DIAGNOSIS — J309 Allergic rhinitis, unspecified: Secondary | ICD-10-CM

## 2024-08-21 ENCOUNTER — Other Ambulatory Visit (HOSPITAL_COMMUNITY): Payer: Self-pay

## 2024-08-21 MED ORDER — FLUZONE HIGH-DOSE 0.5 ML IM SUSY
0.5000 mL | PREFILLED_SYRINGE | Freq: Once | INTRAMUSCULAR | 0 refills | Status: AC
Start: 2024-08-21 — End: 2024-08-22
  Filled 2024-08-21: qty 0.5, 1d supply, fill #0

## 2024-08-26 ENCOUNTER — Ambulatory Visit (INDEPENDENT_AMBULATORY_CARE_PROVIDER_SITE_OTHER)

## 2024-08-26 DIAGNOSIS — J309 Allergic rhinitis, unspecified: Secondary | ICD-10-CM | POA: Diagnosis not present

## 2024-09-02 ENCOUNTER — Ambulatory Visit (INDEPENDENT_AMBULATORY_CARE_PROVIDER_SITE_OTHER)

## 2024-09-02 DIAGNOSIS — J309 Allergic rhinitis, unspecified: Secondary | ICD-10-CM | POA: Diagnosis not present

## 2024-09-04 ENCOUNTER — Ambulatory Visit (INDEPENDENT_AMBULATORY_CARE_PROVIDER_SITE_OTHER)

## 2024-09-04 DIAGNOSIS — J309 Allergic rhinitis, unspecified: Secondary | ICD-10-CM

## 2024-09-08 ENCOUNTER — Ambulatory Visit (INDEPENDENT_AMBULATORY_CARE_PROVIDER_SITE_OTHER)

## 2024-09-08 DIAGNOSIS — J309 Allergic rhinitis, unspecified: Secondary | ICD-10-CM

## 2024-09-10 ENCOUNTER — Ambulatory Visit (INDEPENDENT_AMBULATORY_CARE_PROVIDER_SITE_OTHER)

## 2024-09-10 DIAGNOSIS — J309 Allergic rhinitis, unspecified: Secondary | ICD-10-CM

## 2024-09-15 ENCOUNTER — Ambulatory Visit (INDEPENDENT_AMBULATORY_CARE_PROVIDER_SITE_OTHER)

## 2024-09-15 DIAGNOSIS — J309 Allergic rhinitis, unspecified: Secondary | ICD-10-CM | POA: Diagnosis not present

## 2024-09-19 ENCOUNTER — Ambulatory Visit (INDEPENDENT_AMBULATORY_CARE_PROVIDER_SITE_OTHER)

## 2024-09-19 DIAGNOSIS — J309 Allergic rhinitis, unspecified: Secondary | ICD-10-CM

## 2024-09-22 ENCOUNTER — Ambulatory Visit (INDEPENDENT_AMBULATORY_CARE_PROVIDER_SITE_OTHER)

## 2024-09-22 DIAGNOSIS — J309 Allergic rhinitis, unspecified: Secondary | ICD-10-CM

## 2024-09-25 ENCOUNTER — Ambulatory Visit (INDEPENDENT_AMBULATORY_CARE_PROVIDER_SITE_OTHER)

## 2024-09-25 DIAGNOSIS — J309 Allergic rhinitis, unspecified: Secondary | ICD-10-CM | POA: Diagnosis not present

## 2024-09-30 ENCOUNTER — Ambulatory Visit (INDEPENDENT_AMBULATORY_CARE_PROVIDER_SITE_OTHER)

## 2024-09-30 DIAGNOSIS — J309 Allergic rhinitis, unspecified: Secondary | ICD-10-CM | POA: Diagnosis not present

## 2024-10-01 ENCOUNTER — Ambulatory Visit (HOSPITAL_COMMUNITY)
Admission: RE | Admit: 2024-10-01 | Discharge: 2024-10-01 | Disposition: A | Discharge status: Home / Self Care | Source: Ambulatory Visit | Attending: Physician Assistant | Admitting: Physician Assistant

## 2024-10-01 VITALS — BP 142/64 | HR 78 | Ht 69.0 in | Wt 173.4 lb

## 2024-10-01 DIAGNOSIS — D6869 Other thrombophilia: Secondary | ICD-10-CM

## 2024-10-01 DIAGNOSIS — I4891 Unspecified atrial fibrillation: Secondary | ICD-10-CM | POA: Diagnosis not present

## 2024-10-01 DIAGNOSIS — I4819 Other persistent atrial fibrillation: Secondary | ICD-10-CM

## 2024-10-01 MED ORDER — BISOPROLOL FUMARATE 2.5 MG PO TABS: 2.5000 mg | ORAL_TABLET | Freq: Every day | ORAL | 3 refills | Status: DC

## 2024-10-01 NOTE — Progress Notes (Signed)
 Primary Care Physician: Teresa Thersia Jansky, MD Primary Cardiologist: Dr Meldon Primary Electrophysiologist: Dr Inocencio Referring Physician: Dr Inocencio Slater Robson is a 78 y.o. female with a history of persistent atrial fibrillation, atrial flutter, HLD, HTN, aortic atherosclerosis, hypothyroidism who presents for follow up in the [facility]. Patient is on Xarelto  for a CHADS2VASC score of 4. She had an afib ablation in 2016 and an afib/flutter ablation by Dr Meldon on 12/29/19. Patient continued to have paroxysms of afib and presented to Dr Inocencio for a second opinion on 04/01/20. Patient is s/p dofetilide  admission 5/3-04/22/20. She did not require DCCV. She had an increased burden of afib and underwent another ablation with Dr Inocencio on 01/30/23.  Patient was seen at ED 04/06/23 for afib and underwent DCCV. She was started on amiodarone . She underwent EP study with Dr Inocencio on 07/03/24 which showed electrical isolation of the PV and CTI. No additional ablation was performed. Amiodarone  was discontinued.   Patient returns for follow up for atrial fibrillation. She is in SR today and feels well. She has brief episodes of afib detected on her smart watch lasting only 10-15 minutes. She is happy with her afib control. She continues to feel forceful heart beats frequently, especially at night.   Today, she  denies symptoms of chest pain, shortness of breath, orthopnea, PND, lower extremity edema, dizziness, presyncope, syncope, snoring, daytime somnolence, bleeding, or neurologic sequela. The patient is tolerating medications without difficulties and is otherwise without complaint today.    Atrial Fibrillation Risk Factors:  she does have symptoms or diagnosis of sleep apnea. she does not have a history of rheumatic fever. she does not have a history of alcohol use.   Atrial Fibrillation Management history:  Previous antiarrhythmic drugs: Multaq, flecainide ,  sotalol, dofetilide , amiodarone   Previous cardioversions: 04/06/23 Previous ablations: 2016, 12/29/19, 01/30/23, EP study 07/03/24 Anticoagulation history: Xarelto     Past Medical History:  Diagnosis Date   Allergic rhinitis    Atrial fibrillation (HCC)    GERD (gastroesophageal reflux disease)    Hyperlipidemia    Hypothyroid     Current Outpatient Medications  Medication Sig Dispense Refill   atorvastatin  (LIPITOR) 40 MG tablet TAKE ONE TABLET BY MOUTH ONE TIME DAILY 90 tablet 3   azelastine  (ASTELIN ) 0.1 % nasal spray Place 2 sprays into both nostrils daily as needed for allergies. (Patient taking differently: Place 1 spray into both nostrils 2 (two) times daily as needed for allergies.) 30 mL 5   Cholecalciferol  (VITAMIN D3) 2000 units capsule Take 2,000 Units by mouth daily.      diltiazem  (CARDIZEM ) 30 MG tablet Take 1 tablet (30 mg total) by mouth every 4 (four) hours as needed (AFIB). 45 tablet 2   EPINEPHrine  (NEFFY ) 2 MG/0.1ML SOLN Place 1 spray into the nose once as needed for up to 1 dose (for severe allergic reactions). 6 each 1   fexofenadine  (ALLEGRA ) 180 MG tablet Take 180 mg by mouth daily as needed for allergies.      furosemide  (LASIX ) 20 MG tablet Take 1 tablet (20 mg total) by mouth daily as needed (swelling). 30 tablet 3   levothyroxine  (SYNTHROID ) 50 MCG tablet Take 50 mcg by mouth daily before breakfast.     losartan  (COZAAR ) 50 MG tablet TAKE ONE TABLET BY MOUTH TWICE A DAY 60 tablet 5   pantoprazole  (PROTONIX ) 40 MG tablet Take 1 tablet by mouth every morning.     potassium chloride  (KLOR-CON ) 10  MEQ tablet Take 1 tablet (10 mEq total) by mouth 2 (two) times daily. 180 tablet 2   rivaroxaban  (XARELTO ) 20 MG TABS tablet Take 1 tablet (20 mg total) by mouth daily with supper. 90 tablet 1   No current facility-administered medications for this encounter.    ROS- All systems are reviewed and negative except as per the HPI above.  Physical Exam: Vitals:    10/01/24 1117  BP: (!) 142/64  Pulse: 78  Weight: 78.7 kg  Height: 5' 9 (1.753 m)    GEN: Well nourished, well developed in no acute distress CARDIAC: Regular rate and rhythm with occasional ectopy, no murmurs, rubs, gallops RESPIRATORY:  Clear to auscultation without rales, wheezing or rhonchi  ABDOMEN: Soft, non-tender, non-distended EXTREMITIES:  No edema; No deformity    Wt Readings from Last 3 Encounters:  10/01/24 78.7 kg  08/01/24 80.4 kg  07/03/24 78 kg    EKG today demonstrates  SR, PAC Vent. rate 78 BPM PR interval 174 ms QRS duration 86 ms QT/QTcB 370/421 ms   Echo 11/02/23 demonstrated   1. Left ventricular ejection fraction, by estimation, is 60 to 65%. The  left ventricle has normal function. The left ventricle has no regional  wall motion abnormalities. Left ventricular diastolic parameters are  consistent with Grade II diastolic dysfunction (pseudonormalization).   2. Right ventricular systolic function is normal. The right ventricular  size is normal. There is mildly elevated pulmonary artery systolic  pressure. The estimated right ventricular systolic pressure is 36.9 mmHg.   3. Left atrial size was severely dilated.   4. The mitral valve is normal in structure. Mild mitral valve  regurgitation. No evidence of mitral stenosis. There is mild prolapse of  of the mitral valve.   5. Tricuspid valve regurgitation is moderate.   6. The aortic valve is bicuspid. Aortic valve regurgitation is not  visualized. No aortic stenosis is present.   7. The inferior vena cava is normal in size with greater than 50%  respiratory variability, suggesting right atrial pressure of 3 mmHg.    Epic records are reviewed at length today   CHA2DS2-VASc Score = 5  The patient's score is based upon: CHF History: 0 HTN History: 1 Diabetes History: 0 Stroke History: 0 Vascular Disease History: 1 (aoritc atherosclerosis) Age Score: 2 Gender Score: 1       ASSESSMENT  AND PLAN: Persistent Atrial Fibrillation (ICD10:  I48.19) The patient's CHA2DS2-VASc score is 5, indicating a 7.2% annual risk of stroke.   S/p afib ablation x 3. EP study 07/03/24 with no ablation. She has been on several AADs. Dofetilide  previously ineffective, did not feel well on amiodarone .  She is in SR today, has brief episodes detected on her smart watch. She also has frequent PACs and PVCs on her strips. Suspect these could be the forceful heartbeat that she feels.  Will start bisoprolol 2.5 mg daily (did not tolerate metoprolol  due to insomnia)  Continue Xarelto  20 mg daily Continue diltiazem  30 mg PRN q 4 hours for heart racing.   Secondary Hypercoagulable State (ICD10:  D68.69) The patient is at significant risk for stroke/thromboembolism based upon her CHA2DS2-VASc Score of 5.  Continue Rivaroxaban  (Xarelto ). No bleeding issues.   HTN Stable on current regimen   Follow up in the AF clinic in 6 weeks.    Daril Kicks PA-C Afib Clinic [facility] [address] [place], [place] [address] [phone] 10/01/2024 11:37 AM

## 2024-10-01 NOTE — Patient Instructions (Addendum)
 Start Bisoprolol 2.5mg  once a day

## 2024-10-02 ENCOUNTER — Other Ambulatory Visit (HOSPITAL_COMMUNITY): Payer: Self-pay | Admitting: *Deleted

## 2024-10-02 ENCOUNTER — Ambulatory Visit (INDEPENDENT_AMBULATORY_CARE_PROVIDER_SITE_OTHER): Payer: Self-pay

## 2024-10-02 DIAGNOSIS — J309 Allergic rhinitis, unspecified: Secondary | ICD-10-CM

## 2024-10-02 MED ORDER — BISOPROLOL FUMARATE 5 MG PO TABS: 2.5000 mg | ORAL_TABLET | Freq: Every day | ORAL | 3 refills | Status: DC

## 2024-10-06 ENCOUNTER — Ambulatory Visit (INDEPENDENT_AMBULATORY_CARE_PROVIDER_SITE_OTHER)

## 2024-10-06 DIAGNOSIS — J309 Allergic rhinitis, unspecified: Secondary | ICD-10-CM | POA: Diagnosis not present

## 2024-10-08 ENCOUNTER — Ambulatory Visit

## 2024-10-08 DIAGNOSIS — J309 Allergic rhinitis, unspecified: Secondary | ICD-10-CM | POA: Diagnosis not present

## 2024-10-13 ENCOUNTER — Ambulatory Visit

## 2024-10-13 DIAGNOSIS — J309 Allergic rhinitis, unspecified: Secondary | ICD-10-CM

## 2024-10-16 ENCOUNTER — Ambulatory Visit (INDEPENDENT_AMBULATORY_CARE_PROVIDER_SITE_OTHER)

## 2024-10-16 DIAGNOSIS — J309 Allergic rhinitis, unspecified: Secondary | ICD-10-CM

## 2024-10-22 ENCOUNTER — Ambulatory Visit (INDEPENDENT_AMBULATORY_CARE_PROVIDER_SITE_OTHER)

## 2024-10-22 DIAGNOSIS — J302 Other seasonal allergic rhinitis: Secondary | ICD-10-CM | POA: Diagnosis not present

## 2024-10-22 DIAGNOSIS — J309 Allergic rhinitis, unspecified: Secondary | ICD-10-CM

## 2024-10-29 ENCOUNTER — Ambulatory Visit (HOSPITAL_COMMUNITY)
Admission: RE | Admit: 2024-10-29 | Discharge: 2024-10-29 | Disposition: A | Discharge status: Home / Self Care | Source: Ambulatory Visit | Attending: Physician Assistant | Admitting: Physician Assistant

## 2024-10-29 VITALS — BP 142/62 | HR 59 | Ht 69.0 in | Wt 174.2 lb

## 2024-10-29 DIAGNOSIS — D6869 Other thrombophilia: Secondary | ICD-10-CM

## 2024-10-29 DIAGNOSIS — I4891 Unspecified atrial fibrillation: Secondary | ICD-10-CM | POA: Diagnosis not present

## 2024-10-29 DIAGNOSIS — I4819 Other persistent atrial fibrillation: Secondary | ICD-10-CM | POA: Diagnosis not present

## 2024-10-29 MED ORDER — BISOPROLOL FUMARATE 5 MG PO TABS: 5.0000 mg | ORAL_TABLET | Freq: Every day | ORAL | Status: DC

## 2024-10-29 NOTE — Progress Notes (Signed)
 Primary Care Physician: Teresa Thersia Jansky, MD Primary Cardiologist: Dr Meldon Primary Electrophysiologist: Dr Inocencio Referring Physician: Dr Inocencio Slater Robson is a 78 y.o. female with a history of persistent atrial fibrillation, atrial flutter, HLD, HTN, aortic atherosclerosis, hypothyroidism who presents for follow up in the [facility]. Patient is on Xarelto  for a CHADS2VASC score of 4. She had an afib ablation in 2016 and an afib/flutter ablation by Dr Meldon on 12/29/19. Patient continued to have paroxysms of afib and presented to Dr Inocencio for a second opinion on 04/01/20. Patient is s/p dofetilide  admission 5/3-04/22/20. She did not require DCCV. She had an increased burden of afib and underwent another ablation with Dr Inocencio on 01/30/23.  Patient was seen at ED 04/06/23 for afib and underwent DCCV. She was started on amiodarone . She underwent EP study with Dr Inocencio on 07/03/24 which showed electrical isolation of the PV and CTI. No additional ablation was performed. Amiodarone  was discontinued.   Patient returns for follow up for atrial fibrillation. She remains in SR today and feels well. She states her palpitations have improved with the addition of bisoprolol. No bleeding issues on anticoagulation.   Today, she  denies symptoms of chest pain, shortness of breath, orthopnea, PND, lower extremity edema, dizziness, presyncope, syncope, bleeding, or neurologic sequela. The patient is tolerating medications without difficulties and is otherwise without complaint today.    Atrial Fibrillation Risk Factors:  she does have symptoms or diagnosis of sleep apnea. she does not have a history of rheumatic fever. she does not have a history of alcohol use.   Atrial Fibrillation Management history:  Previous antiarrhythmic drugs: Multaq, flecainide , sotalol, dofetilide , amiodarone   Previous cardioversions: 04/06/23 Previous ablations: 2016, 12/29/19,  01/30/23, EP study 07/03/24 Anticoagulation history: Xarelto     Past Medical History:  Diagnosis Date   Allergic rhinitis    Atrial fibrillation (HCC)    GERD (gastroesophageal reflux disease)    Hyperlipidemia    Hypothyroid     Current Outpatient Medications  Medication Sig Dispense Refill   atorvastatin  (LIPITOR) 40 MG tablet TAKE ONE TABLET BY MOUTH ONE TIME DAILY 90 tablet 3   azelastine  (ASTELIN ) 0.1 % nasal spray Place 2 sprays into both nostrils daily as needed for allergies. (Patient taking differently: Place 1 spray into both nostrils 2 (two) times daily as needed for allergies.) 30 mL 5   Cholecalciferol  (VITAMIN D3) 2000 units capsule Take 2,000 Units by mouth daily.      diltiazem  (CARDIZEM ) 30 MG tablet Take 1 tablet (30 mg total) by mouth every 4 (four) hours as needed (AFIB). 45 tablet 2   EPINEPHrine  (NEFFY ) 2 MG/0.1ML SOLN Place 1 spray into the nose once as needed for up to 1 dose (for severe allergic reactions). 6 each 1   fexofenadine  (ALLEGRA ) 180 MG tablet Take 180 mg by mouth daily as needed for allergies.  (Patient taking differently: Take 180 mg by mouth as needed for allergies.)     furosemide  (LASIX ) 20 MG tablet Take 1 tablet (20 mg total) by mouth daily as needed (swelling). (Patient taking differently: Take 20 mg by mouth as needed (swelling).) 30 tablet 3   levothyroxine  (SYNTHROID ) 50 MCG tablet Take 50 mcg by mouth daily before breakfast.     losartan  (COZAAR ) 50 MG tablet TAKE ONE TABLET BY MOUTH TWICE A DAY 60 tablet 5   pantoprazole  (PROTONIX ) 40 MG tablet Take 1 tablet by mouth every morning.     potassium  chloride (KLOR-CON ) 10 MEQ tablet Take 1 tablet (10 mEq total) by mouth 2 (two) times daily. 180 tablet 2   rivaroxaban  (XARELTO ) 20 MG TABS tablet Take 1 tablet (20 mg total) by mouth daily with supper. 90 tablet 1   bisoprolol (ZEBETA) 5 MG tablet Take 1 tablet (5 mg total) by mouth daily.     No current facility-administered medications for this  encounter.    ROS- All systems are reviewed and negative except as per the HPI above.  Physical Exam: Vitals:   10/29/24 1322  BP: (!) 142/62  Pulse: (!) 59  Weight: 79 kg  Height: 5' 9 (1.753 m)    GEN: Well nourished, well developed in no acute distress CARDIAC: Regular rate and rhythm with occasional ectopy, no murmurs, rubs, gallops RESPIRATORY:  Clear to auscultation without rales, wheezing or rhonchi  ABDOMEN: Soft, non-tender, non-distended EXTREMITIES:  No edema; No deformity    Wt Readings from Last 3 Encounters:  10/29/24 79 kg  10/01/24 78.7 kg  08/01/24 80.4 kg    EKG today demonstrates  SB, LAFB Vent. rate 59 BPM PR interval 182 ms QRS duration 88 ms QT/QTcB 438/433 ms   Echo 11/02/23 demonstrated   1. Left ventricular ejection fraction, by estimation, is 60 to 65%. The  left ventricle has normal function. The left ventricle has no regional  wall motion abnormalities. Left ventricular diastolic parameters are  consistent with Grade II diastolic dysfunction (pseudonormalization).   2. Right ventricular systolic function is normal. The right ventricular  size is normal. There is mildly elevated pulmonary artery systolic  pressure. The estimated right ventricular systolic pressure is 36.9 mmHg.   3. Left atrial size was severely dilated.   4. The mitral valve is normal in structure. Mild mitral valve  regurgitation. No evidence of mitral stenosis. There is mild prolapse of  of the mitral valve.   5. Tricuspid valve regurgitation is moderate.   6. The aortic valve is bicuspid. Aortic valve regurgitation is not  visualized. No aortic stenosis is present.   7. The inferior vena cava is normal in size with greater than 50%  respiratory variability, suggesting right atrial pressure of 3 mmHg.    Epic records are reviewed at length today   CHA2DS2-VASc Score = 5  The patient's score is based upon: CHF History: 0 HTN History: 1 Diabetes History:  0 Stroke History: 0 Vascular Disease History: 1 (aoritc atherosclerosis) Age Score: 2 Gender Score: 1       ASSESSMENT AND PLAN: Persistent Atrial Fibrillation (ICD10:  I48.19) The patient's CHA2DS2-VASc score is 5, indicating a 7.2% annual risk of stroke.   S/p afib ablation x 3. EP study 07/03/24 with no ablation. She has been on several AADs. Dofetilide  was previously ineffective and she did not feel well on amiodarone .  Patient is in SR today. Will try increasing bisoprolol to 5 mg daily. Patient to call back in two weeks with update.  Continue Xarelto  20 mg daily  Continue diltiazem  30 mg PRN q 4 hours for heart racing.   Secondary Hypercoagulable State (ICD10:  D68.69) The patient is at significant risk for stroke/thromboembolism based upon her CHA2DS2-VASc Score of 5.  Continue Rivaroxaban  (Xarelto ). No bleeding issues.   HTN Stable on current regimen   Follow up with Dr Inocencio in 6 months. AF clinic in one year.    Daril Kicks PA-C [facility] [address] [place], [place] [address] [phone] 10/29/2024 2:06 PM

## 2024-10-31 ENCOUNTER — Ambulatory Visit (INDEPENDENT_AMBULATORY_CARE_PROVIDER_SITE_OTHER)

## 2024-10-31 DIAGNOSIS — J3089 Other allergic rhinitis: Secondary | ICD-10-CM

## 2024-10-31 DIAGNOSIS — J309 Allergic rhinitis, unspecified: Secondary | ICD-10-CM

## 2024-11-01 ENCOUNTER — Other Ambulatory Visit: Payer: Self-pay | Admitting: Cardiology

## 2024-11-01 DIAGNOSIS — I480 Paroxysmal atrial fibrillation: Secondary | ICD-10-CM

## 2024-11-03 NOTE — Telephone Encounter (Signed)
 Prescription refill request for Xarelto  received.  Indication: a-fib  Last office visit: 06/04/2024 [place] Weight: 79kg Age: 78 Scr: 1.02 (EPIC 06/04/2024) CrCl: 57  Refill sent

## 2024-11-04 ENCOUNTER — Ambulatory Visit (INDEPENDENT_AMBULATORY_CARE_PROVIDER_SITE_OTHER)

## 2024-11-04 DIAGNOSIS — J302 Other seasonal allergic rhinitis: Secondary | ICD-10-CM | POA: Diagnosis not present

## 2024-11-04 DIAGNOSIS — J309 Allergic rhinitis, unspecified: Secondary | ICD-10-CM | POA: Diagnosis not present

## 2024-11-11 ENCOUNTER — Ambulatory Visit

## 2024-11-11 DIAGNOSIS — J309 Allergic rhinitis, unspecified: Secondary | ICD-10-CM | POA: Diagnosis not present

## 2024-11-11 DIAGNOSIS — J302 Other seasonal allergic rhinitis: Secondary | ICD-10-CM | POA: Diagnosis not present

## 2024-11-12 ENCOUNTER — Ambulatory Visit (HOSPITAL_COMMUNITY): Admitting: Physician Assistant

## 2024-11-18 ENCOUNTER — Ambulatory Visit

## 2024-11-18 DIAGNOSIS — J309 Allergic rhinitis, unspecified: Secondary | ICD-10-CM

## 2024-11-18 DIAGNOSIS — J302 Other seasonal allergic rhinitis: Secondary | ICD-10-CM | POA: Diagnosis not present

## 2024-11-24 ENCOUNTER — Ambulatory Visit (INDEPENDENT_AMBULATORY_CARE_PROVIDER_SITE_OTHER)

## 2024-11-24 ENCOUNTER — Telehealth (HOSPITAL_COMMUNITY): Payer: Self-pay | Admitting: *Deleted

## 2024-11-24 DIAGNOSIS — J302 Other seasonal allergic rhinitis: Secondary | ICD-10-CM

## 2024-11-24 DIAGNOSIS — I480 Paroxysmal atrial fibrillation: Secondary | ICD-10-CM

## 2024-11-24 DIAGNOSIS — J309 Allergic rhinitis, unspecified: Secondary | ICD-10-CM

## 2024-11-24 MED ORDER — DILTIAZEM HCL ER COATED BEADS 120 MG PO CP24: 120.0000 mg | ORAL_CAPSULE | Freq: Every day | ORAL | Status: DC

## 2024-11-24 NOTE — Telephone Encounter (Signed)
 Pt stated she did not tolerate the 5mg  dose of bisoprolol  it caused her to feel very weak and dizzy (heart rates were dropping below 50 according to her watch). She self reduced the bisoprolol  to 2.5mg  daily but continues with symptoms. Discussed with Daril Kicks PA will stop bisoprolol  and start cardizem  120mg  daily -- 14 day zio to assess overall burden.

## 2024-11-25 ENCOUNTER — Ambulatory Visit (HOSPITAL_COMMUNITY)
Admission: RE | Admit: 2024-11-25 | Discharge: 2024-11-25 | Disposition: A | Source: Ambulatory Visit | Attending: Physician Assistant

## 2024-11-25 DIAGNOSIS — I480 Paroxysmal atrial fibrillation: Secondary | ICD-10-CM

## 2024-12-02 ENCOUNTER — Ambulatory Visit (INDEPENDENT_AMBULATORY_CARE_PROVIDER_SITE_OTHER)

## 2024-12-02 DIAGNOSIS — J309 Allergic rhinitis, unspecified: Secondary | ICD-10-CM

## 2024-12-02 DIAGNOSIS — J302 Other seasonal allergic rhinitis: Secondary | ICD-10-CM | POA: Diagnosis not present

## 2024-12-08 ENCOUNTER — Ambulatory Visit

## 2024-12-08 DIAGNOSIS — J309 Allergic rhinitis, unspecified: Secondary | ICD-10-CM

## 2024-12-08 DIAGNOSIS — J302 Other seasonal allergic rhinitis: Secondary | ICD-10-CM

## 2024-12-09 ENCOUNTER — Other Ambulatory Visit (HOSPITAL_COMMUNITY): Payer: Self-pay | Admitting: *Deleted

## 2024-12-09 MED ORDER — DILTIAZEM HCL 30 MG PO TABS: 30.0000 mg | ORAL_TABLET | ORAL | 2 refills | Status: AC | PRN

## 2024-12-17 ENCOUNTER — Ambulatory Visit (INDEPENDENT_AMBULATORY_CARE_PROVIDER_SITE_OTHER)

## 2024-12-17 DIAGNOSIS — J3089 Other allergic rhinitis: Secondary | ICD-10-CM

## 2024-12-17 DIAGNOSIS — J309 Allergic rhinitis, unspecified: Secondary | ICD-10-CM

## 2024-12-22 ENCOUNTER — Ambulatory Visit (HOSPITAL_COMMUNITY): Payer: Self-pay | Admitting: Physician Assistant

## 2024-12-22 NOTE — Addendum Note (Signed)
 Encounter addended by: Franchot Glade RAMAN, RN on: 12/22/2024 9:03 AM  Actions taken: Imaging Exam ended

## 2024-12-24 ENCOUNTER — Telehealth (HOSPITAL_COMMUNITY): Payer: Self-pay | Admitting: *Deleted

## 2024-12-24 ENCOUNTER — Ambulatory Visit

## 2024-12-24 DIAGNOSIS — J302 Other seasonal allergic rhinitis: Secondary | ICD-10-CM

## 2024-12-24 MED ORDER — BISOPROLOL FUMARATE 2.5 MG PO TABS: 2.5000 mg | ORAL_TABLET | Freq: Every day | ORAL | 6 refills | Status: DC

## 2024-12-24 NOTE — Telephone Encounter (Signed)
 Patient called in stating after wearing monitor she determined bisoprolol  better controls her breatkhrough than diltiazem  120mg  daily. She has already stopped diltiazem  120mg  and resumed bisoprolol  2.5mg  once a day.  Pt will monitor her burden and call if increases. Discussed above with Daril Kicks PA he is ok with patient current medication regimen.

## 2024-12-25 ENCOUNTER — Other Ambulatory Visit (HOSPITAL_COMMUNITY): Payer: Self-pay | Admitting: *Deleted

## 2024-12-25 MED ORDER — BISOPROLOL FUMARATE 5 MG PO TABS: 2.5000 mg | ORAL_TABLET | Freq: Every day | ORAL | 6 refills | Status: AC

## 2024-12-30 ENCOUNTER — Ambulatory Visit

## 2024-12-30 DIAGNOSIS — J302 Other seasonal allergic rhinitis: Secondary | ICD-10-CM | POA: Diagnosis not present

## 2025-01-05 ENCOUNTER — Ambulatory Visit

## 2025-01-05 DIAGNOSIS — J302 Other seasonal allergic rhinitis: Secondary | ICD-10-CM | POA: Diagnosis not present

## 2025-01-07 NOTE — Progress Notes (Signed)
 VIALS MADE ON 01/07/25

## 2025-01-20 ENCOUNTER — Ambulatory Visit

## 2025-01-20 DIAGNOSIS — J302 Other seasonal allergic rhinitis: Secondary | ICD-10-CM

## 2025-03-02 ENCOUNTER — Ambulatory Visit: Admitting: Cardiology

## 2025-05-14 ENCOUNTER — Ambulatory Visit: Admitting: Cardiology
